# Patient Record
Sex: Male | Born: 1945 | Race: White | Hispanic: No | Marital: Married | Smoking: Never smoker
Health system: Southern US, Community
[De-identification: ages and names within clinical notes are randomized; demographics above are authoritative.]

## PROBLEM LIST (undated history)

## (undated) DIAGNOSIS — E785 Hyperlipidemia, unspecified: Secondary | ICD-10-CM

## (undated) DIAGNOSIS — R011 Cardiac murmur, unspecified: Secondary | ICD-10-CM

## (undated) DIAGNOSIS — Z87442 Personal history of urinary calculi: Secondary | ICD-10-CM

## (undated) DIAGNOSIS — M1990 Unspecified osteoarthritis, unspecified site: Secondary | ICD-10-CM

## (undated) DIAGNOSIS — G4730 Sleep apnea, unspecified: Secondary | ICD-10-CM

## (undated) DIAGNOSIS — N529 Male erectile dysfunction, unspecified: Secondary | ICD-10-CM

## (undated) DIAGNOSIS — M67919 Unspecified disorder of synovium and tendon, unspecified shoulder: Secondary | ICD-10-CM

## (undated) DIAGNOSIS — K219 Gastro-esophageal reflux disease without esophagitis: Secondary | ICD-10-CM

## (undated) DIAGNOSIS — E7800 Pure hypercholesterolemia, unspecified: Secondary | ICD-10-CM

## (undated) DIAGNOSIS — I10 Essential (primary) hypertension: Secondary | ICD-10-CM

## (undated) DIAGNOSIS — N200 Calculus of kidney: Secondary | ICD-10-CM

## (undated) DIAGNOSIS — M545 Low back pain, unspecified: Secondary | ICD-10-CM

## (undated) DIAGNOSIS — E669 Obesity, unspecified: Secondary | ICD-10-CM

## (undated) DIAGNOSIS — J45909 Unspecified asthma, uncomplicated: Secondary | ICD-10-CM

## (undated) DIAGNOSIS — R519 Headache, unspecified: Secondary | ICD-10-CM

## (undated) DIAGNOSIS — E119 Type 2 diabetes mellitus without complications: Secondary | ICD-10-CM

## (undated) DIAGNOSIS — R51 Headache: Secondary | ICD-10-CM

## (undated) DIAGNOSIS — E1149 Type 2 diabetes mellitus with other diabetic neurological complication: Secondary | ICD-10-CM

## (undated) DIAGNOSIS — R0600 Dyspnea, unspecified: Secondary | ICD-10-CM

## (undated) DIAGNOSIS — C801 Malignant (primary) neoplasm, unspecified: Secondary | ICD-10-CM

## (undated) HISTORY — DX: Low back pain, unspecified: M54.50

## (undated) HISTORY — DX: Obesity, unspecified: E66.9

## (undated) HISTORY — DX: Calculus of kidney: N20.0

## (undated) HISTORY — PX: KNEE ARTHROSCOPY: SUR90

## (undated) HISTORY — PX: PILONIDAL CYST EXCISION: SHX744

## (undated) HISTORY — DX: Low back pain: M54.5

## (undated) HISTORY — PX: COLONOSCOPY: SHX174

## (undated) HISTORY — DX: Type 2 diabetes mellitus with other diabetic neurological complication: E11.49

---

## 2002-01-30 ENCOUNTER — Ambulatory Visit (HOSPITAL_COMMUNITY): Admission: RE | Admit: 2002-01-30 | Discharge: 2002-01-30 | Payer: Self-pay | Admitting: Gastroenterology

## 2012-02-21 DIAGNOSIS — M899 Disorder of bone, unspecified: Secondary | ICD-10-CM | POA: Insufficient documentation

## 2012-02-21 DIAGNOSIS — R109 Unspecified abdominal pain: Secondary | ICD-10-CM | POA: Insufficient documentation

## 2012-02-21 DIAGNOSIS — N200 Calculus of kidney: Secondary | ICD-10-CM | POA: Insufficient documentation

## 2012-02-21 DIAGNOSIS — E119 Type 2 diabetes mellitus without complications: Secondary | ICD-10-CM | POA: Insufficient documentation

## 2012-02-21 DIAGNOSIS — R319 Hematuria, unspecified: Secondary | ICD-10-CM | POA: Insufficient documentation

## 2012-02-22 ENCOUNTER — Emergency Department (HOSPITAL_COMMUNITY)
Admission: EM | Admit: 2012-02-22 | Discharge: 2012-02-22 | Disposition: A | Discharge status: Home / Self Care | Payer: 59 | Attending: Emergency Medicine | Admitting: Emergency Medicine

## 2012-02-22 ENCOUNTER — Emergency Department (HOSPITAL_COMMUNITY): Payer: 59

## 2012-02-22 ENCOUNTER — Encounter (HOSPITAL_COMMUNITY): Payer: Self-pay | Admitting: Family Medicine

## 2012-02-22 DIAGNOSIS — N200 Calculus of kidney: Secondary | ICD-10-CM

## 2012-02-22 DIAGNOSIS — M899 Disorder of bone, unspecified: Secondary | ICD-10-CM

## 2012-02-22 LAB — URINE MICROSCOPIC-ADD ON

## 2012-02-22 LAB — URINALYSIS, ROUTINE W REFLEX MICROSCOPIC
Nitrite: POSITIVE — AB
Specific Gravity, Urine: 1.037 — ABNORMAL HIGH (ref 1.005–1.030)
Urobilinogen, UA: 1 mg/dL (ref 0.0–1.0)

## 2012-02-22 LAB — POCT I-STAT, CHEM 8
Calcium, Ion: 1.22 mmol/L (ref 1.12–1.32)
Hemoglobin: 13.3 g/dL (ref 13.0–17.0)
Sodium: 141 mEq/L (ref 135–145)
TCO2: 22 mmol/L (ref 0–100)

## 2012-02-22 LAB — GLUCOSE, CAPILLARY: Glucose-Capillary: 196 mg/dL — ABNORMAL HIGH (ref 70–99)

## 2012-02-22 MED ORDER — OXYCODONE-ACETAMINOPHEN 5-325 MG PO TABS
1.0000 | ORAL_TABLET | Freq: Once | ORAL | Status: AC
  Administered 2012-02-22: 1 via ORAL
  Filled 2012-02-22: qty 1

## 2012-02-22 MED ORDER — TAMSULOSIN HCL 0.4 MG PO CAPS: 0.4000 mg | ORAL_CAPSULE | Freq: Every day | ORAL | Status: DC

## 2012-02-22 MED ORDER — OXYCODONE-ACETAMINOPHEN 5-325 MG PO TABS: 1.0000 | ORAL_TABLET | Freq: Four times a day (QID) | ORAL | Status: AC | PRN

## 2012-02-22 NOTE — ED Notes (Signed)
Patient states he has blood in his urine since 8:30pm.

## 2012-02-22 NOTE — ED Notes (Signed)
Patient states that he was sitting on the couch watching TV and started having a sharp pain in his right lower pain. Went to the bathroom and saw blood in his urine. No history of kidney stone.

## 2012-02-22 NOTE — ED Provider Notes (Signed)
Medical screening examination/treatment/procedure(s) were performed by non-physician practitioner and as supervising physician I was immediately available for consultation/collaboration.   Hanley Seamen, MD 02/22/12 2156

## 2012-02-22 NOTE — Discharge Instructions (Signed)
You were seen and evaluated today for your symptoms and right flank pain and blood in your urine. Your CAT scan today showed that you have a 3 mm kidney stone causing a blockage and your pain. It is likely that you'll be old the past this kidney stone on your own. Continue to drink plenty of fluids and use pain medication that was prescribed to you as needed for severe pains and comfort. Do not drive or operate machinery while taking this pain medication as it may cause drowsiness. Your CAT scan today also showed possible slight lesions on your L2 and L5 vertebrae in your back. It is recommended that you have a repeat x-ray or CAT scan to followup on these lesions with your primary doctor. Please discuss these findings with your doctor. If you have continue pain or develop fever, chills, sweats from a kidney stone please followup with the urology specialist or return to the emergency room.   Kidney Stones Kidney stones (ureteral lithiasis) are deposits that form inside your kidneys. The intense pain is caused by the stone moving through the urinary tract. When the stone moves, the ureter goes into spasm around the stone. The stone is usually passed in the urine.  CAUSES   A disorder that makes certain neck glands produce too much parathyroid hormone (primary hyperparathyroidism).   A buildup of uric acid crystals.   Narrowing (stricture) of the ureter.   A kidney obstruction present at birth (congenital obstruction).   Previous surgery on the kidney or ureters.   Numerous kidney infections.  SYMPTOMS   Feeling sick to your stomach (nauseous).   Throwing up (vomiting).   Blood in the urine (hematuria).   Pain that usually spreads (radiates) to the groin.   Frequency or urgency of urination.  DIAGNOSIS   Taking a history and physical exam.   Blood or urine tests.   Computerized X-ray scan (CT scan).   Occasionally, an examination of the inside of the urinary bladder (cystoscopy) is  performed.  TREATMENT   Observation.   Increasing your fluid intake.   Surgery may be needed if you have severe pain or persistent obstruction.  The size, location, and chemical composition are all important variables that will determine the proper choice of action for you. Talk to your caregiver to better understand your situation so that you will minimize the risk of injury to yourself and your kidney.  HOME CARE INSTRUCTIONS   Drink enough water and fluids to keep your urine clear or pale yellow.   Strain all urine through the provided strainer. Keep all particulate matter and stones for your caregiver to see. The stone causing the pain may be as small as a grain of salt. It is very important to use the strainer each and every time you pass your urine. The collection of your stone will allow your caregiver to analyze it and verify that a stone has actually passed.   Only take over-the-counter or prescription medicines for pain, discomfort, or fever as directed by your caregiver.   Make a follow-up appointment with your caregiver as directed.   Get follow-up X-rays if required. The absence of pain does not always mean that the stone has passed. It may have only stopped moving. If the urine remains completely obstructed, it can cause loss of kidney function or even complete destruction of the kidney. It is your responsibility to make sure X-rays and follow-ups are completed. Ultrasounds of the kidney can show blockages and the status  of the kidney. Ultrasounds are not associated with any radiation and can be performed easily in a matter of minutes.  SEEK IMMEDIATE MEDICAL CARE IF:   Pain cannot be controlled with the prescribed medicine.   You have a fever.   The severity or intensity of pain increases over 18 hours and is not relieved by pain medicine.   You develop a new onset of abdominal pain.   You feel faint or pass out.  MAKE SURE YOU:   Understand these instructions.    Will watch your condition.   Will get help right away if you are not doing well or get worse.  Document Released: 11/26/2005 Document Revised: 11/15/2011 Document Reviewed: 03/24/2010 [facility] Patient Information 2012 [place], [place].

## 2012-02-22 NOTE — ED Provider Notes (Signed)
History     CSN: [id number]  Arrival date & time 02/21/12  2241   First MD Initiated Contact with Patient 02/22/12 0100      Chief Complaint  Patient presents with  . Hematuria  . Back Pain     HPI  History provided by the patient and wife. Patient is a 66 year old male with history of diabetes and obesity who presents with complaints of right flank pain and hematuria that began earlier this evening. He reports having slight discomfort in the flank earlier today the pain became significantly worse this evening. Patient denies any aggravating or alleviating factors the pain. Pain does not radiate. Pain is associated with slight nausea but no episodes of vomiting. Patient denies fever, chills, sweats. Patient denies any dysuria, testicular swelling or testicle pain. He denies any abdominal pain. Patient did not take anything for symptoms. Symptoms were described as moderate to severe. At this time patient reports pain has been gradually improving. Patient denies similar symptoms previously. Patient has no significant abdominal surgery history.   Past Medical History  Diagnosis Date  . Diabetes mellitus     Past Surgical History  Procedure Date  . Knee arthroscopy   . Pilonidal cyst excision     from base of spine    No family history on file.  History  Substance Use Topics  . Smoking status: Not on file  . Smokeless tobacco: Not on file  . Alcohol Use: 0.6 oz/week    1 Cans of beer per week     occasional      Review of Systems  Constitutional: Negative for fever, chills and appetite change.  Gastrointestinal: Negative for nausea, vomiting, abdominal pain, diarrhea and constipation.  Genitourinary: Positive for frequency, hematuria and flank pain. Negative for dysuria.  Skin: Negative for rash.  All other systems reviewed and are negative.    Allergies  Review of patient's allergies indicates no known allergies.  Home Medications  No current outpatient  prescriptions on file.  BP 170/70  Pulse 69  Temp(Src) 98.5 F (36.9 C) (Oral)  Resp 21  SpO2 99%  Physical Exam  Nursing note and vitals reviewed. Constitutional: He is oriented to person, place, and time. He appears well-developed and well-nourished. No distress.  HENT:  Head: Normocephalic.  Neck: Normal range of motion. Neck supple.       No meningeal signs  Cardiovascular: Normal rate and regular rhythm.   Pulmonary/Chest: Effort normal and breath sounds normal. No respiratory distress. He has no rales.  Abdominal: Soft. He exhibits no distension. There is no tenderness. There is no rebound and no guarding.       Mild right CVA tenderness.  Neurological: He is alert and oriented to person, place, and time.  Skin: Skin is warm.  Psychiatric: He has a normal mood and affect. His behavior is normal.    ED Course  Procedures   Results for orders placed during the hospital encounter of 02/22/12  URINALYSIS, ROUTINE W REFLEX MICROSCOPIC      Component Value Range   Color, Urine RED (*) YELLOW    APPearance TURBID (*) CLEAR    Specific Gravity, Urine 1.037 (*) 1.005 - 1.030    pH 5.0  5.0 - 8.0    Glucose, UA NEGATIVE  NEGATIVE (mg/dL)   Hgb urine dipstick LARGE (*) NEGATIVE    Bilirubin Urine LARGE (*) NEGATIVE    Ketones, ur 40 (*) NEGATIVE (mg/dL)   Protein, ur >469 (*) NEGATIVE (mg/dL)  Urobilinogen, UA 1.0  0.0 - 1.0 (mg/dL)   Nitrite POSITIVE (*) NEGATIVE    Leukocytes, UA MODERATE (*) NEGATIVE   GLUCOSE, CAPILLARY      Component Value Range   Glucose-Capillary 196 (*) 70 - 99 (mg/dL)  URINE MICROSCOPIC-ADD ON      Component Value Range   Squamous Epithelial / LPF RARE  RARE    WBC, UA 0-2  <3 (WBC/hpf)   RBC / HPF TOO NUMEROUS TO COUNT  <3 (RBC/hpf)   Bacteria, UA FEW (*) RARE    Urine-Other MICROSCOPIC EXAM PERFORMED ON UNCONCENTRATED URINE    POCT I-STAT, CHEM 8      Component Value Range   Sodium 141  135 - 145 (mEq/L)   Potassium 4.2  3.5 - 5.1  (mEq/L)   Chloride 109  96 - 112 (mEq/L)   BUN 20  6 - 23 (mg/dL)   Creatinine, Ser 1.61  0.50 - 1.35 (mg/dL)   Glucose, Bld 096 (*) 70 - 99 (mg/dL)   Calcium, Ion 0.45  4.09 - 1.32 (mmol/L)   TCO2 22  0 - 100 (mmol/L)   Hemoglobin 13.3  13.0 - 17.0 (g/dL)   HCT 81.1  91.4 - 78.2 (%)      Ct Abdomen Pelvis Wo Contrast  02/22/2012  *RADIOLOGY REPORT*  Clinical Data: Right flank and lower quadrant pain.  CT ABDOMEN AND PELVIS WITHOUT CONTRAST  Technique:  Multidetector CT imaging of the abdomen and pelvis was performed following the standard protocol without intravenous contrast.  Comparison: None.  Findings: Limited images through the lung bases demonstrate no significant appreciable abnormality. The heart size is within normal limits. No pleural or pericardial effusion.  Intra-abdominal organ evaluation is limited without intravenous contrast.  Within this limitation, unremarkable liver, biliary system, spleen, pancreas, adrenal glands.  1 cm interpolar left renal cyst.  No hydronephrosis or hydroureter on the left.  On the right, there is a right renal edema and perinephric fat stranding.  Mild pelvicaliectasis and hydroureter to the level of a 3 mm mid right ureteral stone.  Colonic diverticulosis without CT evidence for diverticulitis.  No bowel obstruction.  Normal appendix.  No free intraperitoneal air or fluid.  No lymphadenopathy.  There is scattered atherosclerotic calcification of the aorta and its branches. No aneurysmal dilatation.  Partially decompressed bladder.  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  L5 S1 degenerative disc disease. T12 vertebral body hemangioma.  6 mm lucency within the L5 vertebral body anteriorly and L2 vertebral body on the left anteriorly, without cortical breakthrough.  IMPRESSION: Mild right hydroureteronephrosis to the level of a 3 mm mid right ureteral stone.  There is perinephric fat stranding and right renal edema.  Subcentimeter lucency within the L5 vertebral body  and L2 vertebral body without cortical breakthrough.  The appearance is nonspecific with conditions such as myeloma not excluded. Comparison with outside imaging if available would be helpful to evaluate for stability.  Original Report Authenticated By: Waneta Martins, M.D.     1. Kidney stone   2. Bone lesion       MDM  Patient seen and evaluated. Patient in no acute distress. Patient does not complain of severe pains at this time and declined any pain medications.  Pt discussed with attending physician.  He agrees with plan.  Pt instructed of CT findings and dx.  Pt will follow up with PCP and urologist as needed.     Angus Seller, [place] 02/22/12 [phone]

## 2012-02-22 NOTE — ED Notes (Signed)
Patient transported to CT 

## 2012-02-23 LAB — URINE CULTURE

## 2012-02-24 NOTE — ED Notes (Signed)
+  Urine °Chart sent to EDP office for review; no sensitivities listed °

## 2012-02-25 NOTE — ED Notes (Addendum)
Call patient -is he having any urinary sx a has he f/u with urology yet? If not rx for amoxicillin 500 mg tid x 7 days  written by Grant Fontana.Needs to f/u with urology.

## 2012-02-25 NOTE — ED Notes (Signed)
Voicemail message left

## 2012-02-27 NOTE — ED Notes (Signed)
Patient notified... Pt says that he is doing well.

## 2014-09-19 ENCOUNTER — Emergency Department (HOSPITAL_COMMUNITY)
Admission: EM | Admit: 2014-09-19 | Discharge: 2014-09-20 | Disposition: A | Discharge status: Home / Self Care | Payer: 59 | Attending: Emergency Medicine | Admitting: Emergency Medicine

## 2014-09-19 ENCOUNTER — Encounter (HOSPITAL_COMMUNITY): Payer: Self-pay | Admitting: Emergency Medicine

## 2014-09-19 ENCOUNTER — Emergency Department (HOSPITAL_COMMUNITY): Payer: 59

## 2014-09-19 DIAGNOSIS — I10 Essential (primary) hypertension: Secondary | ICD-10-CM | POA: Insufficient documentation

## 2014-09-19 DIAGNOSIS — E119 Type 2 diabetes mellitus without complications: Secondary | ICD-10-CM | POA: Diagnosis not present

## 2014-09-19 DIAGNOSIS — R079 Chest pain, unspecified: Secondary | ICD-10-CM | POA: Diagnosis present

## 2014-09-19 DIAGNOSIS — M549 Dorsalgia, unspecified: Secondary | ICD-10-CM | POA: Insufficient documentation

## 2014-09-19 DIAGNOSIS — IMO0001 Reserved for inherently not codable concepts without codable children: Secondary | ICD-10-CM

## 2014-09-19 DIAGNOSIS — Z79899 Other long term (current) drug therapy: Secondary | ICD-10-CM | POA: Diagnosis not present

## 2014-09-19 DIAGNOSIS — E785 Hyperlipidemia, unspecified: Secondary | ICD-10-CM | POA: Diagnosis not present

## 2014-09-19 DIAGNOSIS — K219 Gastro-esophageal reflux disease without esophagitis: Secondary | ICD-10-CM | POA: Insufficient documentation

## 2014-09-19 DIAGNOSIS — E663 Overweight: Secondary | ICD-10-CM | POA: Insufficient documentation

## 2014-09-19 DIAGNOSIS — Z7982 Long term (current) use of aspirin: Secondary | ICD-10-CM | POA: Diagnosis not present

## 2014-09-19 HISTORY — DX: Hyperlipidemia, unspecified: E78.5

## 2014-09-19 HISTORY — DX: Essential (primary) hypertension: I10

## 2014-09-19 LAB — CBC
HEMATOCRIT: 41 % (ref 39.0–52.0)
HEMOGLOBIN: 14 g/dL (ref 13.0–17.0)
MCH: 30.2 pg (ref 26.0–34.0)
MCHC: 34.1 g/dL (ref 30.0–36.0)
MCV: 88.6 fL (ref 78.0–100.0)
Platelets: 255 10*3/uL (ref 150–400)
RBC: 4.63 MIL/uL (ref 4.22–5.81)
RDW: 12.9 % (ref 11.5–15.5)
WBC: 7.7 10*3/uL (ref 4.0–10.5)

## 2014-09-19 LAB — BASIC METABOLIC PANEL
Anion gap: 14 (ref 5–15)
BUN: 16 mg/dL (ref 6–23)
CO2: 26 mEq/L (ref 19–32)
Calcium: 9.5 mg/dL (ref 8.4–10.5)
Chloride: 100 mEq/L (ref 96–112)
Creatinine, Ser: 0.94 mg/dL (ref 0.50–1.35)
GFR calc Af Amer: >90 mL/min (ref >90)
GFR, EST NON AFRICAN AMERICAN: 85 mL/min — AB (ref >90)
GLUCOSE: 255 mg/dL — AB (ref 70–99)
POTASSIUM: 4.8 meq/L (ref 3.7–5.3)
Sodium: 140 mEq/L (ref 137–147)

## 2014-09-19 LAB — I-STAT TROPONIN, ED: Troponin i, poc: 0 ng/mL (ref 0.00–0.08)

## 2014-09-19 MED ORDER — PANTOPRAZOLE SODIUM 40 MG PO TBEC
40.0000 mg | DELAYED_RELEASE_TABLET | Freq: Once | ORAL | Status: AC
  Administered 2014-09-19: 40 mg via ORAL
  Filled 2014-09-19: qty 1

## 2014-09-19 MED ORDER — GI COCKTAIL ~~LOC~~
30.0000 mL | Freq: Once | ORAL | Status: AC
  Administered 2014-09-19: 30 mL via ORAL
  Filled 2014-09-19: qty 30

## 2014-09-19 NOTE — ED Provider Notes (Signed)
CSN: [id number]     Arrival date & time 09/19/14  2131 History   First MD Initiated Contact with Patient 09/19/14 2303     Chief Complaint  Patient presents with  . Back Pain  . Headache  . Indigestion   . Chest Pain     (Consider location/radiation/quality/duration/timing/severity/associated sxs/prior Treatment) HPI  This is a 68 year old male with history of diabetes, hypertension, and hyperlipidemia who presents with nausea and back pain. Patient reports that at lunch today he had onset of feeling flushed with associated nausea and burning pain in his back. He states he "might have a little chest pain at the time." Symptoms improved but he had recurrence of pain following dinner tonight at approximately 8 PM. He reports continued pain between her shoulder blades. Denies any chest pain, shortness of breath, sweating. Denies any abdominal pain, nausea, or vomiting. Reports his pain right now is 1-2/10. No history of reflux. Does state that he had similar pain after he went to bed last night and "couldn't get comfortable."  Patient reports "belching a lot tonight." Denies any fevers or cough.  No known history of coronary artery disease.  Past Medical History  Diagnosis Date  . Diabetes mellitus   . Hypertension   . Hyperlipemia    Past Surgical History  Procedure Laterality Date  . Knee arthroscopy    . Pilonidal cyst excision      from base of spine   History reviewed. No pertinent family history. History  Substance Use Topics  . Smoking status: Never Smoker   . Smokeless tobacco: Never Used  . Alcohol Use: 0.6 oz/week    1 Cans of beer per week     Comment: occasional    Review of Systems  Constitutional: Negative.  Negative for fever.  Respiratory: Negative.  Negative for chest tightness and shortness of breath.   Cardiovascular: Negative.  Negative for chest pain.  Gastrointestinal: Positive for nausea. Negative for vomiting, abdominal pain and diarrhea.   Genitourinary: Negative.  Negative for dysuria.  Musculoskeletal: Positive for back pain.  Skin: Negative for rash.  Neurological: Negative for headaches.  All other systems reviewed and are negative.     Allergies  Review of patient's allergies indicates no known allergies.  Home Medications   Prior to Admission medications   Medication Sig Start Date End Date Taking? Authorizing Provider  aspirin (ECOTRIN LOW STRENGTH) 81 MG EC tablet Take 81 mg by mouth daily. Swallow whole.   Yes Historical Provider, MD  cetirizine (ZYRTEC) 10 MG tablet Take 10 mg by mouth daily.   Yes Historical Provider, MD  furosemide (LASIX) 20 MG tablet Take 20 mg by mouth daily.   Yes Historical Provider, MD  glimepiride (AMARYL) 4 MG tablet Take 4 mg by mouth 2 (two) times daily.   Yes Historical Provider, MD  losartan (COZAAR) 100 MG tablet Take 100 mg by mouth daily.   Yes Historical Provider, MD  Misc Natural Products (OSTEO BI-FLEX ADV DOUBLE ST PO) Take 2 tablets by mouth daily.   Yes Historical Provider, MD  pioglitazone-metformin (ACTOPLUS MET) 15-850 MG per tablet Take 1 tablet by mouth 2 (two) times daily with a meal.   Yes Historical Provider, MD  pioglitazone-metformin (ACTOPLUS MET) 15-850 MG per tablet Take 1 tablet by mouth daily with breakfast.   Yes Historical Provider, MD  simvastatin (ZOCOR) 20 MG tablet Take 20 mg by mouth daily.   Yes Historical Provider, MD  omeprazole (PRILOSEC) 20 MG capsule Take 1  capsule (20 mg total) by mouth daily. 09/20/14   Merryl Hacker, MD   BP 160/53  Pulse 76  Temp(Src) 99.1 F (37.3 C) (Oral)  Resp 18  Ht 6' (1.829 m)  Wt 287 lb 4.8 oz (130.318 kg)  BMI 38.96 kg/m2  SpO2 98% Physical Exam  Nursing note and vitals reviewed. Constitutional: He is oriented to person, place, and time. He appears well-developed and well-nourished. No distress.  Overweight  HENT:  Head: Normocephalic and atraumatic.  Mouth/Throat: Oropharynx is clear and moist.   Eyes: Pupils are equal, round, and reactive to light.  Cardiovascular: Normal rate, regular rhythm and normal heart sounds.   No murmur heard. Pulmonary/Chest: Effort normal and breath sounds normal. No respiratory distress. He has no wheezes.  Abdominal: Soft. Bowel sounds are normal. There is no tenderness. There is no rebound.  Musculoskeletal: He exhibits no edema.  Neurological: He is alert and oriented to person, place, and time.  Skin: Skin is warm and dry.  Psychiatric: He has a normal mood and affect.    ED Course  Procedures (including critical care time) Labs Review Labs Reviewed  BASIC METABOLIC PANEL - Abnormal; Notable for the following:    Glucose, Bld 255 (*)    GFR calc non Af Amer 85 (*)    All other components within normal limits  CBC  I-STAT TROPOININ, ED    Imaging Review Dg Chest 2 View  09/19/2014   CLINICAL DATA:  Chest tightness, back pain, shoulder blade pain, headache beginning today.  EXAM: CHEST  2 VIEW  COMPARISON:  None.  FINDINGS: Cardiomediastinal silhouette is unremarkable. Minimal bibasilar linear densities. The lungs are otherwise clear without pleural effusions or focal consolidations. Trachea projects midline and there is no pneumothorax. Soft tissue planes and included osseous structures are non-suspicious. Mild degenerative change of the thoracic spine.  IMPRESSION: Minimal bibasilar atelectasis versus scarring.   Electronically Signed   By: Elon Alas   On: 09/19/2014 23:13     EKG Interpretation   Date/Time:  Sunday September 19 2014 21:53:57 EDT Ventricular Rate:  75 PR Interval:  182 QRS Duration: 98 QT Interval:  390 QTC Calculation: 435 R Axis:   -33 Text Interpretation:  Normal sinus rhythm Left axis deviation Abnormal ECG  No prior for comparison Confirmed by HORTON  MD, Loma Sousa ([id number]) on  09/19/2014 11:04:11 PM      MDM   Final diagnoses:  Chest pain, unspecified chest pain type  Reflux   Patient presents  with nausea and back pain following ingestion of food. Also reports that he may have had some chest tightness during that time. His symptoms have resolved. History is most suggestive of esophageal reflux. Patient does have risk factors for ACS including diabetes, hypertension, and hyperlipidemia. EKG shows no ischemic changes and chest x-ray is reassuring. Patient was given a GI cocktail and Protonix with improvement of his back pain and nausea symptoms.  While patient's heart score is 69 (age and risk factors), patient's history is very atypical for ACS and much more suggestive of GI symptoms.  He has had no chest pain while here.  Given he had an episode at lunch, with suspected if this is cardiac etiology his troponin would be elevated.  Discussed with patient and his wife treatment with omeprazole.  Given risk factors for ACS, also feel patient would benefit from close followup with cardiology for functional stress testing as he has not had a stress test. Patient was given strict return  precautions and stated that he would followup with his primary physician and cardiology this week.  After history, exam, and medical workup I feel the patient has been appropriately medically screened and is safe for discharge home. Pertinent diagnoses were discussed with the patient. Patient was given return precautions.     Merryl Hacker, MD 09/20/14 [phone]

## 2014-09-19 NOTE — ED Notes (Signed)
Patient states his father passed away at the age of 13 from a heart attack

## 2014-09-19 NOTE — ED Notes (Signed)
Patient presents stating that earlier today he was eating and got a hot feeling and some nausea.  This went away then started with back pain..  Started with headache and the back pain continues

## 2014-09-20 MED ORDER — OMEPRAZOLE 20 MG PO CPDR: 20.0000 mg | DELAYED_RELEASE_CAPSULE | Freq: Every day | ORAL | Status: DC

## 2014-09-20 NOTE — Discharge Instructions (Signed)
You were evaluated today for back pain and nausea feeling. Suspect he has some element of gastroesophageal reflux given your history and improvement with GI medications. However, I cannot fully rule out heart disease in the emergency room.  Your cardiac workup is reassuring. However, given your age and risk factors, with heavy followup closely with cardiology for functional stress testing. If you have any new or worsening symptoms, onset of chest pain, shortness of breath, or any other symptoms, you should be evaluated immediately.  Gastroesophageal Reflux Disease, Adult Gastroesophageal reflux disease (GERD) happens when acid from your stomach flows up into the esophagus. When acid comes in contact with the esophagus, the acid causes soreness (inflammation) in the esophagus. Over time, GERD may create small holes (ulcers) in the lining of the esophagus. CAUSES   Increased body weight. This puts pressure on the stomach, making acid rise from the stomach into the esophagus.  Smoking. This increases acid production in the stomach.  Drinking alcohol. This causes decreased pressure in the lower esophageal sphincter (valve or ring of muscle between the esophagus and stomach), allowing acid from the stomach into the esophagus.  Late evening meals and a full stomach. This increases pressure and acid production in the stomach.  A malformed lower esophageal sphincter. Sometimes, no cause is found. SYMPTOMS   Burning pain in the lower part of the mid-chest behind the breastbone and in the mid-stomach area. This may occur twice a week or more often.  Trouble swallowing.  Sore throat.  Dry cough.  Asthma-like symptoms including chest tightness, shortness of breath, or wheezing. DIAGNOSIS  Your caregiver may be able to diagnose GERD based on your symptoms. In some cases, X-rays and other tests may be done to check for complications or to check the condition of your stomach and esophagus. TREATMENT    Your caregiver may recommend over-the-counter or prescription medicines to help decrease acid production. Ask your caregiver before starting or adding any new medicines.  HOME CARE INSTRUCTIONS   Change the factors that you can control. Ask your caregiver for guidance concerning weight loss, quitting smoking, and alcohol consumption.  Avoid foods and drinks that make your symptoms worse, such as:  Caffeine or alcoholic drinks.  Chocolate.  Peppermint or mint flavorings.  Garlic and onions.  Spicy foods.  Citrus fruits, such as oranges, lemons, or limes.  Tomato-based foods such as sauce, chili, salsa, and pizza.  Fried and fatty foods.  Avoid lying down for the 3 hours prior to your bedtime or prior to taking a nap.  Eat small, frequent meals instead of large meals.  Wear loose-fitting clothing. Do not wear anything tight around your waist that causes pressure on your stomach.  Raise the head of your bed 6 to 8 inches with wood blocks to help you sleep. Extra pillows will not help.  Only take over-the-counter or prescription medicines for pain, discomfort, or fever as directed by your caregiver.  Do not take aspirin, ibuprofen, or other nonsteroidal anti-inflammatory drugs (NSAIDs). SEEK IMMEDIATE MEDICAL CARE IF:   You have pain in your arms, neck, jaw, teeth, or back.  Your pain increases or changes in intensity or duration.  You develop nausea, vomiting, or sweating (diaphoresis).  You develop shortness of breath, or you faint.  Your vomit is green, yellow, black, or looks like coffee grounds or blood.  Your stool is red, bloody, or black. These symptoms could be signs of other problems, such as heart disease, gastric bleeding, or esophageal bleeding. MAKE  SURE YOU:   Understand these instructions.  Will watch your condition.  Will get help right away if you are not doing well or get worse. Document Released: 09/05/2005 Document Revised: 02/18/2012 Document  Reviewed: 06/15/2011 [facility] Patient Information 2015 [place], [place]. This information is not intended to replace advice given to you by your health care provider. Make sure you discuss any questions you have with your health care provider.

## 2014-09-29 ENCOUNTER — Ambulatory Visit: Payer: 59 | Admitting: Cardiovascular Disease

## 2014-10-04 ENCOUNTER — Encounter: Payer: Self-pay | Admitting: Cardiology

## 2014-10-04 ENCOUNTER — Ambulatory Visit (INDEPENDENT_AMBULATORY_CARE_PROVIDER_SITE_OTHER): Payer: 59 | Admitting: Cardiology

## 2014-10-04 VITALS — BP 149/69 | HR 78 | Ht 72.0 in | Wt 280.0 lb

## 2014-10-04 DIAGNOSIS — E1122 Type 2 diabetes mellitus with diabetic chronic kidney disease: Secondary | ICD-10-CM | POA: Insufficient documentation

## 2014-10-04 DIAGNOSIS — E1149 Type 2 diabetes mellitus with other diabetic neurological complication: Secondary | ICD-10-CM

## 2014-10-04 DIAGNOSIS — E785 Hyperlipidemia, unspecified: Secondary | ICD-10-CM

## 2014-10-04 DIAGNOSIS — E7800 Pure hypercholesterolemia, unspecified: Secondary | ICD-10-CM

## 2014-10-04 DIAGNOSIS — I10 Essential (primary) hypertension: Secondary | ICD-10-CM | POA: Insufficient documentation

## 2014-10-04 DIAGNOSIS — R0789 Other chest pain: Secondary | ICD-10-CM

## 2014-10-04 DIAGNOSIS — E1140 Type 2 diabetes mellitus with diabetic neuropathy, unspecified: Secondary | ICD-10-CM

## 2014-10-04 DIAGNOSIS — E1129 Type 2 diabetes mellitus with other diabetic kidney complication: Secondary | ICD-10-CM | POA: Insufficient documentation

## 2014-10-04 DIAGNOSIS — E1169 Type 2 diabetes mellitus with other specified complication: Secondary | ICD-10-CM | POA: Insufficient documentation

## 2014-10-04 DIAGNOSIS — R011 Cardiac murmur, unspecified: Secondary | ICD-10-CM

## 2014-10-04 HISTORY — DX: Type 2 diabetes mellitus with other diabetic neurological complication: E11.49

## 2014-10-04 NOTE — Progress Notes (Signed)
Joshua Koch Date of Birth: 06-10-1946 Medical Record #[id number]  History of Present Illness: Joshua Koch is seen as a new patient today for evaluation of chest pain. He is a pleasant 68 yo WM with multiple cardiac risk factors including DM type 2 with neuropathy, HTN, hyperlipidemia, and family history of CAD. He recently presented to the ED with symptoms of pain in his mid back between his shoulder blades. This was associated with chest pressure and flushing. Ecg and CXR were unremarkable. Labs were OK. He is limited in activity due to knee problems. He has never had cardiac evaluation before.     Medication List       This list is accurate as of: 10/04/14  3:11 PM.  Always use your most recent med list.               cetirizine 10 MG tablet  Commonly known as:  ZYRTEC  Take 10 mg by mouth daily.     ECOTRIN LOW STRENGTH 81 MG EC tablet  Generic drug:  aspirin  Take 81 mg by mouth daily. Swallow whole.     furosemide 20 MG tablet  Commonly known as:  LASIX  Take 20 mg by mouth daily.     glimepiride 4 MG tablet  Commonly known as:  AMARYL  Take 4 mg by mouth 2 (two) times daily.     losartan 100 MG tablet  Commonly known as:  COZAAR  Take 100 mg by mouth daily.     OSTEO BI-FLEX ADV DOUBLE ST PO  Take 2 tablets by mouth daily.     OSTEO BI-FLEX ADV JOINT SHIELD PO  Take 2 tablets by mouth daily.     pioglitazone-metformin 15-850 MG per tablet  Commonly known as:  ACTOPLUS MET  Take 1 tablet by mouth 2 (two) times daily with a meal.     pioglitazone-metformin 15-850 MG per tablet  Commonly known as:  ACTOPLUS MET  Take 1 tablet by mouth daily with breakfast.     simvastatin 20 MG tablet  Commonly known as:  ZOCOR  Take 20 mg by mouth daily.        No Known Allergies  Past Medical History  Diagnosis Date  . Diabetes mellitus   . Hypertension   . Hyperlipemia   . Obesity   . Low back pain   . Kidney stone   . Diabetes mellitus type 2 with  neurological manifestations 10/04/2014    Past Surgical History  Procedure Laterality Date  . Knee arthroscopy    . Pilonidal cyst excision      from base of spine    History   Social History  . Marital Status: Married    Spouse Name: N/A    Number of Children: 2  . Years of Education: N/A   Occupational History  . makes cutting dyes    Social History Main Topics  . Smoking status: Never Smoker   . Smokeless tobacco: Never Used  . Alcohol Use: 0.6 oz/week    1 Cans of beer per week     Comment: occasional  . Drug Use: No  . Sexual Activity: Yes   Other Topics Concern  . None   Social History Narrative  . None    Family History  Problem Relation Age of Onset  . Heart attack Father 50    MI  He has 8 siblings. 3 have died from cancer related to tobacco use.  Review of Systems: The  review of systems is positive for chronic numbness in his feet.   All other systems were reviewed and are negative.  Physical Exam: BP 149/69  Pulse 78  Ht 6' (1.829 m)  Wt 280 lb (127.007 kg)  BMI 37.97 kg/m2 Filed Weights   10/04/14 1439  Weight: 280 lb (127.007 kg)  GENERAL:  Well appearing, obese WM in NAD HEENT:  PERRL, EOMI, sclera are clear. Oropharynx is clear. NECK:  No jugular venous distention, carotid upstroke brisk and symmetric, no bruits, no thyromegaly or adenopathy LUNGS:  Clear to auscultation bilaterally CHEST:  Unremarkable HEART:  RRR,  PMI not displaced or sustained,S1 and S2 within normal limits, no S3, no S4: there is a grade 0-3/0 systolic murmur at the apex radiating to the LSB. ABD:  Soft, nontender. BS +, no masses or bruits. No hepatomegaly, no splenomegaly EXT:  2 + pulses throughout, no edema, no cyanosis no clubbing SKIN:  Warm and dry.  No rashes NEURO:  Alert and oriented x 3. Cranial nerves II through XII intact. PSYCH:  Cognitively intact    LABORATORY DATA: CHEST 2 VIEW  COMPARISON: None.  FINDINGS:  Cardiomediastinal silhouette is  unremarkable. Minimal bibasilar  linear densities. The lungs are otherwise clear without pleural  effusions or focal consolidations. Trachea projects midline and  there is no pneumothorax. Soft tissue planes and included osseous  structures are non-suspicious. Mild degenerative change of the  thoracic spine.  IMPRESSION:  Minimal bibasilar atelectasis versus scarring.  Electronically Signed  By: Elon Alas  On: 09/19/2014 23:13   Ecg: 09/20/14 showed NSR with LAD. Otherwise normal. I have personally reviewed and interpreted this study.  Lab Results  Component Value Date   WBC 7.7 09/19/2014   HGB 14.0 09/19/2014   HCT 41.0 09/19/2014   PLT 255 09/19/2014   GLUCOSE 255* 09/19/2014   NA 140 09/19/2014   K 4.8 09/19/2014   CL 100 09/19/2014   CREATININE 0.94 09/19/2014   BUN 16 09/19/2014   CO2 26 09/19/2014     Assessment / Plan: 1. Atypical chest pain in patient with multiple cardiac risk factors. He is at intermediate risk for CAD. Recommend Lexiscan myoview to further assess risk. Continue ASA 81 mg daily.   2. Heart murmur. 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 Doubt hemodynamically significant.  3. Hyperlipidemia. Continue statin.  4. DM type 2 with neuropathy.  5. HTN controlled.

## 2014-10-04 NOTE — Patient Instructions (Signed)
We will schedule you for a nuclear stress test   

## 2014-10-19 ENCOUNTER — Telehealth (HOSPITAL_COMMUNITY): Payer: Self-pay

## 2014-10-19 NOTE — Telephone Encounter (Signed)
Encounter complete. 

## 2014-10-20 ENCOUNTER — Telehealth (HOSPITAL_COMMUNITY): Payer: Self-pay

## 2014-10-20 NOTE — Telephone Encounter (Signed)
Encounter complete. 

## 2014-10-21 ENCOUNTER — Encounter (HOSPITAL_COMMUNITY): Payer: 59

## 2014-10-22 ENCOUNTER — Encounter (HOSPITAL_COMMUNITY): Payer: 59

## 2014-11-11 ENCOUNTER — Ambulatory Visit (HOSPITAL_COMMUNITY)
Admission: RE | Admit: 2014-11-11 | Discharge: 2014-11-11 | Disposition: A | Discharge status: Home / Self Care | Payer: 59 | Source: Ambulatory Visit | Attending: Cardiovascular Disease | Admitting: Cardiovascular Disease

## 2014-11-11 DIAGNOSIS — R011 Cardiac murmur, unspecified: Secondary | ICD-10-CM

## 2014-11-11 DIAGNOSIS — R5383 Other fatigue: Secondary | ICD-10-CM | POA: Diagnosis not present

## 2014-11-11 DIAGNOSIS — E785 Hyperlipidemia, unspecified: Secondary | ICD-10-CM | POA: Diagnosis not present

## 2014-11-11 DIAGNOSIS — I10 Essential (primary) hypertension: Secondary | ICD-10-CM | POA: Diagnosis not present

## 2014-11-11 DIAGNOSIS — Z8249 Family history of ischemic heart disease and other diseases of the circulatory system: Secondary | ICD-10-CM | POA: Diagnosis not present

## 2014-11-11 DIAGNOSIS — E669 Obesity, unspecified: Secondary | ICD-10-CM | POA: Diagnosis not present

## 2014-11-11 DIAGNOSIS — R0789 Other chest pain: Secondary | ICD-10-CM

## 2014-11-11 DIAGNOSIS — E1149 Type 2 diabetes mellitus with other diabetic neurological complication: Secondary | ICD-10-CM

## 2014-11-11 DIAGNOSIS — E119 Type 2 diabetes mellitus without complications: Secondary | ICD-10-CM | POA: Insufficient documentation

## 2014-11-11 DIAGNOSIS — E7800 Pure hypercholesterolemia, unspecified: Secondary | ICD-10-CM

## 2014-11-11 DIAGNOSIS — R079 Chest pain, unspecified: Secondary | ICD-10-CM | POA: Insufficient documentation

## 2014-11-11 MED ORDER — REGADENOSON 0.4 MG/5ML IV SOLN
0.4000 mg | Freq: Once | INTRAVENOUS | Status: AC
  Administered 2014-11-11: 0.4 mg via INTRAVENOUS

## 2014-11-11 MED ORDER — TECHNETIUM TC 99M SESTAMIBI GENERIC - CARDIOLITE
31.0000 | Freq: Once | INTRAVENOUS | Status: AC | PRN
Start: 2014-11-11 — End: 2014-11-11
  Administered 2014-11-11: 31 via INTRAVENOUS

## 2014-11-11 NOTE — Procedures (Addendum)
[facility] [address] [place] [address] [phone]  Cardiology Nuclear Med Study  Joshua Koch is a 68 y.o. male     MRN : [id number]     DOB: 09-28-1946  Procedure Date: 11/11/2014  Nuclear Med Background Indication for Stress Test:  Evaluation for Ischemia History:  Asthma;Murmur;No prior NUC MPI for comparison. Cardiac Risk Factors: Family History - CAD, Hypertension, Lipids, NIDDM and Obesity  Symptoms:  Chest Pain and Fatigue   Nuclear Pre-Procedure Caffeine/Decaff Intake:  1:00am NPO After: 11am   IV Site: R Forearm  IV 0.9% NS with Angio Cath:  22g  Chest Size (in):  52"  IV Started by: Rolene Course, RN  Height: 6' (1.829 m)  Cup Size: n/a  BMI:  Body mass index is 37.97 kg/(m^2). Weight:  280 lb (127.007 kg)   Tech Comments:  n/a    Nuclear Med Study 1 or 2 day study: 2 day  Stress Test Type:  [place] Provider:  Peter Martinique, MD   Resting Radionuclide: Technetium 74m Sestamibi  Resting Radionuclide Dose: 30.6 mCi   Stress Radionuclide:  Technetium 36m Sestamibi  Stress Radionuclide Dose: 31.0 mCi           Stress Protocol Rest HR: 73 Stress HR: 91  Rest BP: 138/67 Stress BP:155/53  Exercise Time (min): n/a METS: n/a          Dose of Adenosine (mg):  n/a Dose of Lexiscan: 0.4 mg  Dose of Atropine (mg): n/a Dose of Dobutamine: n/a mcg/kg/min (at max HR)  Stress Test Technologist: Mellody Memos, CCT Nuclear Technologist: Imagene Riches, CNMT   Rest Procedure:  Myocardial perfusion imaging was performed at rest 45 minutes following the intravenous administration of Technetium 49m Sestamibi. Stress Procedure:  The patient received IV Lexiscan 0.4 mg over 15-seconds.  Technetium 81m Sestamibi injected IV at 30-seconds.  There were no significant changes with Lexiscan.  Quantitative spect images were obtained after a 45 minute delay.  Transient Ischemic Dilatation (Normal  <1.22):  1.10  QGS EDV:  133 ml QGS ESV:  59 ml LV Ejection Fraction: 56%   Rest ECG: NSR - Normal EKG  Stress ECG: No significant change from baseline ECG  QPS Raw Data Images:  Mild diaphragmatic attenuation.  Normal left ventricular size. Stress Images:  Normal homogeneous uptake in all areas of the myocardium. Rest Images:  Normal homogeneous uptake in all areas of the myocardium. Subtraction (SDS):  No evidence of ischemia.  Impression Exercise Capacity:  Lexiscan with no exercise. BP Response:  Normal blood pressure response. Clinical Symptoms:  No symptoms. ECG Impression:  No significant ECG changes with Lexiscan. Comparison with Prior Nuclear Study: No previous nuclear study performed  Overall Impression:  Normal stress nuclear study.  LV Wall Motion:  NL LV Function; NL Wall Motion; EF 56%  Pixie Casino, MD, [facility] Board Certified in Nuclear Cardiology Attending Cardiologist [place], MD  11/12/2014 3:03 PM

## 2014-11-12 ENCOUNTER — Ambulatory Visit (HOSPITAL_COMMUNITY)
Admission: RE | Admit: 2014-11-12 | Discharge: 2014-11-12 | Disposition: A | Discharge status: Home / Self Care | Payer: 59 | Source: Ambulatory Visit | Attending: Internal Medicine | Admitting: Internal Medicine

## 2014-11-12 DIAGNOSIS — R5383 Other fatigue: Secondary | ICD-10-CM | POA: Insufficient documentation

## 2014-11-12 DIAGNOSIS — E669 Obesity, unspecified: Secondary | ICD-10-CM | POA: Insufficient documentation

## 2014-11-12 DIAGNOSIS — R0789 Other chest pain: Secondary | ICD-10-CM

## 2014-11-12 DIAGNOSIS — Z8249 Family history of ischemic heart disease and other diseases of the circulatory system: Secondary | ICD-10-CM | POA: Diagnosis not present

## 2014-11-12 DIAGNOSIS — I10 Essential (primary) hypertension: Secondary | ICD-10-CM | POA: Insufficient documentation

## 2014-11-12 DIAGNOSIS — R079 Chest pain, unspecified: Secondary | ICD-10-CM | POA: Diagnosis present

## 2014-11-12 DIAGNOSIS — E119 Type 2 diabetes mellitus without complications: Secondary | ICD-10-CM | POA: Diagnosis not present

## 2014-11-12 DIAGNOSIS — E785 Hyperlipidemia, unspecified: Secondary | ICD-10-CM | POA: Diagnosis not present

## 2014-11-12 MED ORDER — TECHNETIUM TC 99M SESTAMIBI GENERIC - CARDIOLITE
30.6000 | Freq: Once | INTRAVENOUS | Status: AC | PRN
  Administered 2014-11-12: 31 via INTRAVENOUS

## 2015-04-25 ENCOUNTER — Ambulatory Visit (HOSPITAL_COMMUNITY)
Admission: RE | Admit: 2015-04-25 | Discharge: 2015-04-25 | Disposition: A | Discharge status: Home / Self Care | Payer: 59 | Source: Ambulatory Visit | Attending: Orthopedic Surgery | Admitting: Orthopedic Surgery

## 2015-04-25 ENCOUNTER — Other Ambulatory Visit (HOSPITAL_COMMUNITY): Payer: Self-pay | Admitting: Orthopedic Surgery

## 2015-04-25 DIAGNOSIS — M545 Low back pain: Secondary | ICD-10-CM

## 2015-04-25 DIAGNOSIS — T1590XA Foreign body on external eye, part unspecified, unspecified eye, initial encounter: Secondary | ICD-10-CM | POA: Diagnosis not present

## 2015-04-25 DIAGNOSIS — X58XXXA Exposure to other specified factors, initial encounter: Secondary | ICD-10-CM | POA: Insufficient documentation

## 2016-05-03 ENCOUNTER — Emergency Department (HOSPITAL_COMMUNITY)
Admission: EM | Admit: 2016-05-03 | Discharge: 2016-05-03 | Disposition: A | Discharge status: Home / Self Care | Payer: 59 | Attending: Emergency Medicine | Admitting: Emergency Medicine

## 2016-05-03 ENCOUNTER — Emergency Department (HOSPITAL_COMMUNITY): Payer: 59

## 2016-05-03 ENCOUNTER — Encounter (HOSPITAL_COMMUNITY): Payer: Self-pay | Admitting: Emergency Medicine

## 2016-05-03 DIAGNOSIS — Z79899 Other long term (current) drug therapy: Secondary | ICD-10-CM | POA: Diagnosis not present

## 2016-05-03 DIAGNOSIS — E785 Hyperlipidemia, unspecified: Secondary | ICD-10-CM | POA: Diagnosis not present

## 2016-05-03 DIAGNOSIS — E669 Obesity, unspecified: Secondary | ICD-10-CM | POA: Diagnosis not present

## 2016-05-03 DIAGNOSIS — I10 Essential (primary) hypertension: Secondary | ICD-10-CM | POA: Diagnosis not present

## 2016-05-03 DIAGNOSIS — E1149 Type 2 diabetes mellitus with other diabetic neurological complication: Secondary | ICD-10-CM | POA: Diagnosis not present

## 2016-05-03 DIAGNOSIS — R079 Chest pain, unspecified: Secondary | ICD-10-CM | POA: Diagnosis present

## 2016-05-03 DIAGNOSIS — Z87442 Personal history of urinary calculi: Secondary | ICD-10-CM | POA: Insufficient documentation

## 2016-05-03 DIAGNOSIS — Z7982 Long term (current) use of aspirin: Secondary | ICD-10-CM | POA: Insufficient documentation

## 2016-05-03 LAB — BASIC METABOLIC PANEL
Anion gap: 7 (ref 5–15)
BUN: 14 mg/dL (ref 6–20)
CHLORIDE: 110 mmol/L (ref 101–111)
CO2: 20 mmol/L — ABNORMAL LOW (ref 22–32)
Calcium: 9.4 mg/dL (ref 8.9–10.3)
Creatinine, Ser: 0.94 mg/dL (ref 0.61–1.24)
GFR calc Af Amer: >60 mL/min (ref >60)
Glucose, Bld: 78 mg/dL (ref 65–99)
POTASSIUM: 5 mmol/L (ref 3.5–5.1)
SODIUM: 137 mmol/L (ref 135–145)

## 2016-05-03 LAB — CBC
HEMATOCRIT: 45.4 % (ref 39.0–52.0)
Hemoglobin: 14.8 g/dL (ref 13.0–17.0)
MCH: 29.8 pg (ref 26.0–34.0)
MCHC: 32.6 g/dL (ref 30.0–36.0)
MCV: 91.5 fL (ref 78.0–100.0)
PLATELETS: 246 10*3/uL (ref 150–400)
RBC: 4.96 MIL/uL (ref 4.22–5.81)
RDW: 13.6 % (ref 11.5–15.5)
WBC: 8.5 10*3/uL (ref 4.0–10.5)

## 2016-05-03 LAB — I-STAT TROPONIN, ED: Troponin i, poc: 0 ng/mL (ref 0.00–0.08)

## 2016-05-03 LAB — TROPONIN I

## 2016-05-03 NOTE — ED Provider Notes (Signed)
CSN: [id number]     Arrival date & time 05/03/16  1658 History   First MD Initiated Contact with Patient 05/03/16 1752     Chief Complaint  Patient presents with  . Chest Pain     (Consider location/radiation/quality/duration/timing/severity/associated sxs/prior Treatment) HPI  Pt presenting with c/o feeling a flushed sensation and tight feeling in his chest.  He began to check his blood pressure and it was elevated - systolic in 373S and 287G.  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  Vague chest discomfort- no pain.  No diaphoresis, no nausea, no radiation.  Symptoms occurred at approx 11am.  No leg swelling.  No difficulty breathing.  No headache or changes in speech and vision.  Pt currently has no symptoms.  There are no other associated systemic symptoms, there are no other alleviating or modifying factors.   Past Medical History  Diagnosis Date  . Diabetes mellitus   . Hypertension   . Hyperlipemia   . Obesity   . Low back pain   . Kidney stone   . Diabetes mellitus type 2 with neurological manifestations ([place]) 10/04/2014   Past Surgical History  Procedure Laterality Date  . Knee arthroscopy    . Pilonidal cyst excision      from base of spine   Family History  Problem Relation Age of Onset  . Heart attack Father 48    MI   Social History  Substance Use Topics  . Smoking status: Never Smoker   . Smokeless tobacco: Never Used  . Alcohol Use: 0.6 oz/week    1 Cans of beer per week     Comment: occasional    Review of Systems  ROS reviewed and all otherwise negative except for mentioned in HPI    Allergies  Review of patient's allergies indicates no known allergies.  Home Medications   Prior to Admission medications   Medication Sig Start Date End Date Taking? Authorizing Provider  aspirin (ECOTRIN LOW STRENGTH) 81 MG EC tablet Take 81 mg by mouth daily. Swallow whole.    Historical Provider, MD  cetirizine  (ZYRTEC) 10 MG tablet Take 10 mg by mouth daily.    Historical Provider, MD  furosemide (LASIX) 20 MG tablet Take 20 mg by mouth daily.    Historical Provider, MD  glimepiride (AMARYL) 4 MG tablet Take 4 mg by mouth 2 (two) times daily.    Historical Provider, MD  losartan (COZAAR) 100 MG tablet Take 100 mg by mouth daily.    Historical Provider, MD  Misc Natural Products (OSTEO BI-FLEX ADV DOUBLE ST PO) Take 2 tablets by mouth daily.    Historical Provider, MD  Misc Natural Products (OSTEO BI-FLEX ADV JOINT SHIELD PO) Take 2 tablets by mouth daily. 09/24/09   Historical Provider, MD  pioglitazone-metformin (ACTOPLUS MET) 15-850 MG per tablet Take 1 tablet by mouth 2 (two) times daily with a meal.    Historical Provider, MD  pioglitazone-metformin (ACTOPLUS MET) 15-850 MG per tablet Take 1 tablet by mouth daily with breakfast.    Historical Provider, MD  simvastatin (ZOCOR) 20 MG tablet Take 20 mg by mouth daily.    Historical Provider, MD   BP 138/65 mmHg  Pulse 64  Temp(Src) 98.5 F (36.9 C) (Oral)  Resp 18  SpO2 99%  Vitals reviewed Physical Exam  Physical Examination: General appearance - alert, well appearing, and in no distress Mental status - alert, oriented to  person, place, and time Eyes -no conjunctival injection no scleral icterus Mouth - mucous membranes moist, pharynx normal without lesions Chest - clear to auscultation, no wheezes, rales or rhonchi, symmetric air entry Heart - normal rate, regular rhythm, normal S1, S2, no murmurs, rubs, clicks or gallops Abdomen - soft, nontender, nondistended, no masses or organomegaly Neurological - alert, oriented, normal speech Extremities - peripheral pulses normal, no pedal edema, no clubbing or cyanosis Skin - normal coloration and turgor, no rashes  ED Course  Procedures (including critical care time) Labs Review Labs Reviewed  BASIC METABOLIC PANEL - Abnormal; Notable for the following:    CO2 20 (*)    All other components  within normal limits  CBC  TROPONIN I  I-STAT TROPOININ, ED    Imaging Review Dg Chest 2 View  05/03/2016  CLINICAL DATA:  Central chest pain with dull headache and elevated blood pressure today. Abnormal EKG. History of hypertension and diabetes. EXAM: CHEST  2 VIEW COMPARISON:  09/19/2014. FINDINGS: The heart size and mediastinal contours are stable. There are stable linear densities anteriorly at the lung bases on the lateral view, consistent with scarring or atelectasis. The lungs are otherwise clear. There is no pleural effusion for pneumothorax. No acute osseous findings are seen. IMPRESSION: Stable chest.  No active cardiopulmonary process. Electronically Signed   By: Richardean Sale M.D.   On: 05/03/2016 17:26   I have personally reviewed and evaluated these images and lab results as part of my medical decision-making.   EKG Interpretation   Date/Time:  Thursday May 03 2016 17:05:05 EDT Ventricular Rate:  70 PR Interval:  186 QRS Duration: 90 QT Interval:  384 QTC Calculation: 414 R Axis:   -33 Text Interpretation:  Normal sinus rhythm Left axis deviation Abnormal ECG  No significant change since last tracing Confirmed by [facility]  MD, Amedee Cerrone  [id number]) on 05/03/2016 6:05:18 PM      MDM   Final diagnoses:  Essential hypertension    Pt presenting with c/o flushed feeling and chest discomfort earlier today.  Symptoms began at 11am.  He noted his blood pressure to be elevated.  Currently he has no symptoms and symptoms have not recurred since 11am.  ekg reasssuring,  2 sets of troponins checked as patient does have some risk factors for ACS.  He also notes he had a normal stress test approx 1.5 years ago.  As pain was > 6 hours ago as well, this makes ACS unlikely.  Doubt PE.  No signs of pneumonia, ptx, chf.  Pt is anxious to leave the ED tonight.  Advised to arrange for close followup with PMD.   Discharged with strict return precautions.  Pt agreeable with plan.  6:15 PM pt has  a heart score of 4  Alfonzo Beers, MD 05/03/16 2326

## 2016-05-03 NOTE — ED Notes (Signed)
Dr. Canary Brim advised pt may eat/drink.

## 2016-05-03 NOTE — ED Notes (Signed)
Gave pt [place] sandwich and ice water, per Chrislyn - Therapist, sports.

## 2016-05-03 NOTE — ED Notes (Signed)
Patient verbalized understanding of discharge instructions and denies any further needs or questions at this time. VS stable. Patient ambulatory with steady gait.  

## 2016-05-03 NOTE — Discharge Instructions (Signed)
Return to the ED with any concerns including difficulty breathing, chest pain, fainting, palpitations, weakness of arms or legs, decreased level of alertness/lethargy, or any other alarming symptoms

## 2016-05-03 NOTE — ED Notes (Signed)
Pt c/o mid sternal CP while raking leaves today; pt sts pain resolved at present

## 2016-05-09 ENCOUNTER — Encounter: Payer: Self-pay | Admitting: Physician Assistant

## 2016-05-09 ENCOUNTER — Ambulatory Visit (INDEPENDENT_AMBULATORY_CARE_PROVIDER_SITE_OTHER): Payer: 59 | Admitting: Physician Assistant

## 2016-05-09 VITALS — BP 132/76 | HR 54 | Ht 71.5 in | Wt 285.5 lb

## 2016-05-09 DIAGNOSIS — I10 Essential (primary) hypertension: Secondary | ICD-10-CM | POA: Diagnosis not present

## 2016-05-09 DIAGNOSIS — E7800 Pure hypercholesterolemia, unspecified: Secondary | ICD-10-CM | POA: Diagnosis not present

## 2016-05-09 DIAGNOSIS — R0789 Other chest pain: Secondary | ICD-10-CM

## 2016-05-09 DIAGNOSIS — R011 Cardiac murmur, unspecified: Secondary | ICD-10-CM

## 2016-05-09 DIAGNOSIS — E1149 Type 2 diabetes mellitus with other diabetic neurological complication: Secondary | ICD-10-CM | POA: Diagnosis not present

## 2016-05-09 NOTE — Patient Instructions (Signed)
Your physician recommends that you schedule a follow-up appointment in: AS NEEDED WITH DR. Martinique.

## 2016-05-09 NOTE — Progress Notes (Signed)
Patient ID: Joshua Koch, male   DOB: 12-22-1945, 70 y.o.   MRN: [id number]    Date:  05/09/2016   ID:  Loni, Abdon 08/26/1946, MRN [id number]  PCP:  Haywood Pao, MD  Primary Cardiologist:  Martinique  Chief Complaint  Patient presents with  . Follow-up    bp has been elevated top number high and bottom number normal     History of Present Illness: DEWEL LOTTER is a 70 y.o. obese male history of diabetes mellitus, hypertension, hyperlipidemia, Obstructive sleep apnea-on CPAP nightly. His stress test 11/11/2014 which was normal with normal ejection fraction. He was seen in the emergency room on 05/04/2015 with complaints of flushing sensation and tight feeling in his chest. EKG was normal sinus rhythm with a left axis deviation. This was no change from his prior EKG.  Troponin was negative.  Chest x-ray was negative.  Now here for follow-up.  Uterine reports that he had injured his left shoulder approximate 2 months ago that has been causing some pain across his back and the left side of his chest. Says it's worse when he moves his arm around certain directions. He is supposed to follow with orthopedic next week.  When he had flushing feeling the other days blood pressure was elevated at 175/97.  His blood pressures been well-controlled since it had no other flushing feeling sensations.  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  Denies any chest pressure pain while working.  The patient currently denies nausea, vomiting, fever, shortness of breath beyond baseline, orthopnea, dizziness, PND, cough, congestion, abdominal pain, hematochezia, melena, lower extremity edema, claudication.  Wt Readings from Last 3 Encounters:  05/09/16 285 lb 8 oz (129.502 kg)  11/11/14 280 lb (127.007 kg)  10/04/14 280 lb (127.007 kg)     Past Medical History  Diagnosis Date  . Diabetes mellitus   . Hypertension   . Hyperlipemia   .  Obesity   . Low back pain   . Kidney stone   . Diabetes mellitus type 2 with neurological manifestations ([place]) 10/04/2014    Current Outpatient Prescriptions  Medication Sig Dispense Refill  . aspirin (ECOTRIN LOW STRENGTH) 81 MG EC tablet Take 81 mg by mouth daily. Swallow whole.    . cetirizine (ZYRTEC) 10 MG tablet Take 10 mg by mouth daily.    . furosemide (LASIX) 20 MG tablet Take 20 mg by mouth daily.    Marland Kitchen glimepiride (AMARYL) 4 MG tablet Take 4 mg by mouth 2 (two) times daily.    Marland Kitchen losartan (COZAAR) 100 MG tablet Take 100 mg by mouth daily.    . Misc Natural Products (OSTEO BI-FLEX ADV DOUBLE ST PO) Take 2 tablets by mouth daily.    . Misc Natural Products (OSTEO BI-FLEX ADV JOINT SHIELD PO) Take 2 tablets by mouth daily.    . pioglitazone-metformin (ACTOPLUS MET) 15-850 MG per tablet Take 1 tablet by mouth 2 (two) times daily with a meal.    . pioglitazone-metformin (ACTOPLUS MET) 15-850 MG per tablet Take 1 tablet by mouth daily with breakfast.    . simvastatin (ZOCOR) 20 MG tablet Take 20 mg by mouth daily.     No current facility-administered medications for this visit.    Allergies:   No Known Allergies  Social History:  The patient  reports that he has never smoked. He has never used smokeless tobacco. He reports that he  drinks about 0.6 oz of alcohol per week. He reports that he does not use illicit drugs.   Family history:   Family History  Problem Relation Age of Onset  . Heart attack Father 43    MI    ROS:  Please see the history of present illness.  All other systems reviewed and negative.   PHYSICAL EXAM: VS:  BP 132/76 mmHg  Pulse 54  Ht 5' 11.5" (1.816 m)  Wt 285 lb 8 oz (129.502 kg)  BMI 39.27 kg/m2 obese, well developed, in no acute distress HEENT: Pupils are equal round react to light accommodation extraocular movements are intact.  Neck: no JVDNo cervical lymphadenopathy. Cardiac: Regular rate and rhythm without murmurs rubs or gallops. Lungs:   clear to auscultation bilaterally, no wheezing, rhonchi or rales Abd: soft, nontender, positive bowel sounds all quadrants, no hepatosplenomegaly Ext: no lower extremity edema.  2+ radial and dorsalis pedis pulses. Skin: warm and dry Neuro:  Grossly normal     ASSESSMENT AND PLAN:  Problem List Items Addressed This Visit    Hypercholesterolemia   Heart murmur   Diabetes mellitus type 2 with neurological manifestations (HCC)   Benign essential HTN   Atypical chest pain - Primary     Mr. Norville is a 70 year old obese male with a history of hypertension, diabetes, hyperlipidemia, sleep apnea. He was seen in the emergency room for flushing sensation and elevated blood pressure on May 25. He had a normal stress test December 2015. His symptoms did not sound like angina in the chest tightness and back pain sounds like it's related to his left shoulder injury. His blood pressure is controlled today. We did talk extensively about diet and exercise. There is no acute EKG changes does have a left axis deviation which was on his prior EKG. He can follow-up as needed with Dr. Martinique

## 2017-08-16 ENCOUNTER — Telehealth: Payer: Self-pay | Admitting: Cardiology

## 2017-08-16 ENCOUNTER — Emergency Department (HOSPITAL_COMMUNITY): Payer: 59

## 2017-08-16 ENCOUNTER — Emergency Department (HOSPITAL_COMMUNITY)
Admission: EM | Admit: 2017-08-16 | Discharge: 2017-08-16 | Disposition: A | Discharge status: Home / Self Care | Payer: 59 | Attending: Emergency Medicine | Admitting: Emergency Medicine

## 2017-08-16 ENCOUNTER — Encounter (HOSPITAL_COMMUNITY): Payer: Self-pay | Admitting: *Deleted

## 2017-08-16 DIAGNOSIS — Z7982 Long term (current) use of aspirin: Secondary | ICD-10-CM | POA: Insufficient documentation

## 2017-08-16 DIAGNOSIS — I10 Essential (primary) hypertension: Secondary | ICD-10-CM | POA: Diagnosis not present

## 2017-08-16 DIAGNOSIS — R072 Precordial pain: Secondary | ICD-10-CM | POA: Diagnosis not present

## 2017-08-16 DIAGNOSIS — Z79899 Other long term (current) drug therapy: Secondary | ICD-10-CM | POA: Insufficient documentation

## 2017-08-16 DIAGNOSIS — R9431 Abnormal electrocardiogram [ECG] [EKG]: Secondary | ICD-10-CM | POA: Diagnosis not present

## 2017-08-16 DIAGNOSIS — E119 Type 2 diabetes mellitus without complications: Secondary | ICD-10-CM | POA: Insufficient documentation

## 2017-08-16 DIAGNOSIS — Z7984 Long term (current) use of oral hypoglycemic drugs: Secondary | ICD-10-CM | POA: Insufficient documentation

## 2017-08-16 DIAGNOSIS — R55 Syncope and collapse: Secondary | ICD-10-CM | POA: Diagnosis present

## 2017-08-16 HISTORY — DX: Pure hypercholesterolemia, unspecified: E78.00

## 2017-08-16 HISTORY — DX: Sleep apnea, unspecified: G47.30

## 2017-08-16 HISTORY — DX: Male erectile dysfunction, unspecified: N52.9

## 2017-08-16 HISTORY — DX: Type 2 diabetes mellitus without complications: E11.9

## 2017-08-16 LAB — COMPREHENSIVE METABOLIC PANEL
ALBUMIN: 3.5 g/dL (ref 3.5–5.0)
ALT: 20 U/L (ref 17–63)
ANION GAP: 7 (ref 5–15)
AST: 22 U/L (ref 15–41)
Alkaline Phosphatase: 32 U/L — ABNORMAL LOW (ref 38–126)
BILIRUBIN TOTAL: 0.7 mg/dL (ref 0.3–1.2)
BUN: 19 mg/dL (ref 6–20)
CO2: 24 mmol/L (ref 22–32)
Calcium: 9.2 mg/dL (ref 8.9–10.3)
Chloride: 106 mmol/L (ref 101–111)
Creatinine, Ser: 1.1 mg/dL (ref 0.61–1.24)
GFR calc non Af Amer: >60 mL/min (ref >60)
GLUCOSE: 101 mg/dL — AB (ref 65–99)
POTASSIUM: 4.4 mmol/L (ref 3.5–5.1)
SODIUM: 137 mmol/L (ref 135–145)
TOTAL PROTEIN: 6.7 g/dL (ref 6.5–8.1)

## 2017-08-16 LAB — CBC WITH DIFFERENTIAL/PLATELET
BASOS ABS: 0 10*3/uL (ref 0.0–0.1)
BASOS PCT: 0 %
Eosinophils Absolute: 0 10*3/uL (ref 0.0–0.7)
Eosinophils Relative: 0 %
HEMATOCRIT: 38.2 % — AB (ref 39.0–52.0)
HEMOGLOBIN: 12.8 g/dL — AB (ref 13.0–17.0)
LYMPHS PCT: 20 %
Lymphs Abs: 2.2 10*3/uL (ref 0.7–4.0)
MCH: 30.2 pg (ref 26.0–34.0)
MCHC: 33.5 g/dL (ref 30.0–36.0)
MCV: 90.1 fL (ref 78.0–100.0)
MONO ABS: 1.4 10*3/uL — AB (ref 0.1–1.0)
Monocytes Relative: 13 %
NEUTROS ABS: 7.5 10*3/uL (ref 1.7–7.7)
NEUTROS PCT: 67 %
Platelets: 265 10*3/uL (ref 150–400)
RBC: 4.24 MIL/uL (ref 4.22–5.81)
RDW: 13.5 % (ref 11.5–15.5)
WBC: 11.2 10*3/uL — AB (ref 4.0–10.5)

## 2017-08-16 LAB — PROTIME-INR
INR: 1.04
Prothrombin Time: 13.5 seconds (ref 11.4–15.2)

## 2017-08-16 LAB — I-STAT TROPONIN, ED
TROPONIN I, POC: 0 ng/mL (ref 0.00–0.08)
TROPONIN I, POC: 0 ng/mL (ref 0.00–0.08)

## 2017-08-16 LAB — LIPASE, BLOOD: LIPASE: 30 U/L (ref 11–51)

## 2017-08-16 MED ORDER — PANTOPRAZOLE SODIUM 40 MG IV SOLR
40.0000 mg | Freq: Once | INTRAVENOUS | Status: AC
  Administered 2017-08-16: 40 mg via INTRAVENOUS
  Filled 2017-08-16: qty 40

## 2017-08-16 MED ORDER — NITROGLYCERIN 0.4 MG SL SUBL
0.4000 mg | SUBLINGUAL_TABLET | SUBLINGUAL | Status: DC | PRN
  Administered 2017-08-16 (×2): 0.4 mg via SUBLINGUAL
  Filled 2017-08-16: qty 1

## 2017-08-16 MED ORDER — ASPIRIN 81 MG PO CHEW
162.0000 mg | CHEWABLE_TABLET | Freq: Once | ORAL | Status: AC
  Administered 2017-08-16: 162 mg via ORAL
  Filled 2017-08-16: qty 2

## 2017-08-16 MED ORDER — MORPHINE SULFATE (PF) 4 MG/ML IV SOLN
4.0000 mg | Freq: Once | INTRAVENOUS | Status: AC
  Administered 2017-08-16: 4 mg via INTRAVENOUS
  Filled 2017-08-16: qty 1

## 2017-08-16 MED ORDER — PANTOPRAZOLE SODIUM 20 MG PO TBEC: 20.0000 mg | DELAYED_RELEASE_TABLET | Freq: Every day | ORAL | 0 refills | Status: DC

## 2017-08-16 MED ORDER — IOPAMIDOL (ISOVUE-370) INJECTION 76%
INTRAVENOUS | Status: AC
  Administered 2017-08-16: 100 mL
  Filled 2017-08-16: qty 100

## 2017-08-16 NOTE — ED Provider Notes (Signed)
[facility] DEPT Provider Note   CSN: [id number] Arrival date & time: 08/16/17  1523     History   Chief Complaint Chief Complaint  Patient presents with  . Chest Pain    HPI Joshua Koch is a 71 y.o. male.  HPI Patient reports at bedtime yesterday evening he felt like his CPAP was not working correctly and he was having to struggle to breathe against it. He reports a sedative taking it off he continue to wear it and fell asleep. He reports that he awakened about 3 AM and had pain all across his upper chest and back. He reports he got up and tried to move around. He felt kind of weak and so he wanted to go to the kitchen to get some juice. He reports that he got clammy and weak and then is up passing out. He awakened and was able to get back up to his knees and call for his wife. He then got his juice and felt better. (He had an episode in the past that he assumed was hypoglycemia that felt very similar to this. He however did not have a home CBG monitor) he reports he however continued to have feeling of pain across the top of his shoulders and chest particularly if he took a deep breath then it felt that it was coming up the center of his throat. He proceeded to see his family doctor and had an EKG done. There was concern that the EKG was abnormal and he was referred to the emergency department. Patient reports that the pain has been gradually improving since its onset. He still has some discomfort which again he qualifies as being in the center upper chest and shoulders. He reports it is worse if he takes a deep breath. No recent fever, cough, no lower extremity swelling or calf pain. Patient reports he did have an evaluation for chest pain about 2 years ago that was negative. He reports he had a stress test about 2015 which did not show any acute abnormalities. Past Medical History:  Diagnosis Date  . Diabetes mellitus without complication ([place])   . Erectile dysfunction   .  Hypercholesteremia   . Hypertension   . Obesity   . Sleep apnea     There are no active problems to display for this patient.   History reviewed. No pertinent surgical history.     Home Medications    Prior to Admission medications   Medication Sig Start Date End Date Taking? Authorizing Provider  aspirin EC 81 MG tablet Take 81 mg by mouth at bedtime.   Yes [provider]  cetirizine (ZYRTEC) 10 MG tablet Take 10 mg by mouth daily.   Yes [provider]  furosemide (LASIX) 20 MG tablet Take 20 mg by mouth daily. 06/06/17  Yes [provider]  glimepiride (AMARYL) 4 MG tablet Take 4 mg by mouth 2 (two) times daily. 06/06/17  Yes [provider]  ibuprofen (ADVIL,MOTRIN) 200 MG tablet Take 200 mg by mouth every 6 (six) hours as needed (for pain).   Yes [provider]  losartan (COZAAR) 100 MG tablet Take 100 mg by mouth every evening. 06/21/17  Yes [provider]  Misc Natural Products (OSTEO BI-FLEX JOINT SHIELD) TABS Take 1 tablet by mouth 2 (two) times daily.   Yes [provider]  pioglitazone-metformin (ACTOPLUS MET) 15-850 MG tablet Take 1 tablet by mouth 2 (two) times daily. 06/21/17  Yes [provider]  simvastatin (ZOCOR) 20 MG tablet Take 20 mg by mouth at bedtime.  06/21/17  Yes [provider]  pantoprazole (PROTONIX) 20 MG tablet Take 1 tablet (20 mg total) by mouth daily. 08/16/17   Charlesetta Shanks, MD    Family History History reviewed. No pertinent family history.  Social History Social History  Substance Use Topics  . Smoking status: Not on file  . Smokeless tobacco: Not on file  . Alcohol use Not on file     Allergies   Patient has no known allergies.   Review of Systems Review of Systems 10 Systems reviewed and are negative for acute change except as noted in the HPI.   Physical Exam Updated Vital Signs BP (!) 113/50   Pulse 67   Temp 99 F (37.2 C) (Oral)   Resp 16    SpO2 99%   Physical Exam  Constitutional: He is oriented to person, place, and time. He appears well-developed and well-nourished. No distress.  HENT:  Head: Normocephalic and atraumatic.  Nose: Nose normal.  Mouth/Throat: Oropharynx is clear and moist.  Eyes: Conjunctivae and EOM are normal.  Neck: Neck supple. No thyromegaly present.  Cardiovascular: Normal rate, regular rhythm, normal heart sounds and intact distal pulses.   No murmur heard. Pulmonary/Chest: Effort normal and breath sounds normal. No respiratory distress. He exhibits tenderness.  Abdominal: Soft. He exhibits no distension and no mass. There is no tenderness. There is no guarding.  Musculoskeletal: Normal range of motion. He exhibits no edema or tenderness.  Lymphadenopathy:    He has no cervical adenopathy.  Neurological: He is alert and oriented to person, place, and time. No cranial nerve deficit. He exhibits normal muscle tone. Coordination normal.  Skin: Skin is warm and dry.  Psychiatric: He has a normal mood and affect.  Nursing note and vitals reviewed.    ED Treatments / Results  Labs (all labs ordered are listed, but only abnormal results are displayed) Labs Reviewed  COMPREHENSIVE METABOLIC PANEL - Abnormal; Notable for the following:       Result Value   Glucose, Bld 101 (*)    Alkaline Phosphatase 32 (*)    All other components within normal limits  CBC WITH DIFFERENTIAL/PLATELET - Abnormal; Notable for the following:    WBC 11.2 (*)    Hemoglobin 12.8 (*)    HCT 38.2 (*)    Monocytes Absolute 1.4 (*)    All other components within normal limits  PROTIME-INR  LIPASE, BLOOD  I-STAT TROPONIN, ED  I-STAT TROPONIN, ED    EKG  EKG Interpretation  Date/Time:  Friday August 16 2017 15:32:54 EDT Ventricular Rate:  79 PR Interval:  184 QRS Duration: 98 QT Interval:  356 QTC Calculation: 408 R Axis:   -23 Text InterpretationCritical Test Result: STEMI Normal sinus rhythm ST elevation  consider inferior injury or acute infarct ACUTE MI / STEMI  Abnormal ECG borderline inferior elevation. no reciprocal changes. no old comparison. possible ischemia. Confirmed by Charlesetta Shanks [phone]) on 08/16/2017 3:38:36 PM       Radiology Dg Chest 2 View  Result Date: 08/16/2017 CLINICAL DATA:  Chest pain since this morning across top of chest and into back, history hypertension, diabetes mellitus EXAM: CHEST  2 VIEW COMPARISON:  None FINDINGS: Upper normal heart size. Mediastinal contours and pulmonary vascularity normal. Minimal LEFT basilar atelectasis in lingula. Lungs otherwise clear. No pleural effusion or pneumothorax. Bones unremarkable. IMPRESSION: Minimal subsegmental atelectasis at base of lingula. Electronically Signed   By:  Lavonia Dana M.D.   On: 08/16/2017 16:52   Ct Angio Chest Pe W/cm &/or Wo Cm  Result Date: 08/16/2017 CLINICAL DATA:  Shortness of breath. Chest and neck heaviness today. EXAM: CT ANGIOGRAPHY CHEST WITH CONTRAST TECHNIQUE: Multidetector CT imaging of the chest was performed using the standard protocol during bolus administration of intravenous contrast. Multiplanar CT image reconstructions and MIPs were obtained to evaluate the vascular anatomy. CONTRAST:  59 mL Isovue 370 COMPARISON:  Chest radiographs earlier today FINDINGS: Cardiovascular: Pulmonary arterial opacification is adequate without evidence of emboli. There is mild thoracic aortic atherosclerosis without aneurysm. Minimal coronary artery atherosclerosis is noted. Heart size is within normal limits. No pericardial effusion. Mediastinum/Nodes: No enlarged axillary or hilar lymph nodes. Mildly prominent sub-carinal and paraesophageal lymph nodes measure up to 1.2 cm in short axis. The esophagus is completely decompressed, limiting evaluation for wall thickening. The thyroid is grossly unremarkable. Lungs/Pleura: No pleural effusion or pneumothorax. Mild atelectasis in the lingula. Slight mosaic attenuation in the  lower lobes which may reflect mild air trapping given mild bronchial wall thickening. Upper Abdomen: Unremarkable. Musculoskeletal: No acute osseous abnormality or suspicious osseous lesion. Review of the MIP images confirms the above findings. IMPRESSION: 1. No evidence of pulmonary emboli. 2. Mildly enlarged subcarinal and paraesophageal lymph nodes, indeterminate. 3. Mild bronchial wall thickening.  Mild lingular atelectasis. 4. Aortic Atherosclerosis (ICD10-I70.0). Electronically Signed   By: Logan Bores M.D.   On: 08/16/2017 19:03    Procedures Procedures (including critical care time) CRITICAL CARE Performed by: Charlesetta Shanks   Total critical care time: 45 minutes  Critical care time was exclusive of separately billable procedures and treating other patients.  Critical care was necessary to treat or prevent imminent or life-threatening deterioration.  Critical care was time spent personally by me on the following activities: development of treatment plan with patient and/or surrogate as well as nursing, discussions with consultants, evaluation of patient's response to treatment, examination of patient, obtaining history from patient or surrogate, ordering and performing treatments and interventions, ordering and review of laboratory studies, ordering and review of radiographic studies, pulse oximetry and re-evaluation of patient's condition. Medications Ordered in ED Medications  nitroGLYCERIN (NITROSTAT) SL tablet 0.4 mg (0.4 mg Sublingual Given 08/16/17 1637)  aspirin chewable tablet 162 mg (162 mg Oral Given 08/16/17 1631)  morphine 4 MG/ML injection 4 mg (4 mg Intravenous Given 08/16/17 1904)  pantoprazole (PROTONIX) injection 40 mg (40 mg Intravenous Given 08/16/17 1901)  iopamidol (ISOVUE-370) 76 % injection (100 mLs  Contrast Given 08/16/17 1836)     Initial Impression / Assessment and Plan / ED Course  I have reviewed the triage vital signs and the nursing notes.  Pertinent labs &  imaging results that were available during my care of the patient were reviewed by me and considered in my medical decision making (see chart for details).     Consult: Reviewed Dr. Luther Parody cardiology will proceed with PE study and plan for hospitalist admit for continuing rule out MI and chest pain. He advises for hospitalist to consult cardiology if there is a change in troponin or other cardiac ischemic concerns as pain is atypical with his continuous nature and negative cardiac enzymes.  Final Clinical Impressions(s) / ED Diagnoses   Final diagnoses:  Precordial pain  EKG abnormalities   Patient developed chest pain as outlined above. He had been constant since its onset at about 3 AM. It has been waning and was administration of morphine and Protonix much improved.  He denies he got any improvement with nitroglycerin. After return of CT pulmonary embolus which is negative for PE, patient advised that he is feeling continuously better and does not wish to be admitted to the hospital. At this time, I have proceeded with a second troponin which is as well negative. Pain does sound atypical with his continuous nature worse at onset and gradually abating over the course of greater than 12 hours. Patient is counseled on signs and symptoms first return. He is counseled on need for expeditious follow-up with cardiology. New Prescriptions New Prescriptions   PANTOPRAZOLE (PROTONIX) 20 MG TABLET    Take 1 tablet (20 mg total) by mouth daily.     Charlesetta Shanks, MD 08/16/17 2109

## 2017-08-16 NOTE — ED Notes (Signed)
Pt to CT

## 2017-08-16 NOTE — ED Triage Notes (Signed)
Pt reports having mid chest and neck heaviness today. Has cardiac hx. ekg done and airway intact.

## 2017-08-16 NOTE — Discharge Instructions (Signed)
Continue daily aspirin. Start taking Protonix daily. Return to the emergency department immediately if you have recurrence of pain or other concerning symptoms. Make an appointment to see her cardiologist for recheck next week.

## 2017-08-16 NOTE — Telephone Encounter (Signed)
New message     Doc day call - Syncope issue

## 2017-08-21 ENCOUNTER — Encounter: Payer: Self-pay | Admitting: Physician Assistant

## 2017-09-06 ENCOUNTER — Telehealth: Payer: Self-pay

## 2017-09-06 NOTE — Telephone Encounter (Signed)
Received clearance from [facility].Dr.Jordan cleared patient for upcoming surgery.Clearance faxed back to fax # [phone].

## 2017-10-01 ENCOUNTER — Ambulatory Visit: Payer: Self-pay | Admitting: Orthopedic Surgery

## 2017-10-14 NOTE — Pre-Procedure Instructions (Signed)
The following are in epic:   EKG 08/17/17 CT chest and CXR 08/16/17  Last office note Blanch Media, NP on chart  [facility] [place] to fax surgical clearance to [facility] presurgical testing 10/14/17

## 2017-10-14 NOTE — Patient Instructions (Addendum)
TAL KEMPKER  10/14/2017   Your procedure is scheduled on: Monday, Nov. 12, 2018   Surgery time: 9:25 AM-10:15 AM   Report to [facility] Main  Entrance    Report to admitting at 6:45 AM   Call this number if you have problems the morning of surgery [phone]   Remember: ONLY 1 PERSON MAY GO WITH YOU TO SHORT STAY TO GET  READY MORNING OF [place].   Do not eat food or drink liquids :After Midnight.    Take these medicines the morning of surgery with A SIP OF WATER: Cetirizine (Zyrtec)   DO NOT TAKE ANY DIABETIC MEDICATIONS DAY OF YOUR SURGERY   Do NOT take Glimepiride (Amaryl) and Pioglitazone-Metformin the evening before surgery   Bring CPAP mask and tubing only day of surgery                               You may not have any metal on your body including jewelry and body piercings             Do not wear lotions, powders or perfumes, deodorant                           Men may shave face and neck.   Do not bring valuables to the hospital. [place].   Contacts, dentures or bridgework may not be worn into surgery.   Leave suitcase in the car. After surgery it may be brought to your room.              Please read over the following fact sheets you were given: _____________________________________________________________________             [facility] - Preparing for Surgery Before surgery, you can play an important role.  Because skin is not sterile, your skin needs to be as free of germs as possible.  You can reduce the number of germs on your skin by washing with CHG (chlorahexidine gluconate) soap before surgery.  CHG is an antiseptic cleaner which kills germs and bonds with the skin to continue killing germs even after washing. Please DO NOT use if you have an allergy to CHG or antibacterial soaps.  If your skin becomes reddened/irritated stop using the CHG and inform your nurse when  you arrive at Short Stay. Do not shave (including legs and underarms) for at least 48 hours prior to the first CHG shower.  You may shave your face/neck.  Please follow these instructions carefully:  1.  Shower with CHG Soap the night before surgery and the  morning of Surgery.  2.  If you choose to wash your hair, wash your hair first as usual with your  normal  shampoo.  3.  After you shampoo, rinse your hair and body thoroughly to remove the  shampoo.                             4.  Use CHG as you would any other liquid soap.  You can apply chg directly  to the skin and wash  Gently with a scrungie or clean washcloth.  5.  Apply the CHG Soap to your body ONLY FROM THE NECK DOWN.   Do not use on face/ open                           Wound or open sores. Avoid contact with eyes, ears mouth and genitals (private parts).                       Wash face,  Genitals (private parts) with your normal soap.             6.  Wash thoroughly, paying special attention to the area where your surgery  will be performed.  7.  Thoroughly rinse your body with warm water from the neck down.  8.  DO NOT shower/wash with your normal soap after using and rinsing off  the CHG Soap.                9.  Pat yourself dry with a clean towel.            10.  Wear clean pajamas.            11.  Place clean sheets on your bed the night of your first shower and do not  sleep with pets. Day of Surgery : Do not apply any lotions/deodorants the morning of surgery.  Please wear clean clothes to the hospital/surgery center.  FAILURE TO FOLLOW THESE INSTRUCTIONS MAY RESULT IN THE CANCELLATION OF YOUR SURGERY  PATIENT SIGNATURE_________________________________  NURSE SIGNATURE__________________________________  ________________________________________________________________________   Adam Phenix  An incentive spirometer is a tool that can help keep your lungs clear and active. This tool  measures how well you are filling your lungs with each breath. Taking long deep breaths may help reverse or decrease the chance of developing breathing (pulmonary) problems (especially infection) following:  A long period of time when you are unable to move or be active. BEFORE THE PROCEDURE   If the spirometer includes an indicator to show your best effort, your nurse or respiratory therapist will set it to a desired goal.  If possible, sit up straight or lean slightly forward. Try not to slouch.  Hold the incentive spirometer in an upright position. INSTRUCTIONS FOR USE  1. Sit on the edge of your bed if possible, or sit up as far as you can in bed or on a chair. 2. Hold the incentive spirometer in an upright position. 3. Breathe out normally. 4. Place the mouthpiece in your mouth and seal your lips tightly around it. 5. Breathe in slowly and as deeply as possible, raising the piston or the ball toward the top of the column. 6. Hold your breath for 3-5 seconds or for as long as possible. Allow the piston or ball to fall to the bottom of the column. 7. Remove the mouthpiece from your mouth and breathe out normally. 8. Rest for a few seconds and repeat Steps 1 through 7 at least 10 times every 1-2 hours when you are awake. Take your time and take a few normal breaths between deep breaths. 9. The spirometer may include an indicator to show your best effort. Use the indicator as a goal to work toward during each repetition. 10. After each set of 10 deep breaths, practice coughing to be sure your lungs are clear. If you have an incision (the cut made at the time of  surgery), support your incision when coughing by placing a pillow or rolled up towels firmly against it. Once you are able to get out of bed, walk around indoors and cough well. You may stop using the incentive spirometer when instructed by your caregiver.  RISKS AND COMPLICATIONS  Take your time so you do not get dizzy or  light-headed.  If you are in pain, you may need to take or ask for pain medication before doing incentive spirometry. It is harder to take a deep breath if you are having pain. AFTER USE  Rest and breathe slowly and easily.  It can be helpful to keep track of a log of your progress. Your caregiver can provide you with a simple table to help with this. If you are using the spirometer at home, follow these instructions: [place] IF:   You are having difficultly using the spirometer.  You have trouble using the spirometer as often as instructed.  Your pain medication is not giving enough relief while using the spirometer.  You develop fever of 100.5 F (38.1 C) or higher. SEEK IMMEDIATE MEDICAL CARE IF:   You cough up bloody sputum that had not been present before.  You develop fever of 102 F (38.9 C) or greater.  You develop worsening pain at or near the incision site. MAKE SURE YOU:   Understand these instructions.  Will watch your condition.  Will get help right away if you are not doing well or get worse. Document Released: 04/08/2007 Document Revised: 02/18/2012 Document Reviewed: 06/09/2007 ExitCare Patient Information 2014 ExitCare, [place].   ________________________________________________________________________  WHAT IS A BLOOD TRANSFUSION? Blood Transfusion Information  A transfusion is the replacement of blood or some of its parts. Blood is made up of multiple cells which provide different functions.  Red blood cells carry oxygen and are used for blood loss replacement.  White blood cells fight against infection.  Platelets control bleeding.  Plasma helps clot blood.  Other blood products are available for specialized needs, such as hemophilia or other clotting disorders. BEFORE THE TRANSFUSION  Who gives blood for transfusions?   Healthy volunteers who are fully evaluated to make sure their blood is safe. This is blood bank  blood. Transfusion therapy is the safest it has ever been in the practice of medicine. Before blood is taken from a donor, a complete history is taken to make sure that person has no history of diseases nor engages in risky social behavior (examples are intravenous drug use or sexual activity with multiple partners). The donor's travel history is screened to minimize risk of transmitting infections, such as malaria. The donated blood is tested for signs of infectious diseases, such as HIV and hepatitis. The blood is then tested to be sure it is compatible with you in order to minimize the chance of a transfusion reaction. If you or a relative donates blood, this is often done in anticipation of surgery and is not appropriate for emergency situations. It takes many days to process the donated blood. RISKS AND COMPLICATIONS Although transfusion therapy is very safe and saves many lives, the main dangers of transfusion include:   Getting an infectious disease.  Developing a transfusion reaction. This is an allergic reaction to something in the blood you were given. Every precaution is taken to prevent this. The decision to have a blood transfusion has been considered carefully by your caregiver before blood is given. Blood is not given unless the benefits outweigh the risks. AFTER THE  TRANSFUSION  Right after receiving a blood transfusion, you will usually feel much better and more energetic. This is especially true if your red blood cells have gotten low (anemic). The transfusion raises the level of the red blood cells which carry oxygen, and this usually causes an energy increase.  The nurse administering the transfusion will monitor you carefully for complications. HOME CARE INSTRUCTIONS  No special instructions are needed after a transfusion. You may find your energy is better. Speak with your caregiver about any limitations on activity for underlying diseases you may have. SEEK MEDICAL CARE IF:    Your condition is not improving after your transfusion.  You develop redness or irritation at the intravenous (IV) site. SEEK IMMEDIATE MEDICAL CARE IF:  Any of the following symptoms occur over the next 12 hours:  Shaking chills.  You have a temperature by mouth above 102 F (38.9 C), not controlled by medicine.  Chest, back, or muscle pain.  People around you feel you are not acting correctly or are confused.  Shortness of breath or difficulty breathing.  Dizziness and fainting.  You get a rash or develop hives.  You have a decrease in urine output.  Your urine turns a dark color or changes to pink, red, or brown. Any of the following symptoms occur over the next 10 days:  You have a temperature by mouth above 102 F (38.9 C), not controlled by medicine.  Shortness of breath.  Weakness after normal activity.  The white part of the eye turns yellow (jaundice).  You have a decrease in the amount of urine or are urinating less often.  Your urine turns a dark color or changes to pink, red, or brown. Document Released: 11/23/2000 Document Revised: 02/18/2012 Document Reviewed: 07/12/2008 ExitCare Patient Information 2014 ExitCare, [place].  _______________________________________________________________________ How to Manage Your Diabetes Before and After Surgery  Why is it important to control my blood sugar before and after surgery? . Improving blood sugar levels before and after surgery helps healing and can limit problems. . A way of improving blood sugar control is eating a healthy diet by: o  Eating less sugar and carbohydrates o  Increasing activity/exercise o  Talking with your doctor about reaching your blood sugar goals . High blood sugars (greater than 180 mg/dL) can raise your risk of infections and slow your recovery, so you will need to focus on controlling your diabetes during the weeks before surgery. . Make sure that the doctor who takes care of  your diabetes knows about your planned surgery including the date and location.  How do I manage my blood sugar before surgery? . Check your blood sugar at least 4 times a day, starting 2 days before surgery, to make sure that the level is not too high or low. o Check your blood sugar the morning of your surgery when you wake up and every 2 hours until you get to the Short Stay unit. . If your blood sugar is less than 70 mg/dL, you will need to treat for low blood sugar: o Do not take insulin. o Treat a low blood sugar (less than 70 mg/dL) with  cup of clear juice (cranberry or apple), 4 glucose tablets, OR glucose gel. o Recheck blood sugar in 15 minutes after treatment (to make sure it is greater than 70 mg/dL). If your blood sugar is not greater than 70 mg/dL on recheck, call [phone] for further instructions. . Report your blood sugar to the short stay nurse when  you get to Short Stay.  . If you are admitted to the hospital after surgery: o Your blood sugar will be checked by the staff and you will probably be given insulin after surgery (instead of oral diabetes medicines) to make sure you have good blood sugar levels. o The goal for blood sugar control after surgery is 80-180 mg/dL.   WHAT DO I DO ABOUT MY DIABETES MEDICATION?  [place] Kitchen Do not take oral diabetes medicines (pills) the morning of surgery.  . The day of surgery, do not take other diabetes injectables, including Byetta (exenatide), Bydureon (exenatide ER), Victoza (liraglutide), or Trulicity (dulaglutide).  . If your CBG is greater than 220 mg/dL, you may take  of your sliding scale  . (correction) dose of insulin.  Patient Signature:  Date:   Nurse Signature:  Date:   Reviewed and Endorsed by [facility] Patient Education Committee, August 2015

## 2017-10-15 ENCOUNTER — Encounter (HOSPITAL_COMMUNITY)
Admission: RE | Admit: 2017-10-15 | Discharge: 2017-10-15 | Disposition: A | Discharge status: Home / Self Care | Payer: 59 | Source: Ambulatory Visit | Attending: Orthopedic Surgery | Admitting: Orthopedic Surgery

## 2017-10-15 ENCOUNTER — Encounter (HOSPITAL_COMMUNITY): Payer: Self-pay

## 2017-10-15 ENCOUNTER — Other Ambulatory Visit: Payer: Self-pay

## 2017-10-15 DIAGNOSIS — I10 Essential (primary) hypertension: Secondary | ICD-10-CM | POA: Diagnosis not present

## 2017-10-15 DIAGNOSIS — K219 Gastro-esophageal reflux disease without esophagitis: Secondary | ICD-10-CM | POA: Insufficient documentation

## 2017-10-15 DIAGNOSIS — G4730 Sleep apnea, unspecified: Secondary | ICD-10-CM | POA: Diagnosis not present

## 2017-10-15 DIAGNOSIS — J45909 Unspecified asthma, uncomplicated: Secondary | ICD-10-CM | POA: Insufficient documentation

## 2017-10-15 DIAGNOSIS — Z01812 Encounter for preprocedural laboratory examination: Secondary | ICD-10-CM | POA: Insufficient documentation

## 2017-10-15 DIAGNOSIS — Z7984 Long term (current) use of oral hypoglycemic drugs: Secondary | ICD-10-CM | POA: Diagnosis not present

## 2017-10-15 DIAGNOSIS — Z01810 Encounter for preprocedural cardiovascular examination: Secondary | ICD-10-CM | POA: Diagnosis not present

## 2017-10-15 DIAGNOSIS — E119 Type 2 diabetes mellitus without complications: Secondary | ICD-10-CM | POA: Diagnosis not present

## 2017-10-15 DIAGNOSIS — M1711 Unilateral primary osteoarthritis, right knee: Secondary | ICD-10-CM | POA: Insufficient documentation

## 2017-10-15 HISTORY — DX: Personal history of urinary calculi: Z87.442

## 2017-10-15 HISTORY — DX: Dyspnea, unspecified: R06.00

## 2017-10-15 HISTORY — DX: Unspecified asthma, uncomplicated: J45.909

## 2017-10-15 HISTORY — DX: Cardiac murmur, unspecified: R01.1

## 2017-10-15 HISTORY — DX: Gastro-esophageal reflux disease without esophagitis: K21.9

## 2017-10-15 HISTORY — DX: Unspecified disorder of synovium and tendon, unspecified shoulder: M67.919

## 2017-10-15 HISTORY — DX: Headache, unspecified: R51.9

## 2017-10-15 HISTORY — DX: Unspecified osteoarthritis, unspecified site: M19.90

## 2017-10-15 HISTORY — DX: Headache: R51

## 2017-10-15 HISTORY — DX: Malignant (primary) neoplasm, unspecified: C80.1

## 2017-10-15 LAB — CBC
HEMATOCRIT: 41.3 % (ref 39.0–52.0)
HEMOGLOBIN: 13.8 g/dL (ref 13.0–17.0)
MCH: 29.9 pg (ref 26.0–34.0)
MCHC: 33.4 g/dL (ref 30.0–36.0)
MCV: 89.6 fL (ref 78.0–100.0)
Platelets: 301 10*3/uL (ref 150–400)
RBC: 4.61 MIL/uL (ref 4.22–5.81)
RDW: 13.3 % (ref 11.5–15.5)
WBC: 8.1 10*3/uL (ref 4.0–10.5)

## 2017-10-15 LAB — COMPREHENSIVE METABOLIC PANEL
ALBUMIN: 3.9 g/dL (ref 3.5–5.0)
ALK PHOS: 44 U/L (ref 38–126)
ALT: 26 U/L (ref 17–63)
AST: 23 U/L (ref 15–41)
Anion gap: 8 (ref 5–15)
BILIRUBIN TOTAL: 0.5 mg/dL (ref 0.3–1.2)
BUN: 15 mg/dL (ref 6–20)
CALCIUM: 9.2 mg/dL (ref 8.9–10.3)
CO2: 26 mmol/L (ref 22–32)
CREATININE: 0.94 mg/dL (ref 0.61–1.24)
Chloride: 106 mmol/L (ref 101–111)
GFR calc non Af Amer: >60 mL/min (ref >60)
GLUCOSE: 138 mg/dL — AB (ref 65–99)
Potassium: 4.8 mmol/L (ref 3.5–5.1)
SODIUM: 140 mmol/L (ref 135–145)
TOTAL PROTEIN: 7.1 g/dL (ref 6.5–8.1)

## 2017-10-15 LAB — PROTIME-INR
INR: 0.98
Prothrombin Time: 12.9 seconds (ref 11.4–15.2)

## 2017-10-15 LAB — APTT: aPTT: 27 seconds (ref 24–36)

## 2017-10-15 LAB — ABO/RH: ABO/RH(D): A POS

## 2017-10-15 LAB — HEMOGLOBIN A1C
HEMOGLOBIN A1C: 6.7 % — AB (ref 4.8–5.6)
MEAN PLASMA GLUCOSE: 145.59 mg/dL

## 2017-10-15 LAB — SURGICAL PCR SCREEN
MRSA, PCR: NEGATIVE
Staphylococcus aureus: NEGATIVE

## 2017-10-15 LAB — GLUCOSE, CAPILLARY: GLUCOSE-CAPILLARY: 154 mg/dL — AB (ref 65–99)

## 2017-10-15 NOTE — Pre-Procedure Instructions (Signed)
HgbA1C and CMP faxed to Dr. Wynelle Link via epic.

## 2017-10-15 NOTE — Pre-Procedure Instructions (Signed)
Telephone note clearance Dr. Martinique 09/06/17 in epic

## 2017-10-20 ENCOUNTER — Ambulatory Visit: Payer: Self-pay | Admitting: Orthopedic Surgery

## 2017-10-20 MED ORDER — DEXTROSE 5 % IV SOLN
3.0000 g | INTRAVENOUS | Status: AC
  Administered 2017-10-21: 3 g via INTRAVENOUS
  Filled 2017-10-20: qty 3
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
## 2017-10-21 ENCOUNTER — Inpatient Hospital Stay (HOSPITAL_COMMUNITY): Payer: 59 | Admitting: Certified Registered Nurse Anesthetist

## 2017-10-21 ENCOUNTER — Encounter (HOSPITAL_COMMUNITY)
Admission: RE | Disposition: A | Discharge status: Home Health | Payer: Self-pay | Source: Ambulatory Visit | Attending: Orthopedic Surgery

## 2017-10-21 ENCOUNTER — Encounter (HOSPITAL_COMMUNITY): Payer: Self-pay

## 2017-10-21 ENCOUNTER — Inpatient Hospital Stay (HOSPITAL_COMMUNITY)
Admission: RE | Admit: 2017-10-21 | Discharge: 2017-10-23 | DRG: 470 | Disposition: A | Discharge status: Home Health | Payer: 59 | Source: Ambulatory Visit | Attending: Orthopedic Surgery | Admitting: Orthopedic Surgery

## 2017-10-21 DIAGNOSIS — E11649 Type 2 diabetes mellitus with hypoglycemia without coma: Secondary | ICD-10-CM | POA: Diagnosis present

## 2017-10-21 DIAGNOSIS — Z87891 Personal history of nicotine dependence: Secondary | ICD-10-CM

## 2017-10-21 DIAGNOSIS — E785 Hyperlipidemia, unspecified: Secondary | ICD-10-CM | POA: Diagnosis present

## 2017-10-21 DIAGNOSIS — M25761 Osteophyte, right knee: Secondary | ICD-10-CM | POA: Diagnosis present

## 2017-10-21 DIAGNOSIS — Z6841 Body Mass Index (BMI) 40.0 and over, adult: Secondary | ICD-10-CM

## 2017-10-21 DIAGNOSIS — G4730 Sleep apnea, unspecified: Secondary | ICD-10-CM | POA: Diagnosis present

## 2017-10-21 DIAGNOSIS — H3530 Unspecified macular degeneration: Secondary | ICD-10-CM | POA: Diagnosis present

## 2017-10-21 DIAGNOSIS — Z85828 Personal history of other malignant neoplasm of skin: Secondary | ICD-10-CM | POA: Diagnosis not present

## 2017-10-21 DIAGNOSIS — M1711 Unilateral primary osteoarthritis, right knee: Principal | ICD-10-CM | POA: Diagnosis present

## 2017-10-21 DIAGNOSIS — M25561 Pain in right knee: Secondary | ICD-10-CM | POA: Diagnosis present

## 2017-10-21 DIAGNOSIS — M179 Osteoarthritis of knee, unspecified: Secondary | ICD-10-CM | POA: Diagnosis present

## 2017-10-21 DIAGNOSIS — M1710 Unilateral primary osteoarthritis, unspecified knee: Secondary | ICD-10-CM | POA: Diagnosis present

## 2017-10-21 DIAGNOSIS — M21061 Valgus deformity, not elsewhere classified, right knee: Secondary | ICD-10-CM | POA: Diagnosis present

## 2017-10-21 DIAGNOSIS — I10 Essential (primary) hypertension: Secondary | ICD-10-CM | POA: Diagnosis present

## 2017-10-21 DIAGNOSIS — K219 Gastro-esophageal reflux disease without esophagitis: Secondary | ICD-10-CM | POA: Diagnosis present

## 2017-10-21 HISTORY — PX: TOTAL KNEE ARTHROPLASTY: SHX125

## 2017-10-21 LAB — TYPE AND SCREEN
ABO/RH(D): A POS
Antibody Screen: NEGATIVE

## 2017-10-21 LAB — GLUCOSE, CAPILLARY
GLUCOSE-CAPILLARY: 149 mg/dL — AB (ref 65–99)
GLUCOSE-CAPILLARY: 118 mg/dL — AB (ref 65–99)
Glucose-Capillary: 197 mg/dL — ABNORMAL HIGH (ref 65–99)
Glucose-Capillary: 261 mg/dL — ABNORMAL HIGH (ref 65–99)

## 2017-10-21 SURGERY — ARTHROPLASTY, KNEE, TOTAL
Anesthesia: Spinal | Site: Knee | Laterality: Right

## 2017-10-21 MED ORDER — CEFAZOLIN SODIUM-DEXTROSE 2-4 GM/100ML-% IV SOLN
2.0000 g | Freq: Four times a day (QID) | INTRAVENOUS | Status: AC
  Administered 2017-10-21 (×2): 2 g via INTRAVENOUS
  Filled 2017-10-21 (×2): qty 100

## 2017-10-21 MED ORDER — METOCLOPRAMIDE HCL 5 MG PO TABS: 5.0000 mg | ORAL_TABLET | Freq: Three times a day (TID) | ORAL | Status: DC | PRN

## 2017-10-21 MED ORDER — PROPOFOL 10 MG/ML IV BOLUS
INTRAVENOUS | Status: AC
  Filled 2017-10-21: qty 40

## 2017-10-21 MED ORDER — GABAPENTIN 300 MG PO CAPS
300.0000 mg | ORAL_CAPSULE | Freq: Once | ORAL | Status: AC
  Administered 2017-10-21: 300 mg via ORAL

## 2017-10-21 MED ORDER — PROPOFOL 10 MG/ML IV BOLUS
INTRAVENOUS | Status: AC
  Filled 2017-10-21: qty 20

## 2017-10-21 MED ORDER — ACETAMINOPHEN 500 MG PO TABS
1000.0000 mg | ORAL_TABLET | Freq: Four times a day (QID) | ORAL | Status: AC
  Administered 2017-10-21 – 2017-10-22 (×4): 1000 mg via ORAL
  Filled 2017-10-21 (×4): qty 2

## 2017-10-21 MED ORDER — ACETAMINOPHEN 10 MG/ML IV SOLN
1000.0000 mg | Freq: Once | INTRAVENOUS | Status: AC
  Administered 2017-10-21: 1000 mg via INTRAVENOUS

## 2017-10-21 MED ORDER — TRANEXAMIC ACID 1000 MG/10ML IV SOLN
1000.0000 mg | INTRAVENOUS | Status: AC
  Administered 2017-10-21: 1000 mg via INTRAVENOUS
  Filled 2017-10-21: qty 1100

## 2017-10-21 MED ORDER — INSULIN ASPART 100 UNIT/ML ~~LOC~~ SOLN
0.0000 [IU] | Freq: Three times a day (TID) | SUBCUTANEOUS | Status: DC
  Administered 2017-10-21 – 2017-10-22 (×2): 3 [IU] via SUBCUTANEOUS
  Administered 2017-10-22: 5 [IU] via SUBCUTANEOUS
  Administered 2017-10-22: 3 [IU] via SUBCUTANEOUS
  Administered 2017-10-23: 2 [IU] via SUBCUTANEOUS

## 2017-10-21 MED ORDER — ACETAMINOPHEN 650 MG RE SUPP: 650.0000 mg | RECTAL | Status: DC | PRN

## 2017-10-21 MED ORDER — ROPIVACAINE HCL 5 MG/ML IJ SOLN
INTRAMUSCULAR | Status: DC | PRN
  Administered 2017-10-21: 20 mL via PERINEURAL

## 2017-10-21 MED ORDER — PIOGLITAZONE HCL-METFORMIN HCL 15-850 MG PO TABS: 1.0000 | ORAL_TABLET | Freq: Two times a day (BID) | ORAL | Status: DC

## 2017-10-21 MED ORDER — OXYCODONE HCL 5 MG PO TABS: 5.0000 mg | ORAL_TABLET | Freq: Once | ORAL | Status: DC | PRN

## 2017-10-21 MED ORDER — PROPOFOL 500 MG/50ML IV EMUL
INTRAVENOUS | Status: DC | PRN
  Administered 2017-10-21: 45 ug/kg/min via INTRAVENOUS

## 2017-10-21 MED ORDER — DEXAMETHASONE SODIUM PHOSPHATE 10 MG/ML IJ SOLN
10.0000 mg | Freq: Once | INTRAMUSCULAR | Status: AC
  Administered 2017-10-21: 10 mg via INTRAVENOUS

## 2017-10-21 MED ORDER — ONDANSETRON HCL 4 MG/2ML IJ SOLN
INTRAMUSCULAR | Status: AC
  Filled 2017-10-21: qty 2

## 2017-10-21 MED ORDER — DIPHENHYDRAMINE HCL 12.5 MG/5ML PO ELIX: 12.5000 mg | ORAL_SOLUTION | ORAL | Status: DC | PRN

## 2017-10-21 MED ORDER — OXYCODONE HCL 5 MG/5ML PO SOLN
5.0000 mg | Freq: Once | ORAL | Status: DC | PRN
  Filled 2017-10-21: qty 5

## 2017-10-21 MED ORDER — FENTANYL CITRATE (PF) 100 MCG/2ML IJ SOLN
50.0000 ug | INTRAMUSCULAR | Status: DC | PRN
  Administered 2017-10-21 (×2): 50 ug via INTRAVENOUS

## 2017-10-21 MED ORDER — OXYCODONE HCL 5 MG PO TABS
10.0000 mg | ORAL_TABLET | ORAL | Status: DC | PRN
  Administered 2017-10-21 – 2017-10-23 (×14): 10 mg via ORAL
  Filled 2017-10-21 (×14): qty 2

## 2017-10-21 MED ORDER — EPHEDRINE 5 MG/ML INJ
INTRAVENOUS | Status: AC
  Filled 2017-10-21: qty 10

## 2017-10-21 MED ORDER — ONDANSETRON HCL 4 MG PO TABS: 4.0000 mg | ORAL_TABLET | Freq: Four times a day (QID) | ORAL | Status: DC | PRN

## 2017-10-21 MED ORDER — SIMVASTATIN 20 MG PO TABS
20.0000 mg | ORAL_TABLET | Freq: Every day | ORAL | Status: DC
  Administered 2017-10-21 – 2017-10-22 (×2): 20 mg via ORAL
  Filled 2017-10-21 (×2): qty 1

## 2017-10-21 MED ORDER — PROMETHAZINE HCL 25 MG/ML IJ SOLN: 6.2500 mg | INTRAMUSCULAR | Status: DC | PRN

## 2017-10-21 MED ORDER — TRANEXAMIC ACID 1000 MG/10ML IV SOLN
1000.0000 mg | Freq: Once | INTRAVENOUS | Status: AC
  Administered 2017-10-21: 1000 mg via INTRAVENOUS
  Filled 2017-10-21: qty 1100

## 2017-10-21 MED ORDER — DEXAMETHASONE SODIUM PHOSPHATE 10 MG/ML IJ SOLN
INTRAMUSCULAR | Status: AC
  Filled 2017-10-21: qty 1

## 2017-10-21 MED ORDER — HYDROMORPHONE HCL 1 MG/ML IJ SOLN: 0.2500 mg | INTRAMUSCULAR | Status: DC | PRN

## 2017-10-21 MED ORDER — SODIUM CHLORIDE 0.9 % IJ SOLN
INTRAMUSCULAR | Status: DC | PRN
  Administered 2017-10-21: 60 mL

## 2017-10-21 MED ORDER — LORATADINE 10 MG PO TABS
10.0000 mg | ORAL_TABLET | Freq: Every day | ORAL | Status: DC
  Administered 2017-10-22 – 2017-10-23 (×2): 10 mg via ORAL
  Filled 2017-10-21 (×2): qty 1

## 2017-10-21 MED ORDER — PHENYLEPHRINE 40 MCG/ML (10ML) SYRINGE FOR IV PUSH (FOR BLOOD PRESSURE SUPPORT)
PREFILLED_SYRINGE | INTRAVENOUS | Status: AC
  Filled 2017-10-21: qty 20

## 2017-10-21 MED ORDER — METFORMIN HCL 850 MG PO TABS
850.0000 mg | ORAL_TABLET | Freq: Two times a day (BID) | ORAL | Status: DC
  Administered 2017-10-22 – 2017-10-23 (×2): 850 mg via ORAL
  Filled 2017-10-21 (×4): qty 1

## 2017-10-21 MED ORDER — OXYCODONE HCL 5 MG PO TABS: 5.0000 mg | ORAL_TABLET | ORAL | Status: DC | PRN

## 2017-10-21 MED ORDER — SODIUM CHLORIDE 0.9 % IV SOLN
INTRAVENOUS | Status: DC
  Administered 2017-10-21: 17:00:00 via INTRAVENOUS

## 2017-10-21 MED ORDER — FUROSEMIDE 20 MG PO TABS
20.0000 mg | ORAL_TABLET | Freq: Every day | ORAL | Status: DC
Start: 2017-10-22 — End: 2017-10-23
  Administered 2017-10-22 – 2017-10-23 (×2): 20 mg via ORAL
  Filled 2017-10-21 (×2): qty 1

## 2017-10-21 MED ORDER — METHOCARBAMOL 500 MG PO TABS
500.0000 mg | ORAL_TABLET | Freq: Four times a day (QID) | ORAL | Status: DC | PRN
  Administered 2017-10-21 – 2017-10-23 (×6): 500 mg via ORAL
  Filled 2017-10-21 (×6): qty 1

## 2017-10-21 MED ORDER — METOCLOPRAMIDE HCL 5 MG/ML IJ SOLN: 5.0000 mg | Freq: Three times a day (TID) | INTRAMUSCULAR | Status: DC | PRN

## 2017-10-21 MED ORDER — MENTHOL 3 MG MT LOZG: 1.0000 | LOZENGE | OROMUCOSAL | Status: DC | PRN

## 2017-10-21 MED ORDER — GLIMEPIRIDE 4 MG PO TABS
4.0000 mg | ORAL_TABLET | Freq: Two times a day (BID) | ORAL | Status: DC
  Administered 2017-10-22 – 2017-10-23 (×3): 4 mg via ORAL
  Filled 2017-10-21 (×4): qty 1

## 2017-10-21 MED ORDER — POLYETHYLENE GLYCOL 3350 17 G PO PACK: 17.0000 g | PACK | Freq: Every day | ORAL | Status: DC | PRN

## 2017-10-21 MED ORDER — FLEET ENEMA 7-19 GM/118ML RE ENEM: 1.0000 | ENEMA | Freq: Once | RECTAL | Status: DC | PRN

## 2017-10-21 MED ORDER — LACTATED RINGERS IV SOLN
INTRAVENOUS | Status: DC
  Administered 2017-10-21 (×2): via INTRAVENOUS
  Administered 2017-10-21: 1000 mL via INTRAVENOUS

## 2017-10-21 MED ORDER — FENTANYL CITRATE (PF) 100 MCG/2ML IJ SOLN
INTRAMUSCULAR | Status: AC
  Administered 2017-10-21: 50 ug via INTRAVENOUS
  Filled 2017-10-21: qty 2

## 2017-10-21 MED ORDER — DOCUSATE SODIUM 100 MG PO CAPS
100.0000 mg | ORAL_CAPSULE | Freq: Two times a day (BID) | ORAL | Status: DC
  Administered 2017-10-21 – 2017-10-23 (×4): 100 mg via ORAL
  Filled 2017-10-21 (×4): qty 1

## 2017-10-21 MED ORDER — DEXAMETHASONE SODIUM PHOSPHATE 10 MG/ML IJ SOLN
10.0000 mg | Freq: Once | INTRAMUSCULAR | Status: AC
  Administered 2017-10-22: 10 mg via INTRAVENOUS
  Filled 2017-10-21: qty 1

## 2017-10-21 MED ORDER — METHOCARBAMOL 1000 MG/10ML IJ SOLN
500.0000 mg | Freq: Four times a day (QID) | INTRAVENOUS | Status: DC | PRN
  Administered 2017-10-21: 500 mg via INTRAVENOUS
  Filled 2017-10-21: qty 550

## 2017-10-21 MED ORDER — GABAPENTIN 300 MG PO CAPS
ORAL_CAPSULE | ORAL | Status: AC
  Filled 2017-10-21: qty 1

## 2017-10-21 MED ORDER — ACETAMINOPHEN 325 MG PO TABS
650.0000 mg | ORAL_TABLET | ORAL | Status: DC | PRN
  Administered 2017-10-22: 650 mg via ORAL
  Filled 2017-10-21: qty 2

## 2017-10-21 MED ORDER — MIDAZOLAM HCL 2 MG/2ML IJ SOLN
1.0000 mg | INTRAMUSCULAR | Status: DC | PRN
  Administered 2017-10-21: 2 mg via INTRAVENOUS

## 2017-10-21 MED ORDER — PHENOL 1.4 % MT LIQD: 1.0000 | OROMUCOSAL | Status: DC | PRN

## 2017-10-21 MED ORDER — ONDANSETRON HCL 4 MG/2ML IJ SOLN: 4.0000 mg | Freq: Four times a day (QID) | INTRAMUSCULAR | Status: DC | PRN

## 2017-10-21 MED ORDER — EPHEDRINE SULFATE 50 MG/ML IJ SOLN
INTRAMUSCULAR | Status: DC | PRN
  Administered 2017-10-21 (×2): 10 mg via INTRAVENOUS

## 2017-10-21 MED ORDER — RIVAROXABAN 10 MG PO TABS
10.0000 mg | ORAL_TABLET | Freq: Every day | ORAL | Status: DC
  Administered 2017-10-22 – 2017-10-23 (×2): 10 mg via ORAL
  Filled 2017-10-21 (×2): qty 1

## 2017-10-21 MED ORDER — ONDANSETRON HCL 4 MG/2ML IJ SOLN
INTRAMUSCULAR | Status: DC | PRN
  Administered 2017-10-21: 4 mg via INTRAVENOUS

## 2017-10-21 MED ORDER — BUPIVACAINE LIPOSOME 1.3 % IJ SUSP
20.0000 mL | Freq: Once | INTRAMUSCULAR | Status: DC
  Filled 2017-10-21: qty 20

## 2017-10-21 MED ORDER — TRAMADOL HCL 50 MG PO TABS: 50.0000 mg | ORAL_TABLET | Freq: Four times a day (QID) | ORAL | Status: DC | PRN

## 2017-10-21 MED ORDER — BUPIVACAINE IN DEXTROSE 0.75-8.25 % IT SOLN
INTRATHECAL | Status: DC | PRN
  Administered 2017-10-21: 2 mL via INTRATHECAL

## 2017-10-21 MED ORDER — MORPHINE SULFATE (PF) 4 MG/ML IV SOLN
1.0000 mg | INTRAVENOUS | Status: DC | PRN
  Administered 2017-10-21 (×2): 1 mg via INTRAVENOUS
  Filled 2017-10-21 (×3): qty 1

## 2017-10-21 MED ORDER — LOSARTAN POTASSIUM 50 MG PO TABS
100.0000 mg | ORAL_TABLET | Freq: Every evening | ORAL | Status: DC
  Administered 2017-10-21 – 2017-10-22 (×2): 100 mg via ORAL
  Filled 2017-10-21 (×2): qty 2

## 2017-10-21 MED ORDER — ACETAMINOPHEN 10 MG/ML IV SOLN
INTRAVENOUS | Status: AC
  Filled 2017-10-21: qty 100

## 2017-10-21 MED ORDER — MIDAZOLAM HCL 2 MG/2ML IJ SOLN
INTRAMUSCULAR | Status: AC
  Administered 2017-10-21: 2 mg via INTRAVENOUS
  Filled 2017-10-21: qty 2

## 2017-10-21 MED ORDER — BUPIVACAINE LIPOSOME 1.3 % IJ SUSP
INTRAMUSCULAR | Status: DC | PRN
  Administered 2017-10-21: 20 mL

## 2017-10-21 MED ORDER — CHLORHEXIDINE GLUCONATE 4 % EX LIQD: 60.0000 mL | Freq: Once | CUTANEOUS | Status: DC

## 2017-10-21 MED ORDER — PIOGLITAZONE HCL 15 MG PO TABS
15.0000 mg | ORAL_TABLET | Freq: Two times a day (BID) | ORAL | Status: DC
  Administered 2017-10-22 – 2017-10-23 (×3): 15 mg via ORAL
  Filled 2017-10-21 (×4): qty 1

## 2017-10-21 MED ORDER — PROPOFOL 10 MG/ML IV BOLUS
INTRAVENOUS | Status: DC | PRN
  Administered 2017-10-21: 20 mg via INTRAVENOUS

## 2017-10-21 MED ORDER — PHENYLEPHRINE HCL 10 MG/ML IJ SOLN
INTRAMUSCULAR | Status: DC | PRN
  Administered 2017-10-21: 80 ug via INTRAVENOUS
  Administered 2017-10-21: 120 ug via INTRAVENOUS
  Administered 2017-10-21 (×3): 80 ug via INTRAVENOUS
  Administered 2017-10-21: 120 ug via INTRAVENOUS
  Administered 2017-10-21 (×3): 80 ug via INTRAVENOUS

## 2017-10-21 MED ORDER — BISACODYL 10 MG RE SUPP: 10.0000 mg | Freq: Every day | RECTAL | Status: DC | PRN

## 2017-10-21 SURGICAL SUPPLY — 48 items
BAG SPEC THK2 15X12 ZIP CLS (MISCELLANEOUS) ×1
BAG ZIPLOCK 12X15 (MISCELLANEOUS) ×2 IMPLANT
BANDAGE ACE 6X5 VEL STRL LF (GAUZE/BANDAGES/DRESSINGS) ×2 IMPLANT
BLADE SAG 18X100X1.27 (BLADE) ×2 IMPLANT
BLADE SAW SGTL 11.0X1.19X90.0M (BLADE) ×2 IMPLANT
BOWL SMART MIX CTS (DISPOSABLE) ×2 IMPLANT
CAPT KNEE TOTAL 3 ATTUNE ×2 IMPLANT
CEMENT HV SMART SET (Cement) ×4 IMPLANT
COVER SURGICAL LIGHT HANDLE (MISCELLANEOUS) ×2 IMPLANT
CUFF TOURN SGL QUICK 34 (TOURNIQUET CUFF) ×2
CUFF TRNQT CYL 34X4X40X1 (TOURNIQUET CUFF) ×1 IMPLANT
DRAPE U-SHAPE 47X51 STRL (DRAPES) ×2 IMPLANT
DRSG ADAPTIC 3X8 NADH LF (GAUZE/BANDAGES/DRESSINGS) ×2 IMPLANT
DRSG PAD ABDOMINAL 8X10 ST (GAUZE/BANDAGES/DRESSINGS) ×2 IMPLANT
DURAPREP 26ML APPLICATOR (WOUND CARE) ×2 IMPLANT
ELECT REM PT RETURN 15FT ADLT (MISCELLANEOUS) ×2 IMPLANT
EVACUATOR 1/8 PVC DRAIN (DRAIN) ×2 IMPLANT
GAUZE SPONGE 4X4 12PLY STRL (GAUZE/BANDAGES/DRESSINGS) ×2 IMPLANT
GLOVE BIO SURGEON STRL SZ7.5 (GLOVE) IMPLANT
GLOVE BIO SURGEON STRL SZ8 (GLOVE) ×2 IMPLANT
GLOVE BIOGEL PI IND STRL 6.5 (GLOVE) IMPLANT
GLOVE BIOGEL PI IND STRL 8 (GLOVE) ×1 IMPLANT
GLOVE BIOGEL PI INDICATOR 6.5 (GLOVE)
GLOVE BIOGEL PI INDICATOR 8 (GLOVE) ×1
GLOVE SURG SS PI 6.5 STRL IVOR (GLOVE) IMPLANT
GOWN STRL REUS W/TWL LRG LVL3 (GOWN DISPOSABLE) ×2 IMPLANT
GOWN STRL REUS W/TWL XL LVL3 (GOWN DISPOSABLE) IMPLANT
HANDPIECE INTERPULSE COAX TIP (DISPOSABLE) ×2
IMMOBILIZER KNEE 20 (SOFTGOODS) ×2
IMMOBILIZER KNEE 20 THIGH 36 (SOFTGOODS) ×1 IMPLANT
MANIFOLD NEPTUNE II (INSTRUMENTS) ×2 IMPLANT
NS IRRIG 1000ML POUR BTL (IV SOLUTION) ×2 IMPLANT
PACK TOTAL KNEE CUSTOM (KITS) ×2 IMPLANT
PADDING CAST COTTON 6X4 STRL (CAST SUPPLIES) ×2 IMPLANT
POSITIONER SURGICAL ARM (MISCELLANEOUS) ×2 IMPLANT
SET HNDPC FAN SPRY TIP SCT (DISPOSABLE) ×1 IMPLANT
STRIP CLOSURE SKIN 1/2X4 (GAUZE/BANDAGES/DRESSINGS) ×2 IMPLANT
SUT MNCRL AB 4-0 PS2 18 (SUTURE) ×2 IMPLANT
SUT STRATAFIX 0 PDS 27 VIOLET (SUTURE) ×2
SUT VIC AB 2-0 CT1 27 (SUTURE) ×6
SUT VIC AB 2-0 CT1 TAPERPNT 27 (SUTURE) ×3 IMPLANT
SUTURE STRATFX 0 PDS 27 VIOLET (SUTURE) ×1 IMPLANT
SYR 30ML LL (SYRINGE) ×4 IMPLANT
TRAY FOLEY W/METER SILVER 14FR (SET/KITS/TRAYS/PACK) ×2 IMPLANT
TRAY FOLEY W/METER SILVER 16FR (SET/KITS/TRAYS/PACK) IMPLANT
WATER STERILE IRR 1000ML POUR (IV SOLUTION) ×4 IMPLANT
WRAP KNEE MAXI GEL POST OP (GAUZE/BANDAGES/DRESSINGS) ×2 IMPLANT
YANKAUER SUCT BULB TIP 10FT TU (MISCELLANEOUS) ×2 IMPLANT

## 2017-10-21 NOTE — Anesthesia Preprocedure Evaluation (Signed)
Anesthesia Evaluation  Patient identified by MRN, date of birth, ID band Patient awake    Reviewed: Allergy & Precautions, NPO status , Patient's Chart, lab work & pertinent test results  Airway Mallampati: II  TM Distance: >3 FB Neck ROM: Full    Dental no notable dental hx.    Pulmonary neg pulmonary ROS, asthma , sleep apnea ,    Pulmonary exam normal breath sounds clear to auscultation       Cardiovascular hypertension, Pt. on medications negative cardio ROS Normal cardiovascular exam Rhythm:Regular Rate:Normal     Neuro/Psych negative neurological ROS  negative psych ROS   GI/Hepatic negative GI ROS, Neg liver ROS, GERD  ,  Endo/Other  negative endocrine ROSdiabetes, Type 2, Oral Hypoglycemic AgentsMorbid obesity  Renal/GU negative Renal ROS  negative genitourinary   Musculoskeletal negative musculoskeletal ROS (+) Arthritis , Osteoarthritis,    Abdominal   Peds negative pediatric ROS (+)  Hematology negative hematology ROS (+)   Anesthesia Other Findings   Reproductive/Obstetrics negative OB ROS                             Anesthesia Physical Anesthesia Plan  ASA: III  Anesthesia Plan: Spinal   Post-op Pain Management:  Regional for Post-op pain   Induction: Intravenous  PONV Risk Score and Plan: 1 and Ondansetron  Airway Management Planned: Simple Face Mask  Additional Equipment:   Intra-op Plan:   Post-operative Plan:   Informed Consent: I have reviewed the patients History and Physical, chart, labs and discussed the procedure including the risks, benefits and alternatives for the proposed anesthesia with the patient or authorized representative who has indicated his/her understanding and acceptance.   Dental advisory given  Plan Discussed with: CRNA  Anesthesia Plan Comments:         Anesthesia Quick Evaluation

## 2017-10-21 NOTE — Progress Notes (Signed)
AssistedDr. Miller with right, ultrasound guided, knee, adductor canal block. Side rails up, monitors on throughout procedure. See vital signs in flow sheet. Tolerated Procedure well.  

## 2017-10-21 NOTE — Evaluation (Signed)
Physical Therapy Evaluation Patient Details Name: Joshua Koch MRN: [id number] DOB: October 07, 1946 Today's Date: 10/21/2017   History of Present Illness   right TKA  Clinical Impression  The patient ambulated x 60'. Plans home with OPPT. Pt admitted with above diagnosis. Pt currently with functional limitations due to the deficits listed below (see PT Problem List).  Pt will benefit from skilled PT to increase their independence and safety with mobility to allow discharge to the venue listed below.       Follow Up Recommendations Outpatient PT    Equipment Recommendations  Rolling walker with 5" wheels    Recommendations for Other Services       Precautions / Restrictions Precautions Precautions: Knee Required Braces or Orthoses: Knee Immobilizer - Right Knee Immobilizer - Right: Discontinue once straight leg raise with < 10 degree lag      Mobility  Bed Mobility Overal bed mobility: Needs Assistance Bed Mobility: Supine to Sit     Supine to sit: Min assist     General bed mobility comments: support right leg  Transfers Overall transfer level: Needs assistance Equipment used: Rolling walker (2 wheeled) Transfers: Sit to/from Stand Sit to Stand: Min assist         General transfer comment: cues for hand and right leg  Ambulation/Gait Ambulation/Gait assistance: Min assist Ambulation Distance (Feet): 60 Feet Assistive device: Rolling walker (2 wheeled) Gait Pattern/deviations: Step-to pattern     General Gait Details: cues for sequence  Stairs            Wheelchair Mobility    Modified Rankin (Stroke Patients Only)       Balance                                             Pertinent Vitals/Pain Pain Assessment: 0-10 Pain Score: 6  Pain Descriptors / Indicators: Aching Pain Intervention(s): Monitored during session;Premedicated before session;Ice applied    Home Living Family/patient expects to be discharged to:: Private  residence Living Arrangements: Spouse/significant other Available Help at Discharge: Family Type of Home: House Home Access: Stairs to enter Entrance Stairs-Rails: None Technical brewer of Steps: 3 Home Layout: One level Home Equipment: Environmental consultant - 2 wheels      Prior Function                 Hand Dominance        Extremity/Trunk Assessment   Upper Extremity Assessment Upper Extremity Assessment: Defer to OT evaluation    Lower Extremity Assessment Lower Extremity Assessment: RLE deficits/detail RLE Deficits / Details: able to oerform SLR    Cervical / Trunk Assessment Cervical / Trunk Assessment: Normal  Communication      Cognition Arousal/Alertness: Awake/alert Behavior During Therapy: WFL for tasks assessed/performed Overall Cognitive Status: Within Functional Limits for tasks assessed                                        General Comments      Exercises     Assessment/Plan    PT Assessment Patient needs continued PT services  PT Problem List Decreased strength;Decreased range of motion;Decreased activity tolerance;Decreased balance;Decreased mobility;Decreased knowledge of precautions;Decreased safety awareness;Pain;Decreased knowledge of use of DME       PT Treatment Interventions DME instruction;Therapeutic exercise;Gait  training;Stair training;Functional mobility training;Therapeutic activities;Patient/family education    PT Goals (Current goals can be found in the Care Plan section)  Acute Rehab PT Goals Patient Stated Goal: to go home PT Goal Formulation: With patient/family Time For Goal Achievement: 10/25/17 Potential to Achieve Goals: Good    Frequency 7X/week   Barriers to discharge        Co-evaluation               AM-PAC PT "6 Clicks" Daily Activity  Outcome Measure Difficulty turning over in bed (including adjusting bedclothes, sheets and blankets)?: A Little Difficulty moving from lying on back  to sitting on the side of the bed? : A Little Difficulty sitting down on and standing up from a chair with arms (e.g., wheelchair, bedside commode, etc,.)?: A Little Help needed moving to and from a bed to chair (including a wheelchair)?: A Little Help needed walking in hospital room?: A Lot Help needed climbing 3-5 steps with a railing? : A Lot 6 Click Score: 16    End of Session Equipment Utilized During Treatment: Right knee immobilizer Activity Tolerance: Patient tolerated treatment well Patient left: in chair;with call bell/phone within reach;with family/visitor present Nurse Communication: Mobility status PT Visit Diagnosis: Difficulty in walking, not elsewhere classified (R26.2);Pain Pain - Right/Left: Right Pain - part of body: Knee    Time: 6381-7711 PT Time Calculation (min) (ACUTE ONLY): 17 min   Charges:   PT Evaluation $PT Eval Low Complexity: 1 Low     PT G CodesTresa Endo PT [phone]   Claretha Cooper 10/21/2017, 5:36 PM

## 2017-10-21 NOTE — Discharge Instructions (Addendum)
° °Dr. Frank Aluisio °Total Joint Specialist °[facility] °[address] °[place], [place] [address] °[phone] ° °TOTAL KNEE REPLACEMENT POSTOPERATIVE DIRECTIONS ° °Knee Rehabilitation, Guidelines Following Surgery  °Results after knee surgery are often greatly improved when you follow the exercise, range of motion and muscle strengthening exercises prescribed by your doctor. Safety measures are also important to protect the knee from further injury. Any time any of these exercises cause you to have increased pain or swelling in your knee joint, decrease the amount until you are comfortable again and slowly increase them. If you have problems or questions, call your caregiver or physical therapist for advice.  ° °HOME CARE INSTRUCTIONS  °Remove items at home which could result in a fall. This includes throw rugs or furniture in walking pathways.  °· ICE to the affected knee every three hours for 30 minutes at a time and then as needed for pain and swelling.  Continue to use ice on the knee for pain and swelling from surgery. You may notice swelling that will progress down to the foot and ankle.  This is normal after surgery.  Elevate the leg when you are not up walking on it.   °· Continue to use the breathing machine which will help keep your temperature down.  It is common for your temperature to cycle up and down following surgery, especially at night when you are not up moving around and exerting yourself.  The breathing machine keeps your lungs expanded and your temperature down. °· Do not place pillow under knee, focus on keeping the knee straight while resting ° °DIET °You may resume your previous home diet once your are discharged from the hospital. ° °DRESSING / WOUND CARE / SHOWERING °You may shower 3 days after surgery, but keep the wounds dry during showering.  You may use an occlusive plastic wrap (Press'n Seal for example), NO SOAKING/SUBMERGING IN THE BATHTUB.  If the  bandage gets wet, change with a clean dry gauze.  If the incision gets wet, pat the wound dry with a clean towel. °You may start showering once you are discharged home but do not submerge the incision under water. Just pat the incision dry and apply a dry gauze dressing on daily. °Change the surgical dressing daily and reapply a dry dressing each time. ° °ACTIVITY °Walk with your walker as instructed. °Use walker as long as suggested by your caregivers. °Avoid periods of inactivity such as sitting longer than an hour when not asleep. This helps prevent blood clots.  °You may resume a sexual relationship in one month or when given the OK by your doctor.  °You may return to work once you are cleared by your doctor.  °Do not drive a car for 6 weeks or until released by you surgeon.  °Do not drive while taking narcotics. ° °WEIGHT BEARING °Weight bearing as tolerated with assist device (walker, cane, etc) as directed, use it as long as suggested by your surgeon or therapist, typically at least 4-6 weeks. ° °POSTOPERATIVE CONSTIPATION PROTOCOL °Constipation - defined medically as fewer than three stools per week and severe constipation as less than one stool per week. ° °One of the most common issues patients have following surgery is constipation.  Even if you have a regular bowel pattern at home, your normal regimen is likely to be disrupted due to multiple reasons following surgery.  Combination of anesthesia, postoperative narcotics, change in appetite and fluid intake all can affect your bowels.    In order to avoid complications following surgery, here are some recommendations in order to help you during your recovery period. ° °Colace (docusate) - Pick up an over-the-counter form of Colace or another stool softener and take twice a day as long as you are requiring postoperative pain medications.  Take with a full glass of water daily.  If you experience loose stools or diarrhea, hold the colace until you stool forms  back up.  If your symptoms do not get better within 1 week or if they get worse, check with your doctor. ° °Dulcolax (bisacodyl) - Pick up over-the-counter and take as directed by the product packaging as needed to assist with the movement of your bowels.  Take with a full glass of water.  Use this product as needed if not relieved by Colace only.  ° °MiraLax (polyethylene glycol) - Pick up over-the-counter to have on hand.  MiraLax is a solution that will increase the amount of water in your bowels to assist with bowel movements.  Take as directed and can mix with a glass of water, juice, soda, coffee, or tea.  Take if you go more than two days without a movement. °Do not use MiraLax more than once per day. Call your doctor if you are still constipated or irregular after using this medication for 7 days in a row. ° °If you continue to have problems with postoperative constipation, please contact the office for further assistance and recommendations.  If you experience "the worst abdominal pain ever" or develop nausea or vomiting, please contact the office immediatly for further recommendations for treatment. ° °ITCHING ° If you experience itching with your medications, try taking only a single pain pill, or even half a pain pill at a time.  You can also use Benadryl over the counter for itching or also to help with sleep.  ° °TED HOSE STOCKINGS °Wear the elastic stockings on both legs for three weeks following surgery during the day but you may remove then at night for sleeping. ° °MEDICATIONS °See your medication summary on the “After Visit Summary” that the nursing staff will review with you prior to discharge.  You may have some home medications which will be placed on hold until you complete the course of blood thinner medication.  It is important for you to complete the blood thinner medication as prescribed by your surgeon.  Continue your approved medications as instructed at time of  discharge. ° °PRECAUTIONS °If you experience chest pain or shortness of breath - call 911 immediately for transfer to the hospital emergency department.  °If you develop a fever greater that 101 F, purulent drainage from wound, increased redness or drainage from wound, foul odor from the wound/dressing, or calf pain - CONTACT YOUR SURGEON.   °                                                °FOLLOW-UP APPOINTMENTS °Make sure you keep all of your appointments after your operation with your surgeon and caregivers. You should call the office at the above phone number and make an appointment for approximately two weeks after the date of your surgery or on the date instructed by your surgeon outlined in the "After Visit Summary". ° ° °RANGE OF MOTION AND STRENGTHENING EXERCISES  °Rehabilitation of the knee is important following a knee injury or   an operation. After just a few days of immobilization, the muscles of the thigh which control the knee become weakened and shrink (atrophy). Knee exercises are designed to build up the tone and strength of the thigh muscles and to improve knee motion. Often times heat used for twenty to thirty minutes before working out will loosen up your tissues and help with improving the range of motion but do not use heat for the first two weeks following surgery. These exercises can be done on a training (exercise) mat, on the floor, on a table or on a bed. Use what ever works the best and is most comfortable for you Knee exercises include:  °Leg Lifts - While your knee is still immobilized in a splint or cast, you can do straight leg raises. Lift the leg to 60 degrees, hold for 3 sec, and slowly lower the leg. Repeat 10-20 times 2-3 times daily. Perform this exercise against resistance later as your knee gets better.  °Quad and Hamstring Sets - Tighten up the muscle on the front of the thigh (Quad) and hold for 5-10 sec. Repeat this 10-20 times hourly. Hamstring sets are done by pushing the  foot backward against an object and holding for 5-10 sec. Repeat as with quad sets.  °· Leg Slides: Lying on your back, slowly slide your foot toward your buttocks, bending your knee up off the floor (only go as far as is comfortable). Then slowly slide your foot back down until your leg is flat on the floor again. °· Angel Wings: Lying on your back spread your legs to the side as far apart as you can without causing discomfort.  °A rehabilitation program following serious knee injuries can speed recovery and prevent re-injury in the future due to weakened muscles. Contact your doctor or a physical therapist for more information on knee rehabilitation.  ° °IF YOU ARE TRANSFERRED TO A SKILLED REHAB FACILITY °If the patient is transferred to a skilled rehab facility following release from the hospital, a list of the current medications will be sent to the facility for the patient to continue.  When discharged from the skilled rehab facility, please have the facility set up the patient's Home Health Physical Therapy prior to being released. Also, the skilled facility will be responsible for providing the patient with their medications at time of release from the facility to include their pain medication, the muscle relaxants, and their blood thinner medication. If the patient is still at the rehab facility at time of the two week follow up appointment, the skilled rehab facility will also need to assist the patient in arranging follow up appointment in our office and any transportation needs. ° °MAKE SURE YOU:  °Understand these instructions.  °Get help right away if you are not doing well or get worse.  ° ° °Pick up stool softner and laxative for home use following surgery while on pain medications. °Do not submerge incision under water. °Please use good hand washing techniques while changing dressing each day. °May shower starting three days after surgery. °Please use a clean towel to pat the incision dry following  showers. °Continue to use ice for pain and swelling after surgery. °Do not use any lotions or creams on the incision until instructed by your surgeon. ° °Take Xarelto for two and a half more weeks following discharge from the hospital, then discontinue Xarelto. °Once the patient has completed the Xarelto, they may resume the 81 mg Aspirin. ° ° ° °Information on   my medicine - XARELTO® (Rivaroxaban) ° °Why was Xarelto® prescribed for you? °Xarelto® was prescribed for you to reduce the risk of blood clots forming after orthopedic surgery. The medical term for these abnormal blood clots is venous thromboembolism (VTE). ° °What do you need to know about xarelto® ? °Take your Xarelto® ONCE DAILY at the same time every day. °You may take it either with or without food. ° °If you have difficulty swallowing the tablet whole, you may crush it and mix in applesauce just prior to taking your dose. ° °Take Xarelto® exactly as prescribed by your doctor and DO NOT stop taking Xarelto® without talking to the doctor who prescribed the medication.  Stopping without other VTE prevention medication to take the place of Xarelto® may increase your risk of developing a clot. ° °After discharge, you should have regular check-up appointments with your healthcare provider that is prescribing your Xarelto®.   ° °What do you do if you miss a dose? °If you miss a dose, take it as soon as you remember on the same day then continue your regularly scheduled once daily regimen the next day. Do not take two doses of Xarelto® on the same day.  ° °Important Safety Information °A possible side effect of Xarelto® is bleeding. You should call your healthcare provider right away if you experience any of the following: °? Bleeding from an injury or your nose that does not stop. °? Unusual colored urine (red or dark brown) or unusual colored stools (red or black). °? Unusual bruising for unknown reasons. °? A serious fall or if you hit your head (even if  there is no bleeding). ° °Some medicines may interact with Xarelto® and might increase your risk of bleeding while on Xarelto®. To help avoid this, consult your healthcare provider or pharmacist prior to using any new prescription or non-prescription medications, including herbals, vitamins, non-steroidal anti-inflammatory drugs (NSAIDs) and supplements. ° °This website has more information on Xarelto®: www.xarelto.com. ° ° ° °

## 2017-10-21 NOTE — Anesthesia Procedure Notes (Addendum)
Spinal  Patient location during procedure: OR Start time: 08/30/2017 10:00 AM End time: 09/05/2017 10:00 AM Reason for block: at surgeon's request Staffing Resident/CRNA: West Pugh, CRNA Performed: resident/CRNA  Preanesthetic Checklist Completed: patient identified, site marked, surgical consent, pre-op evaluation, timeout performed, IV checked, risks and benefits discussed and monitors and equipment checked Spinal Block Patient position: sitting Prep: DuraPrep Patient monitoring: heart rate, continuous pulse ox and blood pressure Approach: right paramedian Location: L3-4 Injection technique: single-shot Needle Needle type: Pencan  Needle gauge: 24 G Needle length: 10 cm Assessment Sensory level: T4 Additional Notes Expiration of kit checked and confirmed. Patient tolerated procedure well,without complications noted clear CSF pre injection, mid injection and post injection. Loss of motor and sensory on exam post injection.

## 2017-10-21 NOTE — Anesthesia Procedure Notes (Signed)
Anesthesia Regional Block: Adductor canal block   Pre-Anesthetic Checklist: ,, timeout performed, Correct Patient, Correct Site, Correct Laterality, Correct Procedure, Correct Position, site marked, Risks and benefits discussed,  Surgical consent,  Pre-op evaluation,  At surgeon's request and post-op pain management  Laterality: Right  Prep: chloraprep       Needles:  Injection technique: Single-shot  Needle Type: Stimiplex     Needle Length: 9cm  Needle Gauge: 21     Additional Needles:   Procedures:,,,, ultrasound used (permanent image in chart),,,,  Narrative:  Start time: 10/21/2017 8:48 AM End time: 10/21/2017 8:53 AM Injection made incrementally with aspirations every 5 mL.  Performed by: Personally  Anesthesiologist: Lynda Rainwater, MD

## 2017-10-21 NOTE — Anesthesia Postprocedure Evaluation (Signed)
Anesthesia Post Note  Patient: Joshua Koch  Procedure(s) Performed: RIGHT TOTAL KNEE ARTHROPLASTY (Right Knee)     Patient location during evaluation: PACU Anesthesia Type: Spinal Level of consciousness: oriented and awake and alert Pain management: pain level controlled Vital Signs Assessment: post-procedure vital signs reviewed and stable Respiratory status: spontaneous breathing and respiratory function stable Cardiovascular status: blood pressure returned to baseline and stable Postop Assessment: no headache, no backache and no apparent nausea or vomiting Anesthetic complications: no    Last Vitals:  Vitals:   10/21/17 1215 10/21/17 1230  BP: 120/61 128/64  Pulse: 60 60  Resp: 14 19  Temp:    SpO2: 100% 100%    Last Pain:  Vitals:   10/21/17 1051  TempSrc:   PainSc: 0-No pain    LLE Motor Response: No movement due to regional block (10/21/17 1230) LLE Sensation: Numbness;Tingling;No pain (10/21/17 1230) RLE Motor Response: No movement due to regional block (10/21/17 1230) RLE Sensation: Numbness;Tingling;No pain (10/21/17 1230) L Sensory Level: T12-Inguinal (groin) region (10/21/17 1230) R Sensory Level: T12-Inguinal (groin) region (10/21/17 1230)  Lynda Rainwater

## 2017-10-21 NOTE — Interval H&P Note (Signed)
History and Physical Interval Note:  10/21/2017 7:25 AM  Joshua Koch  has presented today for surgery, with the diagnosis of Osteoarthritis Right knee  The various methods of treatment have been discussed with the patient and family. After consideration of risks, benefits and other options for treatment, the patient has consented to  Procedure(s): RIGHT TOTAL KNEE ARTHROPLASTY (Right) as a surgical intervention .  The patient's history has been reviewed, patient examined, no change in status, stable for surgery.  I have reviewed the patient's chart and labs.  Questions were answered to the patient's satisfaction.     Gearlean Alf

## 2017-10-21 NOTE — Anesthesia Procedure Notes (Signed)
Procedure Name: MAC Date/Time: 10/21/2017 9:20 AM Performed by: West Pugh, CRNA Pre-anesthesia Checklist: Patient identified, Emergency Drugs available, Suction available, Patient being monitored and Timeout performed Oxygen Delivery Method: Simple face mask Placement Confirmation: CO2 detector and breath sounds checked- equal and bilateral Dental Injury: Teeth and Oropharynx as per pre-operative assessment

## 2017-10-21 NOTE — Op Note (Signed)
OPERATIVE REPORT-TOTAL KNEE ARTHROPLASTY   Pre-operative diagnosis- Osteoarthritis  Right knee(s)  Post-operative diagnosis- Osteoarthritis Right knee(s)  Procedure-  Right  Total Knee Arthroplasty (Depuy Attune)  Surgeon- Dione Plover. Charrisse Masley, MD  Assistant- Ardeen Jourdain, PA-C   Anesthesia-  Adductor canal block and spinal  EBL-75 mL   Drains Hemovac  Tourniquet time- 38 minutes @ 062 mm Hg  Complications- None  Condition-PACU - hemodynamically stable.   Brief Clinical Note  Joshua Koch is a 71 y.o. year old male with end stage OA of his right knee with progressively worsening pain and dysfunction. He has constant pain, with activity and at rest and significant functional deficits with difficulties even with ADLs. He has had extensive non-op management including analgesics, injections of cortisone, and home exercise program, but remains in significant pain with significant dysfunction. Radiographs show bone on bone arthritis medial and patellofemoral. He presents now for right Total Knee Arthroplasty.    Procedure in detail---   The patient is brought into the operating room and positioned supine on the operating table. After successful administration of  Adductor canal block and spinal,   a tourniquet is placed high on the  Right thigh(s) and the lower extremity is prepped and draped in the usual sterile fashion. Time out is performed by the operating team and then the  Right lower extremity is wrapped in Esmarch, knee flexed and the tourniquet inflated to 300 mmHg.       A midline incision is made with a ten blade through the subcutaneous tissue to the level of the extensor mechanism. A fresh blade is used to make a medial parapatellar arthrotomy. Soft tissue over the proximal medial tibia is subperiosteally elevated to the joint line with a knife and into the semimembranosus bursa with a Cobb elevator. Soft tissue over the proximal lateral tibia is elevated with attention being  paid to avoiding the patellar tendon on the tibial tubercle. The patella is everted, knee flexed 90 degrees and the ACL and PCL are removed. Findings are bone on bone medial and patellofemoral with large global osteophytes.        The drill is used to create a starting hole in the distal femur and the canal is thoroughly irrigated with sterile saline to remove the fatty contents. The 5 degree Right  valgus alignment guide is placed into the femoral canal and the distal femoral cutting block is pinned to remove 9 mm off the distal femur. Resection is made with an oscillating saw.      The tibia is subluxed forward and the menisci are removed. The extramedullary alignment guide is placed referencing proximally at the medial aspect of the tibial tubercle and distally along the second metatarsal axis and tibial crest. The block is pinned to remove 71mm off the more deficient medial  side. Resection is made with an oscillating saw. Size 8is the most appropriate size for the tibia and the proximal tibia is prepared with the modular drill and keel punch for that size.      The femoral sizing guide is placed and size 7 is most appropriate. Rotation is marked off the epicondylar axis and confirmed by creating a rectangular flexion gap at 90 degrees. The size 7 cutting block is pinned in this rotation and the anterior, posterior and chamfer cuts are made with the oscillating saw. The intercondylar block is then placed and that cut is made.      Trial size 8 tibial component, trial size 7 posterior  stabilized femur and a 8  mm posterior stabilized rotating platform insert trial is placed. Full extension is achieved with excellent varus/valgus and anterior/posterior balance throughout full range of motion. The patella is everted and thickness measured to be 27  mm. Free hand resection is taken to 15 mm, a 41 template is placed, lug holes are drilled, trial patella is placed, and it tracks normally. Osteophytes are removed  off the posterior femur with the trial in place. All trials are removed and the cut bone surfaces prepared with pulsatile lavage. Cement is mixed and once ready for implantation, the size 8 tibial implant, size  7 posterior stabilized femoral component, and the size 41 patella are cemented in place and the patella is held with the clamp. The trial insert is placed and the knee held in full extension. The Exparel (20 ml mixed with 60 ml saline) is injected into the extensor mechanism, posterior capsule, medial and lateral gutters and subcutaneous tissues.  All extruded cement is removed and once the cement is hard the permanent 8 mm posterior stabilized rotating platform insert is placed into the tibial tray.      The wound is copiously irrigated with saline solution and the extensor mechanism closed over a hemovac drain with #1 V-loc suture. The tourniquet is released for a total tourniquet time of 38  minutes. Flexion against gravity is 140 degrees and the patella tracks normally. Subcutaneous tissue is closed with 2.0 vicryl and subcuticular with running 4.0 Monocryl. The incision is cleaned and dried and steri-strips and a bulky sterile dressing are applied. The limb is placed into a knee immobilizer and the patient is awakened and transported to recovery in stable condition.      Please note that a surgical assistant was a medical necessity for this procedure in order to perform it in a safe and expeditious manner. Surgical assistant was necessary to retract the ligaments and vital neurovascular structures to prevent injury to them and also necessary for proper positioning of the limb to allow for anatomic placement of the prosthesis.   Dione Plover Shefali Ng, MD    10/21/2017, 10:31 AM

## 2017-10-21 NOTE — Transfer of Care (Signed)
Immediate Anesthesia Transfer of Care Note  Patient: Joshua Koch  Procedure(s) Performed: RIGHT TOTAL KNEE ARTHROPLASTY (Right Knee)  Patient Location: PACU  Anesthesia Type:Spinal and MAC combined with regional for post-op pain  Level of Consciousness: alert , oriented and patient cooperative  Airway & Oxygen Therapy: Patient Spontanous Breathing and Patient connected to nasal cannula oxygen  Post-op Assessment: report given to RN. Vital signs stable and reviewed.  Post vital signs: Reviewed and stable  Last Vitals:  Vitals:   10/21/17 0910 10/21/17 1051  BP:  117/61  Pulse: 64 74  Resp: 14 14  Temp:  36.4 C  SpO2: 100%     Last Pain:  Vitals:   10/21/17 0700  TempSrc: Oral      Patients Stated Pain Goal: 4 (77/93/90 3009)  Complications: No apparent anesthesia complications

## 2017-10-22 ENCOUNTER — Other Ambulatory Visit: Payer: Self-pay

## 2017-10-22 LAB — BASIC METABOLIC PANEL
Anion gap: 4 — ABNORMAL LOW (ref 5–15)
BUN: 20 mg/dL (ref 6–20)
CALCIUM: 8.5 mg/dL — AB (ref 8.9–10.3)
CO2: 24 mmol/L (ref 22–32)
CREATININE: 1.06 mg/dL (ref 0.61–1.24)
Chloride: 108 mmol/L (ref 101–111)
GFR calc non Af Amer: >60 mL/min (ref >60)
GLUCOSE: 243 mg/dL — AB (ref 65–99)
Potassium: 4.3 mmol/L (ref 3.5–5.1)
Sodium: 136 mmol/L (ref 135–145)

## 2017-10-22 LAB — CBC
HEMATOCRIT: 35.8 % — AB (ref 39.0–52.0)
Hemoglobin: 11.9 g/dL — ABNORMAL LOW (ref 13.0–17.0)
MCH: 30.1 pg (ref 26.0–34.0)
MCHC: 33.2 g/dL (ref 30.0–36.0)
MCV: 90.4 fL (ref 78.0–100.0)
Platelets: 252 10*3/uL (ref 150–400)
RBC: 3.96 MIL/uL — AB (ref 4.22–5.81)
RDW: 13.5 % (ref 11.5–15.5)
WBC: 14.3 10*3/uL — AB (ref 4.0–10.5)

## 2017-10-22 LAB — GLUCOSE, CAPILLARY
Glucose-Capillary: 184 mg/dL — ABNORMAL HIGH (ref 65–99)
Glucose-Capillary: 173 mg/dL — ABNORMAL HIGH (ref 65–99)
Glucose-Capillary: 235 mg/dL — ABNORMAL HIGH (ref 65–99)
Glucose-Capillary: 189 mg/dL — ABNORMAL HIGH (ref 65–99)

## 2017-10-22 MED ORDER — RIVAROXABAN 10 MG PO TABS: 10.0000 mg | ORAL_TABLET | Freq: Every day | ORAL | 0 refills | Status: DC

## 2017-10-22 MED ORDER — METHOCARBAMOL 500 MG PO TABS: 500.0000 mg | ORAL_TABLET | Freq: Four times a day (QID) | ORAL | 0 refills | Status: DC | PRN

## 2017-10-22 MED ORDER — TRAMADOL HCL 50 MG PO TABS: 50.0000 mg | ORAL_TABLET | Freq: Four times a day (QID) | ORAL | 0 refills | Status: DC | PRN

## 2017-10-22 MED ORDER — OXYCODONE HCL 5 MG PO TABS: 5.0000 mg | ORAL_TABLET | ORAL | 0 refills | Status: DC | PRN

## 2017-10-22 MED ORDER — PANTOPRAZOLE SODIUM 20 MG PO TBEC
20.0000 mg | DELAYED_RELEASE_TABLET | Freq: Every day | ORAL | Status: DC
  Administered 2017-10-22 – 2017-10-23 (×2): 20 mg via ORAL
  Filled 2017-10-22 (×2): qty 1

## 2017-10-22 NOTE — Evaluation (Signed)
Occupational Therapy Evaluation Patient Details Name: Joshua Koch MRN: [id number] DOB: Oct 23, 1946 Today's Date: 10/22/2017    History of Present Illness  right TKA   Clinical Impression   Pt is s/p TKA resulting in the deficits listed below (see OT Problem List).  Pt will benefit from skilled OT to increase their safety and independence with ADL and functional mobility for ADL to facilitate discharge to venue listed below.        Follow Up Recommendations  No OT follow up    Equipment Recommendations  None recommended by OT       Precautions / Restrictions Precautions Precautions: Knee Required Braces or Orthoses: Knee Immobilizer - Right Knee Immobilizer - Right: Discontinue once straight leg raise with < 10 degree lag Restrictions Weight Bearing Restrictions: No      Mobility Bed Mobility Overal bed mobility: Needs Assistance Bed Mobility: Sit to Supine     Supine to sit: Min assist Sit to supine: Min assist   General bed mobility comments: support with right LE   Transfers Overall transfer level: Needs assistance Equipment used: Rolling walker (2 wheeled) Transfers: Sit to/from Stand Sit to Stand: Min assist;From elevated surface         General transfer comment: 25% VC's with sit to stand for hand placement         ADL either performed or assessed with clinical judgement   ADL Overall ADL's : Needs assistance/impaired Eating/Feeding: Set up   Grooming: Set up;Sitting   Upper Body Bathing: Set up;Sitting   Lower Body Bathing: Minimal assistance;Sit to/from stand   Upper Body Dressing : Set up;Sitting   Lower Body Dressing: Minimal assistance;Sit to/from stand   Toilet Transfer: Set up;RW;Comfort height toilet   Toileting- Clothing Manipulation and Hygiene: Minimal assistance;Sit to/from Nurse, children's Details (indicate cue type and reason): verbalized safety Functional mobility during ADLs: Minimal assistance;Rolling  walker       Vision Patient Visual Report: No change from baseline       Perception     Praxis      Pertinent Vitals/Pain Pain Assessment: 0-10 Pain Score: 3  Pain Location: R knee Pain Descriptors / Indicators: Aching;Sharp Pain Intervention(s): Limited activity within patient's tolerance     Hand Dominance     Extremity/Trunk Assessment Upper Extremity Assessment Upper Extremity Assessment: Overall WFL for tasks assessed           Communication Communication Communication: No difficulties   Cognition Arousal/Alertness: Awake/alert Behavior During Therapy: WFL for tasks assessed/performed Overall Cognitive Status: Within Functional Limits for tasks assessed                                                Home Living Family/patient expects to be discharged to:: Private residence Living Arrangements: Spouse/significant other Available Help at Discharge: Family Type of Home: House Home Access: Stairs to enter Technical brewer of Steps: 3 Entrance Stairs-Rails: None Home Layout: One level               Home Equipment: Environmental consultant - 2 wheels          Prior Functioning/Environment Level of Independence: Independent                 OT Problem List: Decreased strength;Decreased activity tolerance;Decreased knowledge of use of DME or AE  OT Treatment/Interventions: Self-care/ADL training;Patient/family education    OT Goals(Current goals can be found in the care plan section) Acute Rehab OT Goals Patient Stated Goal: to go home OT Goal Formulation: With patient Time For Goal Achievement: 11/05/17  OT Frequency: Min 2X/week              AM-PAC PT "6 Clicks" Daily Activity     Outcome Measure Help from another person eating meals?: None Help from another person taking care of personal grooming?: None Help from another person toileting, which includes using toliet, bedpan, or urinal?: A Little Help from another  person bathing (including washing, rinsing, drying)?: A Little Help from another person to put on and taking off regular upper body clothing?: None Help from another person to put on and taking off regular lower body clothing?: A Little 6 Click Score: 21   End of Session Equipment Utilized During Treatment: Rolling walker Nurse Communication: Mobility status  Activity Tolerance: Patient tolerated treatment well Patient left: in bed  OT Visit Diagnosis: Unsteadiness on feet (R26.81)                Time: 6861-6837 OT Time Calculation (min): 14 min Charges:  OT General Charges $OT Visit: 1 Visit OT Evaluation $OT Eval Low Complexity: 1 Low G-Codes:     Kari Baars, [place]  Payton Mccallum D 10/22/2017, 12:12 PM

## 2017-10-22 NOTE — Progress Notes (Signed)
POD # 1 am session Physical Therapy Treatment Patient Details Name: Joshua Koch MRN: [id number] DOB: [birth year] Today's Date: 10/22/2017    History of Present Illness  right TKA    PT Comments    POD # 1 am session Min assist with R LE for bed mobility and transfers, VC's for hand placement. Ambulated 50 feet with min guard. Patient performed HEP with min assist of R LE. Educated patient and spouse on [place] use. Educated patient on ice and heat. Positioned patient in recliner.    Follow Up Recommendations        Equipment Recommendations       Recommendations for Other Services       Precautions / Restrictions Precautions Precautions: Knee Required Braces or Orthoses: Knee Immobilizer - Right Knee Immobilizer - Right: Discontinue once straight leg raise with < 10 degree lag Restrictions Weight Bearing Restrictions: No    Mobility  Bed Mobility Overal bed mobility: Needs Assistance Bed Mobility: Supine to Sit     Supine to sit: Min assist     General bed mobility comments: support with right LE   Transfers Overall transfer level: Needs assistance Equipment used: Rolling walker (2 wheeled) Transfers: Sit to/from Stand Sit to Stand: Min assist;From elevated surface         General transfer comment: 25% VC's with sit to stand for hand placement   Ambulation/Gait Ambulation/Gait assistance: Min guard Ambulation Distance (Feet): 50 Feet Assistive device: Rolling walker (2 wheeled) Gait Pattern/deviations: Step-to pattern;Decreased stance time - right;Decreased step length - right   Gait velocity interpretation: Below normal speed for age/gender General Gait Details: 25% VC's for sequence    Stairs            Wheelchair Mobility    Modified Rankin (Stroke Patients Only)       Balance                                            Cognition Arousal/Alertness: Awake/alert Behavior During Therapy: WFL for tasks  assessed/performed Overall Cognitive Status: Within Functional Limits for tasks assessed                                        Exercises Total Joint Exercises Ankle Circles/Pumps: AROM;20 reps;Both;Seated Quad Sets: AROM;Right;10 reps;Seated Towel Squeeze: AROM;Right;Seated;10 reps Short Arc Quad: Right;10 reps;AAROM;Seated Heel Slides: AAROM;Right;10 reps;Supine Hip ABduction/ADduction: AAROM;Right;10 reps;Supine Straight Leg Raises: AAROM;Right;10 reps;Supine    General Comments        Pertinent Vitals/Pain Pain Assessment: 0-10 Pain Score: 6  Pain Location: R knee and quad Pain Descriptors / Indicators: Aching;Sharp Pain Intervention(s): Monitored during session;Ice applied;Premedicated before session    Home Living                      Prior Function            PT Goals (current goals can now be found in the care plan section)      Frequency           PT Plan      Co-evaluation              AM-PAC PT "6 Clicks" Daily Activity  Outcome Measure  End of Session Equipment Utilized During Treatment: Right knee immobilizer;Gait belt Activity Tolerance: Patient tolerated treatment well Patient left: in chair;with call bell/phone within reach;with family/visitor present Nurse Communication: Mobility status       Time:  - 9:30 - 10:00    Charges:     1 gt    1 te                   G Codes:       Almond Lint, SPTA [facility] [place]  PTA [facility]  Acute  Rehab Pager      [phone]

## 2017-10-22 NOTE — Progress Notes (Signed)
   Subjective: 1 Day Post-Op Procedure(s) (LRB): RIGHT TOTAL KNEE ARTHROPLASTY (Right) Patient reports pain as mild.   Patient seen in morning rounds for Dr. Wynelle Link earlier today. He walked 60 feet on the day of surgery. Patient is well, but has had some minor complaints of pain in the knee, requiring pain medications We will start therapy today.  Plan is to go Home after hospital stay.  Objective: Vital signs in last 24 hours: Temp:  [97.8 F (36.6 C)-98.6 F (37 C)] 98.1 F (36.7 C) (11/13 1341) Pulse Rate:  [57-80] 67 (11/13 1341) Resp:  [16-18] 18 (11/13 1341) BP: (127-151)/(42-57) 151/57 (11/13 1341) SpO2:  [97 %-100 %] 97 % (11/13 1341)  Intake/Output from previous day:  Intake/Output Summary (Last 24 hours) at 10/22/2017 1832 Last data filed at 10/22/2017 1723 Gross per 24 hour  Intake 3180 ml  Output 1855 ml  Net 1325 ml    Intake/Output this shift: Total I/O In: 720 [P.O.:720] Out: 700 [Urine:700]  Labs: Recent Labs    10/22/17 0532  HGB 11.9*   Recent Labs    10/22/17 0532  WBC 14.3*  RBC 3.96*  HCT 35.8*  PLT 252   Recent Labs    10/22/17 0532  NA 136  K 4.3  CL 108  CO2 24  BUN 20  CREATININE 1.06  GLUCOSE 243*  CALCIUM 8.5*   No results for input(s): LABPT, INR in the last 72 hours.  EXAM General - Patient is Alert, Appropriate and Oriented Extremity - Neurovascular intact Sensation intact distally Intact pulses distally Dorsiflexion/Plantar flexion intact Dressing - dressing C/D/I Motor Function - intact, moving foot and toes well on exam.  Hemovac pulled without difficulty.  Past Medical History:  Diagnosis Date  . Arthritis   . Asthma    childhood  . 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 Diabetes mellitus   . Diabetes mellitus type 2 with neurological manifestations ([place]) 10/04/2014  . Diabetes mellitus without complication ([place])   . Dyspnea   . Erectile dysfunction   . GERD (gastroesophageal reflux disease)    . Headache   . Heart murmur   . History of kidney stones   . Hypercholesteremia   . Hyperlipemia   . Hypertension   . Kidney stone   . Low back pain   . Obesity   . Rotator cuff dysfunction    partial tear , left shoulder  . Sleep apnea     Assessment/Plan: 1 Day Post-Op Procedure(s) (LRB): RIGHT TOTAL KNEE ARTHROPLASTY (Right) Principal Problem:   OA (osteoarthritis) of knee  Estimated body mass index is 41.14 kg/m as calculated from the following:   Height as of this encounter: 5\' 11"  (1.803 m).   Weight as of this encounter: 133.8 kg (295 lb). Advance diet Up with therapy Plan for discharge tomorrow Discharge home with home health  DVT Prophylaxis - Xarelto Weight-Bearing as tolerated to right leg D/C O2 and Pulse OX and try on Room Air  Arlee Muslim, PA-C Orthopaedic Surgery 10/22/2017, 6:32 PM

## 2017-10-22 NOTE — Progress Notes (Signed)
Physical Therapy Treatment Patient Details Name: Joshua Koch MRN: [id number] DOB: 1946-04-12 Today's Date: 10/22/2017    History of Present Illness  right TKA    PT Comments    POD # 1 pm session Applied KI and instructed pt and spouse on use for amb "just first few days and stairs" for increased support.  Assisted with amb a greater distance in hallway then returned to bed for CPM.   Follow Up Recommendations  Outpatient PT     Equipment Recommendations  Rolling walker with 5" wheels    Recommendations for Other Services       Precautions / Restrictions Precautions Precautions: Knee Required Braces or Orthoses: Knee Immobilizer - Right Knee Immobilizer - Right: Discontinue once straight leg raise with < 10 degree lag Restrictions Weight Bearing Restrictions: No    Mobility  Bed Mobility Overal bed mobility: Needs Assistance Bed Mobility: Sit to Supine  +   supine to sit       Sit to supine: Min assist   General bed mobility comments: support with right LE   Transfers Overall transfer level: Needs assistance Equipment used: Rolling walker (2 wheeled) Transfers: Sit to/from Stand Sit to Stand: Min assist;From elevated surface         General transfer comment: 25% VC's with sit to stand for hand placement   Ambulation/Gait Ambulation/Gait assistance: Min guard Ambulation Distance (Feet): 75 Feet Assistive device: Rolling walker (2 wheeled) Gait Pattern/deviations: Step-to pattern;Decreased stance time - right;Decreased step length - right     General Gait Details: 25% VC's for sequence and safety with turns   Financial trader Rankin (Stroke Patients Only)       Balance                                            Cognition Arousal/Alertness: Awake/alert Behavior During Therapy: WFL for tasks assessed/performed Overall Cognitive Status: Within Functional Limits for tasks assessed                                         Exercises      General Comments        Pertinent Vitals/Pain Pain Score: 3  Pain Location: R knee Pain Descriptors / Indicators: Aching;Sharp Pain Intervention(s): Limited activity within patient's tolerance    Home Living Family/patient expects to be discharged to:: Private residence Living Arrangements: Spouse/significant other Available Help at Discharge: Family Type of Home: House Home Access: Stairs to enter Entrance Stairs-Rails: None Home Layout: One level Home Equipment: Environmental consultant - 2 wheels      Prior Function Level of Independence: Independent          PT Goals (current goals can now be found in the care plan section) Acute Rehab PT Goals Patient Stated Goal: to go home Progress towards PT goals: Progressing toward goals    Frequency    7X/week      PT Plan Current plan remains appropriate    Co-evaluation              AM-PAC PT "6 Clicks" Daily Activity  Outcome Measure  Difficulty turning over in bed (including adjusting bedclothes, sheets and blankets)?: A Little Difficulty moving  from lying on back to sitting on the side of the bed? : A Little Difficulty sitting down on and standing up from a chair with arms (e.g., wheelchair, bedside commode, etc,.)?: A Little Help needed moving to and from a bed to chair (including a wheelchair)?: A Little Help needed walking in hospital room?: A Lot Help needed climbing 3-5 steps with a railing? : A Lot 6 Click Score: 16    End of Session Equipment Utilized During Treatment: Right knee immobilizer;Gait belt Activity Tolerance: Patient tolerated treatment well Patient left: in bed;with call bell/phone within reach;with family/visitor present Nurse Communication: Mobility status PT Visit Diagnosis: Difficulty in walking, not elsewhere classified (R26.2);Pain Pain - Right/Left: Right Pain - part of body: Knee     Time: 1342 - 1400 PT Time  Calculation (min) (ACUTE ONLY): 30 min  Charges:      1 gt                  G Codes:       Rica Koyanagi  PTA [facility]  Acute  Rehab Pager      [phone]

## 2017-10-22 NOTE — Plan of Care (Signed)
Plan of care reviewed. Progressing well.

## 2017-10-22 NOTE — Progress Notes (Signed)
Discharge planning, spoke with patient and spouse at bedside. Have chosen [facility]. Contacted [facility] for referral. Has [place] and [facility]. [phone]

## 2017-10-22 NOTE — Plan of Care (Signed)
Progressing as expected

## 2017-10-22 NOTE — Discharge Summary (Signed)
Physician Discharge Summary   Patient ID: ZACCHEAUS STORLIE MRN: [id number] DOB/AGE: [birth year] 71 y.o.  Admit date: 10/21/2017 Discharge date: 10/23/2017  Primary Diagnosis:  Osteoarthritis Right knee(s)   Admission Diagnoses:  Past Medical History:  Diagnosis Date  . Arthritis   . Asthma    childhood  . 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 Diabetes mellitus   . Diabetes mellitus type 2 with neurological manifestations ([place]) 10/04/2014  . Diabetes mellitus without complication ([place])   . Dyspnea   . Erectile dysfunction   . GERD (gastroesophageal reflux disease)   . Headache   . Heart murmur   . History of kidney stones   . Hypercholesteremia   . Hyperlipemia   . Hypertension   . Kidney stone   . Low back pain   . Obesity   . Rotator cuff dysfunction    partial tear , left shoulder  . Sleep apnea    Discharge Diagnoses:   Principal Problem:   OA (osteoarthritis) of knee  Estimated body mass index is 41.14 kg/m as calculated from the following:   Height as of this encounter: _0  (1.803 m).   Weight as of this encounter: 133.8 kg (295 lb).  Procedure:  Procedure(s) (LRB): RIGHT TOTAL KNEE ARTHROPLASTY (Right)   Consults: None  HPI: Joshua Koch is a 71 y.o. year old male with end stage OA of his right knee with progressively worsening pain and dysfunction. He has constant pain, with activity and at rest and significant functional deficits with difficulties even with ADLs. He has had extensive non-op management including analgesics, injections of cortisone, and home exercise program, but remains in significant pain with significant dysfunction. Radiographs show bone on bone arthritis medial and patellofemoral. He presents now for right Total Knee Arthroplasty.     Laboratory Data: Admission on 10/21/2017  Component Date Value Ref Range Status  . Glucose-Capillary 10/21/2017 149* 65 - 99 mg/dL Final  . Comment 1 10/21/2017 Notify RN   Final  .  Glucose-Capillary 10/21/2017 118* 65 - 99 mg/dL Final  . Glucose-Capillary 10/21/2017 197* 65 - 99 mg/dL Final  . WBC 10/22/2017 14.3* 4.0 - 10.5 K/uL Final  . RBC 10/22/2017 3.96* 4.22 - 5.81 MIL/uL Final  . Hemoglobin 10/22/2017 11.9* 13.0 - 17.0 g/dL Final  . HCT 10/22/2017 35.8* 39.0 - 52.0 % Final  . MCV 10/22/2017 90.4  78.0 - 100.0 fL Final  . MCH 10/22/2017 30.1  26.0 - 34.0 pg Final  . MCHC 10/22/2017 33.2  30.0 - 36.0 g/dL Final  . RDW 10/22/2017 13.5  11.5 - 15.5 % Final  . Platelets 10/22/2017 252  150 - 400 K/uL Final  . Sodium 10/22/2017 136  135 - 145 mmol/L Final  . Potassium 10/22/2017 4.3  3.5 - 5.1 mmol/L Final  . Chloride 10/22/2017 108  101 - 111 mmol/L Final  . CO2 10/22/2017 24  22 - 32 mmol/L Final  . Glucose, Bld 10/22/2017 243* 65 - 99 mg/dL Final  . BUN 10/22/2017 20  6 - 20 mg/dL Final  . Creatinine, Ser 10/22/2017 1.06  0.61 - 1.24 mg/dL Final  . Calcium 10/22/2017 8.5* 8.9 - 10.3 mg/dL Final  . GFR calc non Af Amer 10/22/2017 >60  >60 mL/min Final  . GFR calc Af Amer 10/22/2017 >60  >60 mL/min Final   Comment: (NOTE) The eGFR has been calculated using the CKD EPI equation. This calculation has not been validated in all clinical  situations. eGFR's persistently <60 mL/min signify possible Chronic Kidney Disease.   . Anion gap 10/22/2017 4* 5 - 15 Final  . Glucose-Capillary 10/21/2017 261* 65 - 99 mg/dL Final  . Glucose-Capillary 10/22/2017 184* 65 - 99 mg/dL Final  . Glucose-Capillary 10/22/2017 173* 65 - 99 mg/dL Final  . Glucose-Capillary 10/22/2017 235* 65 - 99 mg/dL Final  Hospital Outpatient Visit on 10/15/2017  Component Date Value Ref Range Status  . aPTT 10/15/2017 27  24 - 36 seconds Final  . WBC 10/15/2017 8.1  4.0 - 10.5 K/uL Final  . RBC 10/15/2017 4.61  4.22 - 5.81 MIL/uL Final  . Hemoglobin 10/15/2017 13.8  13.0 - 17.0 g/dL Final  . HCT 10/15/2017 41.3  39.0 - 52.0 % Final  . MCV 10/15/2017 89.6  78.0 - 100.0 fL Final  . MCH 10/15/2017  29.9  26.0 - 34.0 pg Final  . MCHC 10/15/2017 33.4  30.0 - 36.0 g/dL Final  . RDW 10/15/2017 13.3  11.5 - 15.5 % Final  . Platelets 10/15/2017 301  150 - 400 K/uL Final  . Sodium 10/15/2017 140  135 - 145 mmol/L Final  . Potassium 10/15/2017 4.8  3.5 - 5.1 mmol/L Final  . Chloride 10/15/2017 106  101 - 111 mmol/L Final  . CO2 10/15/2017 26  22 - 32 mmol/L Final  . Glucose, Bld 10/15/2017 138* 65 - 99 mg/dL Final  . BUN 10/15/2017 15  6 - 20 mg/dL Final  . Creatinine, Ser 10/15/2017 0.94  0.61 - 1.24 mg/dL Final  . Calcium 10/15/2017 9.2  8.9 - 10.3 mg/dL Final  . Total Protein 10/15/2017 7.1  6.5 - 8.1 g/dL Final  . Albumin 10/15/2017 3.9  3.5 - 5.0 g/dL Final  . AST 10/15/2017 23  15 - 41 U/L Final  . ALT 10/15/2017 26  17 - 63 U/L Final  . Alkaline Phosphatase 10/15/2017 44  38 - 126 U/L Final  . Total Bilirubin 10/15/2017 0.5  0.3 - 1.2 mg/dL Final  . GFR calc non Af Amer 10/15/2017 >60  >60 mL/min Final  . GFR calc Af Amer 10/15/2017 >60  >60 mL/min Final   Comment: (NOTE) The eGFR has been calculated using the CKD EPI equation. This calculation has not been validated in all clinical situations. eGFR's persistently <60 mL/min signify possible Chronic Kidney Disease.   . Anion gap 10/15/2017 8  5 - 15 Final  . Prothrombin Time 10/15/2017 12.9  11.4 - 15.2 seconds Final  . INR 10/15/2017 0.98   Final  . ABO/RH(D) 10/15/2017 A POS   Final  . Antibody Screen 10/15/2017 NEG   Final  . Sample Expiration 10/15/2017 10/24/2017   Final  . Extend sample reason 10/15/2017 NO TRANSFUSIONS OR PREGNANCY IN THE PAST 3 MONTHS   Final  . MRSA, PCR 10/15/2017 NEGATIVE  NEGATIVE Final  . Staphylococcus aureus 10/15/2017 NEGATIVE  NEGATIVE Final   Comment: (NOTE) The Xpert SA Assay (FDA approved for NASAL specimens in patients 1 years of age and older), is one component of a comprehensive surveillance program. It is not intended to diagnose infection nor to guide or monitor treatment.   .  Hgb A1c MFr Bld 10/15/2017 6.7* 4.8 - 5.6 % Final   Comment: (NOTE) Pre diabetes:          5.7%-6.4% Diabetes:              >6.4% Glycemic control for   <7.0% adults with diabetes   . Mean Plasma Glucose 10/15/2017  145.59  mg/dL Final   Performed at [facility], [place] [address], [place], [place] [address]  . Glucose-Capillary 10/15/2017 154* 65 - 99 mg/dL Final  . ABO/RH(D) 10/15/2017 A POS   Final     X-Rays:No results found.  EKG: Orders placed or performed during the hospital encounter of 10/15/17  . EKG 12 lead  . EKG 12 lead     Hospital Course: TODDRICK SANNA is a 71 y.o. who was admitted to [facility]. They were brought to the operating room on 10/21/2017 and underwent Procedure(s): RIGHT TOTAL KNEE ARTHROPLASTY.  Patient tolerated the procedure well and was later transferred to the recovery room and then to the orthopaedic floor for postoperative care.  They were given PO and IV analgesics for pain control following their surgery.  They were given 24 hours of postoperative antibiotics of  Anti-infectives (From admission, onward)   Start     Dose/Rate Route Frequency Ordered Stop   10/21/17 1530  ceFAZolin (ANCEF) IVPB 2g/100 mL premix     2 g 200 mL/hr over 30 Minutes Intravenous Every 6 hours 10/21/17 1453 10/21/17 2214   10/21/17 0600  ceFAZolin (ANCEF) 3 g in dextrose 5 % 50 mL IVPB     3 g 130 mL/hr over 30 Minutes Intravenous On call to O.R. 10/20/17 1420 10/21/17 0959     and started on DVT prophylaxis in the form of Xarelto.   PT and OT were ordered for total joint protocol.  Discharge planning consulted to help with postop disposition and equipment needs.  Patient had a good night on the evening of surgery.  They started to get up OOB with therapy on day one. Hemovac drain was pulled without difficulty.  Continued to work with therapy into day two.  Dressing was changed on day two and the incision was healing well. Patient was seen in rounds and was  ready to go home on POD 2.   Diet: Cardiac diet and Diabetic diet Activity:WBAT Follow-up:in 2 weeks Disposition - Home Discharged Condition: stable   Discharge Instructions    Call MD / Call 911   Complete by:  As directed    If you experience chest pain or shortness of breath, CALL 911 and be transported to the hospital emergency room.  If you develope a fever above 101 F, pus (white drainage) or increased drainage or redness at the wound, or calf pain, call your surgeon's office.   Change dressing   Complete by:  As directed    Change dressing daily with sterile 4 x 4 inch gauze dressing and apply TED hose. Do not submerge the incision under water.   Constipation Prevention   Complete by:  As directed    Drink plenty of fluids.  Prune juice may be helpful.  You may use a stool softener, such as Colace (over the counter) 100 mg twice a day.  Use MiraLax (over the counter) for constipation as needed.   Diet - low sodium heart healthy   Complete by:  As directed    Diet Carb Modified   Complete by:  As directed    Discharge instructions   Complete by:  As directed    Take Xarelto for two and a half more weeks, then discontinue Xarelto. Once the patient has completed the Xarelto, they may resume the 81 mg Aspirin.   Pick up stool softner and laxative for home use following surgery while on pain medications. Do not submerge incision  under water. Please use good hand washing techniques while changing dressing each day. May shower starting three days after surgery. Please use a clean towel to pat the incision dry following showers. Continue to use ice for pain and swelling after surgery. Do not use any lotions or creams on the incision until instructed by your surgeon.  Wear both TED hose on both legs during the day every day for three weeks, but may remove the TED hose at night at home.  Postoperative Constipation Protocol  Constipation - defined medically as fewer than three  stools per week and severe constipation as less than one stool per week.  One of the most common issues patients have following surgery is constipation.  Even if you have a regular bowel pattern at home, your normal regimen is likely to be disrupted due to multiple reasons following surgery.  Combination of anesthesia, postoperative narcotics, change in appetite and fluid intake all can affect your bowels.  In order to avoid complications following surgery, here are some recommendations in order to help you during your recovery period.  Colace (docusate) - Pick up an over-the-counter form of Colace or another stool softener and take twice a day as long as you are requiring postoperative pain medications.  Take with a full glass of water daily.  If you experience loose stools or diarrhea, hold the colace until you stool forms back up.  If your symptoms do not get better within 1 week or if they get worse, check with your doctor.  Dulcolax (bisacodyl) - Pick up over-the-counter and take as directed by the product packaging as needed to assist with the movement of your bowels.  Take with a full glass of water.  Use this product as needed if not relieved by Colace only.   MiraLax (polyethylene glycol) - Pick up over-the-counter to have on hand.  MiraLax is a solution that will increase the amount of water in your bowels to assist with bowel movements.  Take as directed and can mix with a glass of water, juice, soda, coffee, or tea.  Take if you go more than two days without a movement. Do not use MiraLax more than once per day. Call your doctor if you are still constipated or irregular after using this medication for 7 days in a row.  If you continue to have problems with postoperative constipation, please contact the office for further assistance and recommendations.  If you experience "the worst abdominal pain ever" or develop nausea or vomiting, please contact the office immediatly for further  recommendations for treatment.   Do not put a pillow under the knee. Place it under the heel.   Complete by:  As directed    Do not sit on low chairs, stoools or toilet seats, as it may be difficult to get up from low surfaces   Complete by:  As directed    Driving restrictions   Complete by:  As directed    No driving until released by the physician.   Increase activity slowly as tolerated   Complete by:  As directed    Lifting restrictions   Complete by:  As directed    No lifting until released by the physician.   Patient may shower   Complete by:  As directed    You may shower without a dressing once there is no drainage.  Do not wash over the wound.  If drainage remains, do not shower until drainage stops.   TED hose  Complete by:  As directed    Use stockings (TED hose) for 3 weeks on both leg(s).  You may remove them at night for sleeping.   Weight bearing as tolerated   Complete by:  As directed    Laterality:  right   Extremity:  Lower     Allergies as of 10/22/2017   No Known Allergies     Medication List    STOP taking these medications   aspirin EC 81 MG tablet   OSTEO BI-FLEX ADV DOUBLE ST PO   TURMERIC PO     TAKE these medications   cetirizine 10 MG tablet Commonly known as:  ZYRTEC Take 10 mg by mouth daily.   furosemide 20 MG tablet Commonly known as:  LASIX Take 20 mg by mouth daily.   glimepiride 4 MG tablet Commonly known as:  AMARYL Take 4 mg by mouth 2 (two) times daily.   losartan 100 MG tablet Commonly known as:  COZAAR Take 100 mg by mouth every evening.   methocarbamol 500 MG tablet Commonly known as:  ROBAXIN Take 1 tablet (500 mg total) every 6 (six) hours as needed by mouth for muscle spasms.   oxyCODONE 5 MG immediate release tablet Commonly known as:  Oxy IR/ROXICODONE Take 1-2 tablets (5-10 mg total) every 4 (four) hours as needed by mouth for moderate pain or severe pain.   pantoprazole 20 MG tablet Commonly known as:   PROTONIX Take 1 tablet (20 mg total) by mouth daily.   pioglitazone-metformin 15-850 MG tablet Commonly known as:  ACTOPLUS MET Take 1 tablet by mouth 2 (two) times daily.   rivaroxaban 10 MG Tabs tablet Commonly known as:  XARELTO Take 1 tablet (10 mg total) daily with breakfast by mouth. Take Xarelto for two and a half more weeks following discharge from the hospital, then discontinue Xarelto. Once the patient has completed the Xarelto, they may resume the 81 mg Aspirin. Start taking on:  10/23/2017   simvastatin 20 MG tablet Commonly known as:  ZOCOR Take 20 mg by mouth at bedtime.   traMADol 50 MG tablet Commonly known as:  ULTRAM Take 1-2 tablets (50-100 mg total) every 6 (six) hours as needed by mouth (moderate pain refractory to oxycodone 77m).            Durable Medical Equipment  (From admission, onward)        Start     Ordered   10/22/17 1136  For home use only DME Walker rolling  Once    Question:  Patient needs a walker to treat with the following condition  Answer:  S/P knee surgery   10/22/17 1136       Discharge Care Instructions  (From admission, onward)        Start     Ordered   10/22/17 0000  Weight bearing as tolerated    Question Answer Comment  Laterality right   Extremity Lower      10/22/17 2113   10/22/17 0000  Change dressing    Comments:  Change dressing daily with sterile 4 x 4 inch gauze dressing and apply TED hose. Do not submerge the incision under water.   10/22/17 2113     Follow-up Information    [facility]   [place]  [place]  [place] Contact information: [id number]        AGaynelle Arabian MD. Schedule an appointment  as soon as possible for a visit on 11/05/2017.   Specialty:  Orthopedic Surgery Contact information: [address] [place] [address] [phone]           Signed: Arlee Muslim, PA-C Orthopaedic Surgery 10/22/2017, 9:14 PM

## 2017-10-23 LAB — CBC
HEMATOCRIT: 32.5 % — AB (ref 39.0–52.0)
HEMOGLOBIN: 10.9 g/dL — AB (ref 13.0–17.0)
MCH: 30.3 pg (ref 26.0–34.0)
MCHC: 33.5 g/dL (ref 30.0–36.0)
MCV: 90.3 fL (ref 78.0–100.0)
Platelets: 254 10*3/uL (ref 150–400)
RBC: 3.6 MIL/uL — AB (ref 4.22–5.81)
RDW: 13.7 % (ref 11.5–15.5)
WBC: 12.9 10*3/uL — AB (ref 4.0–10.5)

## 2017-10-23 LAB — BASIC METABOLIC PANEL
ANION GAP: 6 (ref 5–15)
BUN: 19 mg/dL (ref 6–20)
CHLORIDE: 106 mmol/L (ref 101–111)
CO2: 25 mmol/L (ref 22–32)
Calcium: 8.5 mg/dL — ABNORMAL LOW (ref 8.9–10.3)
Creatinine, Ser: 0.98 mg/dL (ref 0.61–1.24)
GFR calc non Af Amer: >60 mL/min (ref >60)
GLUCOSE: 157 mg/dL — AB (ref 65–99)
POTASSIUM: 4.2 mmol/L (ref 3.5–5.1)
Sodium: 137 mmol/L (ref 135–145)

## 2017-10-23 LAB — GLUCOSE, CAPILLARY: Glucose-Capillary: 130 mg/dL — ABNORMAL HIGH (ref 65–99)

## 2017-10-23 MED FILL — oxyCODONE HCL 5 MG TABS: 5 | 6 days supply | Qty: 80 | Fill #0

## 2017-10-23 MED FILL — XARELTO 10 MG TABLET: 10 | 19 days supply | Qty: 19 | Fill #0

## 2017-10-23 MED FILL — METHOCARBAMOL 500 MG TABS: 500 | 20 days supply | Qty: 80 | Fill #0

## 2017-10-23 MED FILL — traMADol HCL 50 MG TABS: 50 | 7 days supply | Qty: 56 | Fill #0

## 2017-10-23 NOTE — Progress Notes (Signed)
Physical Therapy Treatment Patient Details Name: Joshua Koch MRN: [id number] DOB: 1946-03-09 Today's Date: 10/23/2017    History of Present Illness  right TKA    PT Comments    POD # 2 am session Spouse present for "hands on" instruction regarding KI, transfers, gait amd stairs.    Follow Up Recommendations  Outpatient PT     Equipment Recommendations  Rolling walker with 5" wheels    Recommendations for Other Services       Precautions / Restrictions Precautions Precautions: Knee Precaution Comments: instructed pt and spouse on KI use and propper application  Required Braces or Orthoses: Knee Immobilizer - Right Knee Immobilizer - Right: Discontinue once straight leg raise with < 10 degree lag Restrictions Weight Bearing Restrictions: No Other Position/Activity Restrictions: WBAT    Mobility  Bed Mobility Overal bed mobility: Needs Assistance Bed Mobility: Supine to Sit     Supine to sit: Min guard     General bed mobility comments: had spouse "hands on" assist pt to EOB with instruction on how to support R LE  Transfers Overall transfer level: Needs assistance Equipment used: Rolling walker (2 wheeled) Transfers: Sit to/from [facility] Sit to Stand: Supervision Stand pivot transfers: Supervision       General transfer comment: 25% VC's with sit to stand for hand placement and safety with turns  Ambulation/Gait Ambulation/Gait assistance: Supervision;Min guard Ambulation Distance (Feet): 85 Feet Assistive device: Rolling walker (2 wheeled) Gait Pattern/deviations: Step-to pattern;Decreased stance time - right;Decreased step length - right Gait velocity: decreased   General Gait Details: 25% VC's for sequence and safety with turns   tolerated well     Stairs Stairs: Yes   Stair Management: No rails Number of Stairs: 3 General stair comments: first practiced up 2 steps (home enterance) backward with walker due to no rails then  practiced up one step (sun room) forward with walker.  Spouse present during for "hands on" instruction.   Wheelchair Mobility    Modified Rankin (Stroke Patients Only)       Balance                                            Cognition Arousal/Alertness: Awake/alert Behavior During Therapy: WFL for tasks assessed/performed Overall Cognitive Status: Within Functional Limits for tasks assessed                                        Exercises      General Comments        Pertinent Vitals/Pain Pain Score: 3  Pain Location: R knee Pain Descriptors / Indicators: Sore Pain Intervention(s): Limited activity within patient's tolerance;Monitored during session;Repositioned;Ice applied    Home Living                      Prior Function            PT Goals (current goals can now be found in the care plan section) Progress towards PT goals: Progressing toward goals    Frequency    7X/week      PT Plan Current plan remains appropriate    Co-evaluation              AM-PAC PT "6 Clicks" Daily Activity  Outcome Measure  Difficulty turning over in bed (including adjusting bedclothes, sheets and blankets)?: A Little Difficulty moving from lying on back to sitting on the side of the bed? : A Little Difficulty sitting down on and standing up from a chair with arms (e.g., wheelchair, bedside commode, etc,.)?: A Little Help needed moving to and from a bed to chair (including a wheelchair)?: A Little Help needed walking in hospital room?: A Lot Help needed climbing 3-5 steps with a railing? : A Lot 6 Click Score: 16    End of Session Equipment Utilized During Treatment: Right knee immobilizer;Gait belt Activity Tolerance: Patient tolerated treatment well Patient left: in chair;with call bell/phone within reach;with family/visitor present Nurse Communication: (pt ready for D/C to home one PT session today) PT Visit  Diagnosis: Difficulty in walking, not elsewhere classified (R26.2);Pain Pain - Right/Left: Right Pain - part of body: Knee     Time: 0917-0958 PT Time Calculation (min) (ACUTE ONLY): 41 min  Charges:  $Gait Training: 8-22 mins $Therapeutic Exercise: 8-22 mins $Therapeutic Activity: 8-22 mins                    G Codes:       Rica Koyanagi  PTA [facility]  Acute  Rehab Pager      [phone]

## 2017-10-23 NOTE — Progress Notes (Signed)
Occupational Therapy Treatment Patient Details Name: MINAS BONSER MRN: [id number] DOB: 06-06-1946 Today's Date: 10/23/2017    History of present illness  right TKA   OT comments  Wife will A home with ADL activity until she returns to work  Follow Up Recommendations  No OT follow up    Equipment Recommendations  None recommended by OT       Precautions / Restrictions Precautions Precautions: Knee Required Braces or Orthoses: Knee Immobilizer - Right Knee Immobilizer - Right: Discontinue once straight leg raise with < 10 degree lag Restrictions Weight Bearing Restrictions: No       Mobility Bed Mobility Overal bed mobility: Needs Assistance Bed Mobility: Sit to Supine     Supine to sit: Supervision     General bed mobility comments: support with right LE   Transfers Overall transfer level: Needs assistance Equipment used: Rolling walker (2 wheeled) Transfers: Sit to/from [facility] Sit to Stand: Supervision Stand pivot transfers: Supervision       General transfer comment: 25% VC's with sit to stand for hand placement     Balance                                           ADL either performed or assessed with clinical judgement   ADL Overall ADL's : Needs assistance/impaired Eating/Feeding: Set up   Grooming: Set up;Sitting   Upper Body Bathing: Set up;Sitting   Lower Body Bathing: Minimal assistance;Sit to/from stand   Upper Body Dressing : Set up;Sitting   Lower Body Dressing: Minimal assistance;Sit to/from stand Lower Body Dressing Details (indicate cue type and reason): Pt has AE.  discussed using at home to increase I with ADL activity  Toilet Transfer: Set up;RW;Comfort height toilet   Toileting- Clothing Manipulation and Hygiene: Supervision/safety;Sit to/from stand;Cueing for safety;Cueing for sequencing   Tub/ Shower Transfer: Walk-in shower;Min guard                     Cognition  Arousal/Alertness: Awake/alert Behavior During Therapy: WFL for tasks assessed/performed Overall Cognitive Status: Within Functional Limits for tasks assessed                                                     Pertinent Vitals/ Pain       Pain Score: 3  Pain Location: R knee Pain Descriptors / Indicators: Sore Pain Intervention(s): Limited activity within patient's tolerance;Monitored during session;Repositioned;Ice applied     Prior Functioning/Environment              Frequency  Min 2X/week        Progress Toward Goals  OT Goals(current goals can now be found in the care plan section)  Progress towards OT goals: Progressing toward goals     Plan Discharge plan remains appropriate    Co-evaluation                 AM-PAC PT "6 Clicks" Daily Activity     Outcome Measure   Help from another person eating meals?: None Help from another person taking care of personal grooming?: None Help from another person toileting, which includes using toliet, bedpan, or urinal?: A Little Help from another person bathing (including  washing, rinsing, drying)?: A Little Help from another person to put on and taking off regular upper body clothing?: None Help from another person to put on and taking off regular lower body clothing?: A Little 6 Click Score: 21    End of Session Equipment Utilized During Treatment: Rolling walker  OT Visit Diagnosis: Unsteadiness on feet (R26.81)   Activity Tolerance Patient tolerated treatment well   Patient Left in bed   Nurse Communication Mobility status        Time: 2542-7062 OT Time Calculation (min): 25 min  Charges: OT General Charges $OT Visit: 1 Visit OT Treatments $Self Care/Home Management : 23-37 mins  [place], [place]   Payton Mccallum D 10/23/2017, 12:09 PM

## 2017-10-23 NOTE — Progress Notes (Signed)
   Subjective: 2 Days Post-Op Procedure(s) (LRB): RIGHT TOTAL KNEE ARTHROPLASTY (Right) Patient reports pain as mild.   Patient seen in rounds by Dr. Wynelle Link. Patient is well, and has had no acute complaints or problems Patient is ready to go home  Objective: Vital signs in last 24 hours: Temp:  [98 F (36.7 C)-98.9 F (37.2 C)] 98.8 F (37.1 C) (11/14 0554) Pulse Rate:  [64-78] 70 (11/14 0554) Resp:  [18] 18 (11/14 0554) BP: (127-151)/(45-57) 141/48 (11/14 0554) SpO2:  [97 %-100 %] 100 % (11/14 0554)  Intake/Output from previous day:  Intake/Output Summary (Last 24 hours) at 10/23/2017 0721 Last data filed at 10/23/2017 0520 Gross per 24 hour  Intake 1260 ml  Output 2150 ml  Net -890 ml    Intake/Output this shift: No intake/output data recorded.  Labs: Recent Labs    10/22/17 0532 10/23/17 0621  HGB 11.9* 10.9*   Recent Labs    10/22/17 0532 10/23/17 0621  WBC 14.3* 12.9*  RBC 3.96* 3.60*  HCT 35.8* 32.5*  PLT 252 254   Recent Labs    10/22/17 0532 10/23/17 0621  NA 136 137  K 4.3 4.2  CL 108 106  CO2 24 25  BUN 20 19  CREATININE 1.06 0.98  GLUCOSE 243* 157*  CALCIUM 8.5* 8.5*   No results for input(s): LABPT, INR in the last 72 hours.  EXAM: General - Patient is Alert, Appropriate and Oriented Extremity - Neurovascular intact Sensation intact distally Intact pulses distally Dorsiflexion/Plantar flexion intact Incision - clean, dry Motor Function - intact, moving foot and toes well on exam.   Assessment/Plan: 2 Days Post-Op Procedure(s) (LRB): RIGHT TOTAL KNEE ARTHROPLASTY (Right) Procedure(s) (LRB): RIGHT TOTAL KNEE ARTHROPLASTY (Right) Past Medical History:  Diagnosis Date  . Arthritis   . Asthma    childhood  . 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 Diabetes mellitus   . Diabetes mellitus type 2 with neurological manifestations ([place]) 10/04/2014  . Diabetes mellitus without complication ([place])   . Dyspnea   . Erectile  dysfunction   . GERD (gastroesophageal reflux disease)   . Headache   . Heart murmur   . History of kidney stones   . Hypercholesteremia   . Hyperlipemia   . Hypertension   . Kidney stone   . Low back pain   . Obesity   . Rotator cuff dysfunction    partial tear , left shoulder  . Sleep apnea    Principal Problem:   OA (osteoarthritis) of knee  Estimated body mass index is 41.14 kg/m as calculated from the following:   Height as of this encounter: 5\' 11"  (1.803 m).   Weight as of this encounter: 133.8 kg (295 lb). Up with therapy Discharge home with home health Diet - Cardiac diet Follow up - in 2 weeks Activity - WBAT Disposition - Home Condition Upon Discharge - Good D/C Meds - See DC Summary DVT Prophylaxis - Xarelto  Arlee Muslim, PA-C Orthopaedic Surgery 10/23/2017, 7:21 AM

## 2018-09-02 ENCOUNTER — Ambulatory Visit: Payer: 59 | Admitting: Internal Medicine

## 2018-09-02 VITALS — BP 166/84 | HR 84 | Temp 97.8°F | Resp 18 | Ht 71.0 in | Wt 299.6 lb

## 2018-09-02 DIAGNOSIS — I700 Atherosclerosis of aorta: Secondary | ICD-10-CM

## 2018-09-02 DIAGNOSIS — E119 Type 2 diabetes mellitus without complications: Secondary | ICD-10-CM

## 2018-09-02 DIAGNOSIS — N182 Chronic kidney disease, stage 2 (mild): Secondary | ICD-10-CM

## 2018-09-02 DIAGNOSIS — E782 Mixed hyperlipidemia: Secondary | ICD-10-CM

## 2018-09-02 DIAGNOSIS — Z79899 Other long term (current) drug therapy: Secondary | ICD-10-CM

## 2018-09-02 DIAGNOSIS — I10 Essential (primary) hypertension: Secondary | ICD-10-CM

## 2018-09-02 DIAGNOSIS — E559 Vitamin D deficiency, unspecified: Secondary | ICD-10-CM

## 2018-09-02 DIAGNOSIS — G4733 Obstructive sleep apnea (adult) (pediatric): Secondary | ICD-10-CM

## 2018-09-02 DIAGNOSIS — Z9989 Dependence on other enabling machines and devices: Secondary | ICD-10-CM

## 2018-09-02 DIAGNOSIS — E1122 Type 2 diabetes mellitus with diabetic chronic kidney disease: Secondary | ICD-10-CM

## 2018-09-02 DIAGNOSIS — Z6841 Body Mass Index (BMI) 40.0 and over, adult: Secondary | ICD-10-CM

## 2018-09-02 MED ORDER — METFORMIN HCL ER 500 MG PO TB24: ORAL_TABLET | ORAL | 1 refills | Status: DC

## 2018-09-02 MED ORDER — PHENTERMINE HCL 37.5 MG PO TABS: ORAL_TABLET | ORAL | 2 refills | Status: DC

## 2018-09-02 NOTE — Patient Instructions (Signed)

## 2018-09-02 NOTE — Progress Notes (Signed)
This very nice 72 y.o. MWM  presents to establish care with HTN, HLD, T2_NIDDM, OSA  and Vitamin D Deficiency. Patient was last Hospitalized in Oct 218 for a Rt TKR by Dr Wynelle Link.      Patient is treated for HTN circa 1995  & BP has been controlled at home. Today's BP is not at goal at 166/84.  Patient has had no complaints of any cardiac type chest pain, palpitations, dyspnea / orthopnea / PND, dizziness, claudication, or dependent edema.     Hyperlipidemia is allegedly controlled with diet & meds. He alleges Lipids have been at goal at his previous provider's office.     Also, the patient has history of  Long hx/o Morbid Obesity (BMI 41+) and T2_NIDDM also [place] and has had no symptoms of reactive hypoglycemia, diabetic polys, paresthesias or visual blurring. He has been on a weight gaining Acto/Met combo for his Diabetes.  He reports A1c's usu are in the 6% range. Last A1c in the hospital was not at goal: Lab Results  Component Value Date   HGBA1C 6.7 (H) 10/15/2017      Further, the patient is expectant to have Vitamin D Deficiency as he does not supplement.  Current Outpatient Medications on File Prior to Visit  Medication Sig  . aspirin (ECOTRIN LOW STRENGTH) 81 MG EC tablet Take 81 mg by mouth daily. Swallow whole.  . cetirizine (ZYRTEC) 10 MG tablet Take 10 mg by mouth daily.  . furosemide (LASIX) 20 MG tablet Take 20 mg by mouth daily.  Marland Kitchen glimepiride (AMARYL) 4 MG tablet Take 4 mg by mouth 2 (two) times daily.  Marland Kitchen losartan (COZAAR) 100 MG tablet Take 100 mg by mouth every evening.  . simvastatin (ZOCOR) 20 MG tablet Take 20 mg by mouth at bedtime.    No current facility-administered medications on file prior to visit.    No Known Allergies   PMHx:   Past Medical History:  Diagnosis Date  . Arthritis   . Asthma    childhood  . 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 Diabetes mellitus   . Diabetes mellitus type 2 with neurological manifestations ([place])  10/04/2014  . Diabetes mellitus without complication ([place])   . Dyspnea   . Erectile dysfunction   . GERD (gastroesophageal reflux disease)   . Headache   . Heart murmur   . History of kidney stones   . Hypercholesteremia   . Hyperlipemia   . Hypertension   . Kidney stone   . Low back pain   . Obesity   . Rotator cuff dysfunction    partial tear , left shoulder  . Sleep apnea    Immunization History  Administered Date(s) Administered  . Tdap 05/10/2014   Past Surgical History:  Procedure Laterality Date  . COLONOSCOPY    . KNEE ARTHROSCOPY    . PILONIDAL CYST EXCISION     from base of spine  . 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   Procedure: RIGHT TOTAL KNEE ARTHROPLASTY;  Surgeon: Gaynelle Arabian, MD;  Location: [facility];  Service: Orthopedics;  Laterality: Right;   FHx:    Reviewed records  SHx:    Reviewed records  Systems Review:  Constitutional: Denies fever, chills, wt changes, headaches, insomnia, fatigue, night sweats, change in appetite. Eyes: Denies redness, blurred vision, diplopia, discharge, itchy, watery eyes.  ENT: Denies discharge, congestion, post nasal drip, epistaxis, sore throat, earache, hearing loss, dental pain,  tinnitus, vertigo, sinus pain, snoring.  CV: Denies chest pain, palpitations, irregular heartbeat, syncope, dyspnea, diaphoresis, orthopnea, PND, claudication or edema. Respiratory: denies cough, dyspnea, DOE, pleurisy, hoarseness, laryngitis, wheezing.  Gastrointestinal: Denies dysphagia, odynophagia, heartburn, reflux, water brash, abdominal pain or cramps, nausea, vomiting, bloating, diarrhea, constipation, hematemesis, melena, hematochezia  or hemorrhoids. Genitourinary: Denies dysuria, frequency, urgency, nocturia, hesitancy, discharge, hematuria or flank pain. Musculoskeletal: Denies arthralgias, myalgias, stiffness, jt. swelling, pain, limping or strain/sprain.  Skin: Denies pruritus, rash, hives, warts, acne, eczema or change in  skin lesion(s). Neuro: No weakness, tremor, incoordination, spasms, paresthesia or pain. Psychiatric: Denies confusion, memory loss or sensory loss. Endo: Denies change in weight, skin or hair change.  Heme/Lymph: No excessive bleeding, bruising or enlarged lymph nodes.  Physical Exam  BP (!) 166/84   Pulse 84   Temp 97.8 F (36.6 C)   Resp 18   Ht 5' 11"  (1.803 m)   Wt 299 lb 9.6 oz (135.9 kg)   BMI 41.79 kg/m   Appears  overnourished, well groomed  and in no distress.  Eyes: PERRLA, EOMs, conjunctiva no swelling or erythema. Sinuses: No frontal/maxillary tenderness ENT/Mouth: EAC's clear, TM's nl w/o erythema, bulging. Nares clear w/o erythema, swelling, exudates. Oropharynx clear without erythema or exudates. Oral hygiene is good. Tongue normal, non obstructing. Hearing intact.  Neck: Supple. Thyroid not palpable. Car 2+/2+ without bruits, nodes or JVD. Chest: Respirations nl with BS clear & equal w/o rales, rhonchi, wheezing or stridor.  Cor: Heart sounds normal w/ regular rate and rhythm without sig. murmurs, gallops, clicks or rubs. Peripheral pulses normal and equal  without edema.  Abdomen: Soft & bowel sounds normal. Non-tender w/o guarding, rebound, hernias, masses or organomegaly.  Lymphatics: Unremarkable.  Musculoskeletal: Full ROM all peripheral extremities, joint stability, 5/5 strength and normal gait.  Skin: Warm, dry without exposed rashes, lesions or ecchymosis apparent.  Neuro: Cranial nerves intact, reflexes equal bilaterally. Sensory-motor testing grossly intact. Tendon reflexes grossly intact.  Pysch: Alert & oriented x 3.  Insight and judgement nl & appropriate. No ideations.  Assessment and Plan:  1. Essential hypertension  - Continue medication, monitor blood pressure at home.  - Continue DASH diet.  Reminder to go to the ER if any CP,  SOB, nausea, dizziness, severe HA, changes vision/speech.  - CBC with Differential/Platelet - COMPLETE METABOLIC  PANEL WITH GFR - Magnesium  2. Hyperlipidemia, mixed  - Continue diet/meds, exercise,& lifestyle modifications.  - Continue monitor periodic cholesterol/liver & renal functions   - Lipid panel  3. Type 2 diabetes mellitus without complication, without long-term current use of insulin (HCC)  - Continue diet, exercise, lifestyle modifications.  - Monitor appropriate labs.  - Hemoglobin A1c - Insulin, random - D/C ActosPlusMet - metFORMIN (GLUCOPHAGE-XR) 500 MG 24 hr tablet; Take 2 tablets 2 x/ day for Diabetes  Dispense: 360 tablet; Refill: 1  4. Vitamin D deficiency  - Continue supplementation.  - VITAMIN D 25 Hydroxyl  5. OSA on CPAP   6. Medication management  - CBC with Differential/Platelet - COMPLETE METABOLIC PANEL WITH GFR - Magnesium - Lipid panel - Hemoglobin A1c - Insulin, random - VITAMIN D 25 Hydroxyl  7. Class 3 severe obesity due to excess calories with serious comorbidity and body mass index (BMI) of 40.0 to 44.9 in adult (HCC)  - phentermine (ADIPEX-P) 37.5 MG tablet; Take 1/2 to 1 tablet every morning for dieting & weight loss  Dispense: 30 tablet; Refill: 2       Discussed  regular exercise, BP monitoring, weight control to achieve/maintain BMI less than 25 and discussed med and SE's. Recommended labs to assess and monitor clinical status with further disposition pending results of labs. Over 30 minutes of exam, counseling, chart review was performed.

## 2018-09-03 LAB — VITAMIN D 25 HYDROXY (VIT D DEFICIENCY, FRACTURES): Vit D, 25-Hydroxy: 29 ng/mL — ABNORMAL LOW (ref 30–100)

## 2018-09-03 LAB — COMPLETE METABOLIC PANEL WITH GFR
AG RATIO: 1.8 (calc) (ref 1.0–2.5)
ALT: 24 U/L (ref 9–46)
AST: 22 U/L (ref 10–35)
Albumin: 4.2 g/dL (ref 3.6–5.1)
Alkaline phosphatase (APISO): 38 U/L — ABNORMAL LOW (ref 40–115)
BILIRUBIN TOTAL: 0.4 mg/dL (ref 0.2–1.2)
BUN: 16 mg/dL (ref 7–25)
CO2: 25 mmol/L (ref 20–32)
Calcium: 9.3 mg/dL (ref 8.6–10.3)
Chloride: 103 mmol/L (ref 98–110)
Creat: 0.9 mg/dL (ref 0.70–1.18)
GFR, EST AFRICAN AMERICAN: 100 mL/min/{1.73_m2} (ref >=60)
GFR, EST NON AFRICAN AMERICAN: 86 mL/min/{1.73_m2} (ref >=60)
GLOBULIN: 2.4 g/dL (ref 1.9–3.7)
Glucose, Bld: 173 mg/dL — ABNORMAL HIGH (ref 65–99)
Potassium: 4.4 mmol/L (ref 3.5–5.3)
SODIUM: 136 mmol/L (ref 135–146)
Total Protein: 6.6 g/dL (ref 6.1–8.1)

## 2018-09-03 LAB — CBC WITH DIFFERENTIAL/PLATELET
Basophils Absolute: 39 cells/uL (ref 0–200)
Basophils Relative: 0.6 %
EOS PCT: 1.8 %
Eosinophils Absolute: 117 cells/uL (ref 15–500)
HEMATOCRIT: 40.5 % (ref 38.5–50.0)
HEMOGLOBIN: 13.9 g/dL (ref 13.2–17.1)
LYMPHS ABS: 2061 {cells}/uL (ref 850–3900)
MCH: 30.3 pg (ref 27.0–33.0)
MCHC: 34.3 g/dL (ref 32.0–36.0)
MCV: 88.4 fL (ref 80.0–100.0)
MONOS PCT: 9.3 %
MPV: 10.3 fL (ref 7.5–12.5)
NEUTROS ABS: 3679 {cells}/uL (ref 1500–7800)
Neutrophils Relative %: 56.6 %
Platelets: 271 10*3/uL (ref 140–400)
RBC: 4.58 10*6/uL (ref 4.20–5.80)
RDW: 12.6 % (ref 11.0–15.0)
Total Lymphocyte: 31.7 %
WBC mixed population: 605 cells/uL (ref 200–950)
WBC: 6.5 10*3/uL (ref 3.8–10.8)

## 2018-09-03 LAB — LIPID PANEL
CHOL/HDL RATIO: 4.6 (calc) (ref <5.0)
Cholesterol: 152 mg/dL (ref <200)
HDL: 33 mg/dL — ABNORMAL LOW (ref >40)
LDL Cholesterol (Calc): 88 mg/dL (calc)
NON-HDL CHOLESTEROL (CALC): 119 mg/dL (ref <130)
Triglycerides: 213 mg/dL — ABNORMAL HIGH (ref <150)

## 2018-09-03 LAB — MAGNESIUM: Magnesium: 1.8 mg/dL (ref 1.5–2.5)

## 2018-09-03 LAB — INSULIN, RANDOM: INSULIN: 24 u[IU]/mL — AB (ref 2.0–19.6)

## 2018-09-03 LAB — HEMOGLOBIN A1C
EAG (MMOL/L): 7.9 (calc)
HEMOGLOBIN A1C: 6.6 %{Hb} — AB (ref <5.7)
MEAN PLASMA GLUCOSE: 143 (calc)

## 2018-09-06 ENCOUNTER — Encounter: Payer: Self-pay | Admitting: Internal Medicine

## 2018-09-06 DIAGNOSIS — E559 Vitamin D deficiency, unspecified: Secondary | ICD-10-CM | POA: Insufficient documentation

## 2018-09-06 DIAGNOSIS — I700 Atherosclerosis of aorta: Secondary | ICD-10-CM | POA: Insufficient documentation

## 2018-09-06 DIAGNOSIS — Z6841 Body Mass Index (BMI) 40.0 and over, adult: Secondary | ICD-10-CM

## 2018-09-06 MED ORDER — ASPIRIN 81 MG PO TBEC: DELAYED_RELEASE_TABLET | ORAL | Status: AC

## 2018-10-01 ENCOUNTER — Ambulatory Visit: Payer: Self-pay | Admitting: Adult Health

## 2018-10-02 ENCOUNTER — Ambulatory Visit: Payer: 59 | Admitting: Adult Health

## 2018-10-02 ENCOUNTER — Encounter: Payer: Self-pay | Admitting: Adult Health

## 2018-10-02 VITALS — BP 126/70 | HR 61 | Temp 97.0°F | Ht 71.0 in | Wt 287.0 lb

## 2018-10-02 DIAGNOSIS — R10814 Left lower quadrant abdominal tenderness: Secondary | ICD-10-CM | POA: Diagnosis not present

## 2018-10-02 DIAGNOSIS — K5790 Diverticulosis of intestine, part unspecified, without perforation or abscess without bleeding: Secondary | ICD-10-CM

## 2018-10-02 MED ORDER — METRONIDAZOLE 500 MG PO TABS: 500.0000 mg | ORAL_TABLET | Freq: Three times a day (TID) | ORAL | 0 refills | Status: DC

## 2018-10-02 MED ORDER — CIPROFLOXACIN HCL 500 MG PO TABS: 500.0000 mg | ORAL_TABLET | Freq: Two times a day (BID) | ORAL | 0 refills | Status: AC

## 2018-10-02 NOTE — Patient Instructions (Addendum)
Diverticulitis Diverticulitis is when small pockets in your large intestine (colon) get infected or swollen. This causes stomach pain and watery poop (diarrhea). These pouches are called diverticula. They form in people who have a condition called diverticulosis. Follow these instructions at home: Medicines  Take over-the-counter and prescription medicines only as told by your doctor. These include: ? Antibiotics. ? Pain medicines. ? Fiber pills. ? Probiotics. ? Stool softeners.  Do not drive or use heavy machinery while taking prescription pain medicine.  If you were prescribed an antibiotic, take it as told. Do not stop taking it even if you feel better. General instructions  Follow a diet as told by your doctor.  When you feel better, your doctor may tell you to change your diet. You may need to eat a lot of fiber. Fiber makes it easier to poop (have bowel movements). Healthy foods with fiber include: ? Berries. ? Beans. ? Lentils. ? Green vegetables.  Exercise 3 or more times a week. Aim for 30 minutes each time. Exercise enough to sweat and make your heart beat faster.  Keep all follow-up visits as told. This is important. You may need to have an exam of the large intestine. This is called a colonoscopy. Contact a doctor if:  Your pain does not get better.  You have a hard time eating or drinking.  You are not pooping like normal. Get help right away if:  Your pain gets worse.  Your problems do not get better.  Your problems get worse very fast.  You have a fever.  You throw up (vomit) more than one time.  You have poop that is: ? Bloody. ? Black. ? Tarry. Summary  Diverticulitis is when small pockets in your large intestine (colon) get infected or swollen.  Take medicines only as told by your doctor.  Follow a diet as told by your doctor. This information is not intended to replace advice given to you by your health care provider. Make sure you discuss  any questions you have with your health care provider. Document Released: 05/14/2008 Document Revised: 12/13/2016 Document Reviewed: 12/13/2016 Elsevier Interactive Patient Education  2017 [employer]     Ventral Hernia A ventral hernia is a bulge of tissue from inside the abdomen that pushes through a weak area of the muscles that form the front wall of the abdomen. The tissues inside the abdomen are inside a sac (peritoneum). These tissues include the small intestine, large intestine, and the fatty tissue that covers the intestines (omentum). Sometimes, the bulge that forms a hernia contains intestines. Other hernias contain only fat. Ventral hernias do not go away without surgical treatment. There are several types of ventral hernias. You may have:  A hernia at an incision site from previous abdominal surgery (incisional hernia).  A hernia just above the belly button (epigastric hernia), or at the belly button (umbilical hernia). These types of hernias can develop from heavy lifting or straining.  A hernia that comes and goes (reducible hernia). It may be visible only when you lift or strain. This type of hernia can be pushed back into the abdomen (reduced).  A hernia that traps abdominal tissue inside the hernia (incarcerated hernia). This type of hernia does not reduce.  A hernia that cuts off blood flow to the tissues inside the hernia (strangulated hernia). The tissues can start to die if this happens. This is a very painful bulge that cannot be reduced. A strangulated hernia is a medical emergency.  What  are the causes? This condition is caused by abdominal tissue putting pressure on an area of weakness in the abdominal muscles. What increases the risk? The following factors may make you more likely to develop this condition:  Being male.  Being 68 or older.  Being overweight or obese.  Having had previous abdominal surgery, especially if there was an infection after  surgery.  Having had an injury to the abdominal wall.  Having had several pregnancies.  Having a buildup of fluid inside the abdomen (ascites).  What are the signs or symptoms? The only symptom of a ventral hernia may be a painless bulge in the abdomen. A reducible hernia may be visible only when you strain, cough, or lift. Other symptoms may include:  Dull pain.  A feeling of pressure.  Signs and symptoms of a strangulated hernia may include:  Increasing pain.  Nausea and vomiting.  Pain when pressing on the hernia.  The skin over the hernia turning red or purple.  Constipation.  Blood in the stool (feces).  How is this diagnosed? This condition may be diagnosed based on:  Your symptoms.  Your medical history.  A physical exam. You may be asked to cough or strain while standing. These actions increase the pressure inside your abdomen and force the hernia through the opening in your muscles. Your health care provider may try to reduce the hernia by pressing on it.  Imaging studies, such as an ultrasound or CT scan.  How is this treated? This condition is treated with surgery. If you have a strangulated hernia, surgery is done as soon as possible. If your hernia is small and not incarcerated, you may be asked to lose some weight before surgery. Follow these instructions at home:  Follow instructions from your health care provider about eating or drinking restrictions.  If you are overweight, your health care provider may recommend that you increase your activity level and eat a healthier diet.  Do not lift anything that is heavier than 10 lb (4.5 kg).  Return to your normal activities as told by your health care provider. Ask your health care provider what activities are safe for you. You may need to avoid activities that increase pressure on your hernia.  Take over-the-counter and prescription medicines only as told by your health care provider.  Keep all  follow-up visits as told by your health care provider. This is important. Contact a health care provider if:  Your hernia gets larger.  Your hernia becomes painful. Get help right away if:  Your hernia becomes increasingly painful.  You have pain along with any of the following: ? Changes in skin color in the area of the hernia. ? Nausea. ? Vomiting. ? Fever. Summary  A ventral hernia is a bulge of tissue from inside the abdomen that pushes through a weak area of the muscles that form the front wall of the abdomen.  This condition is treated with surgery, which may be urgent depending on your hernia.  Do not lift anything that is heavier than 10 lb (4.5 kg), and follow activity instructions from your health care provider. This information is not intended to replace advice given to you by your health care provider. Make sure you discuss any questions you have with your health care provider. Document Released: 11/12/2012 Document Revised: 07/13/2016 Document Reviewed: 07/13/2016 Elsevier Interactive Patient Education  [employer].

## 2018-10-02 NOTE — Progress Notes (Signed)
Assessment and Plan:  Joshua Koch was seen today for abdominal pain.  Diagnoses and all orders for this visit:  Left lower quadrant abdominal tenderness without rebound tenderness Mild, actively resolving without accompanying symptoms. Suspect mild case of diverticulitis. After discussion with patient, will defer lab workup or imaging in light of resolving symptoms, discussed should he experience recurred pain he should contact the office, at that time will proceed with imaging, and to start dual antibiotic therapy.  Diverticulosis/diverticulitis discussed, information provided on AVS, advised to present to ED for any sudden/severe symptoms.   Other orders -     ciprofloxacin (CIPRO) 500 MG tablet; Take 1 tablet (500 mg total) by mouth 2 (two) times daily for 7 days. -     metroNIDAZOLE (FLAGYL) 500 MG tablet; Take 1 tablet (500 mg total) by mouth 3 (three) times daily.  Further disposition pending results of labs. Discussed med's effects and SE's.   Over 15 minutes of exam, counseling, chart review, and critical decision making was performed.   Future Appointments  Date Time Provider [place]  10/08/2018  2:30 PM Unk Pinto, MD [id number] None    ------------------------------------------------------------------------------------------------------------------   HPI BP 126/70   Pulse 61   Temp (!) 97 F (36.1 C)   Ht 5\' 11"  (1.803 m)   Wt 287 lb (130.2 kg)   SpO2 97%   BMI 40.03 kg/m   72 y.o.male presents for evaluation of intermittent LLQ pain that he had briefly 2 weeks ago then resolved; he reports he woke up again with LLQ abdominal pain on Tuesday which became severe yesterday. He reports pain is gradual in onset, pain is sharp when aggravated with baseline discomfort. He reports pain was at its worst a 7/10 when moving/standing, now is resolved. He denies symptosm other than pain during this time.   He denies fever/chills, nausea, vomiting, diarrhea,   He  endorses mild constipation 2 weeks ago that quickly resolved with stool softener.   He has had colonoscopy, discussed at recent visit with Dr. Melford Aase, pending receipt of report. He reports this was unremarkable. CT abd from 02/22/2012 shows diverticulosis. He does have history of renal calculi, denies back pain, urinary changes.    Past Medical History:  Diagnosis Date  . Arthritis   . Asthma    childhood  . 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 Diabetes mellitus   . Diabetes mellitus type 2 with neurological manifestations ([place]) 10/04/2014  . Diabetes mellitus without complication ([place])   . Dyspnea   . Erectile dysfunction   . GERD (gastroesophageal reflux disease)   . Headache   . Heart murmur   . History of kidney stones   . Hypercholesteremia   . Hyperlipemia   . Hypertension   . Kidney stone   . Low back pain   . Obesity   . Rotator cuff dysfunction    partial tear , left shoulder  . Sleep apnea      No Known Allergies  Current Outpatient Medications on File Prior to Visit  Medication Sig  . aspirin (ECOTRIN LOW STRENGTH) 81 MG EC tablet Take 1 tablet daily  . cetirizine (ZYRTEC) 10 MG tablet Take 10 mg by mouth daily.  . furosemide (LASIX) 20 MG tablet Take 20 mg by mouth daily.  Marland Kitchen glimepiride (AMARYL) 4 MG tablet Take 4 mg by mouth 2 (two) times daily.  Marland Kitchen losartan (COZAAR) 100 MG tablet Take 100 mg by mouth every evening.  . metFORMIN (  GLUCOPHAGE-XR) 500 MG 24 hr tablet Take 2 tablets 2 x/ day for Diabetes  . phentermine (ADIPEX-P) 37.5 MG tablet Take 1/2 to 1 tablet every morning for dieting & weight loss  . simvastatin (ZOCOR) 20 MG tablet Take 20 mg by mouth at bedtime.    No current facility-administered medications on file prior to visit.     ROS: all negative except above.   Physical Exam:  BP 126/70   Pulse 61   Temp (!) 97 F (36.1 C)   Ht 5\' 11"  (1.803 m)   Wt 287 lb (130.2 kg)   SpO2 97%   BMI 40.03 kg/m   General Appearance: Well  nourished, in no apparent distress. Eyes: PERRLA, conjunctiva no swelling or erythema ENT/Mouth: Hearing normal.  Neck: Supple Respiratory: Respiratory effort normal, BS equal bilaterally without rales, rhonchi, wheezing or stridor.  Cardio: RRR with no MRGs. Brisk peripheral pulses without edema.  Abdomen: Soft, + BS.  Mildly tender to LLQ, no guarding, rebound, masses. He does have large ventral hernia that is non-tender. No palpable inguinal hernias.  Lymphatics: Non tender without lymphadenopathy.  Musculoskeletal: normal gait.  Skin: Warm, dry without rashes, lesions, ecchymosis.  Neuro: Cranial nerves intact. Normal muscle tone, sensation intact.  Psych: Awake and oriented X 3, normal affect, Insight and Judgment appropriate.     Izora Ribas, NP 9:09 AM [name]

## 2018-10-08 ENCOUNTER — Ambulatory Visit: Payer: 59 | Admitting: Internal Medicine

## 2018-10-08 ENCOUNTER — Encounter: Payer: Self-pay | Admitting: Internal Medicine

## 2018-10-08 VITALS — BP 144/68 | HR 92 | Temp 97.8°F | Resp 18 | Ht 71.0 in | Wt 287.2 lb

## 2018-10-08 DIAGNOSIS — I10 Essential (primary) hypertension: Secondary | ICD-10-CM | POA: Diagnosis not present

## 2018-10-08 DIAGNOSIS — E782 Mixed hyperlipidemia: Secondary | ICD-10-CM | POA: Diagnosis not present

## 2018-10-08 DIAGNOSIS — Z6841 Body Mass Index (BMI) 40.0 and over, adult: Secondary | ICD-10-CM

## 2018-10-08 DIAGNOSIS — E119 Type 2 diabetes mellitus without complications: Secondary | ICD-10-CM | POA: Diagnosis not present

## 2018-10-08 MED ORDER — TOPIRAMATE 50 MG PO TABS: ORAL_TABLET | ORAL | 2 refills | Status: DC

## 2018-10-08 NOTE — Patient Instructions (Signed)

## 2018-10-08 NOTE — Progress Notes (Signed)
This very nice 72 y.o. MWM presents for 1 month follow up with HTN, HLD, OSA, Pre-Diabetes and Vitamin D Deficiency.      Patient is treated for HTN (1995) & BP was 166/84 at 1st OV 1 month ago. Today's BP is still elevated at 144/68. Patient has had no complaints of any cardiac type chest pain, palpitations, dyspnea / orthopnea / PND, dizziness, claudication, or dependent edema.     Hyperlipidemia is controlled with diet & meds. Patient denies myalgias or other med SE's. Last Lipids were at goal albeit elevated Trig's: Lab Results  Component Value Date   CHOL 152 09/02/2018   HDL 33 (L) 09/02/2018   LDLCALC 88 09/02/2018   TRIG 213 (H) 09/02/2018   CHOLHDL 4.6 09/02/2018      Also, the patient has history of Morbid Obesity (BMI 41+) and consequent T2_NIDDM (1995) and has had no symptoms of reactive hypoglycemia, diabetic polys, paresthesias or visual blurring.  When seen 1 month ago as a new patient, he had Actos+Met switched to Metformin 2 gms / day. He was also started on Phentermine & lost 12# in the last month. He admits he has not monitored ANY  CBG's. Last A1c was not at goal.  Lab Results  Component Value Date   HGBA1C 6.6 (H) 09/02/2018   Wt Readings from Last 3 Encounters:  10/08/18 287 lb 3.2 oz (130.3 kg)  10/02/18 287 lb (130.2 kg)  09/02/18 299 lb 9.6 oz (135.9 kg)      Further, the patient has recent discovery of Vitamin D Deficiency and now supplements vitamin D without any suspected side-effects. Last vitamin D was very low.  Lab Results  Component Value Date   VD25OH 29 (L) 09/02/2018   Current Outpatient Medications on File Prior to Visit  Medication Sig  . aspirin (ECOTRIN LOW STRENGTH) 81 MG EC tablet Take 1 tablet daily  . cetirizine (ZYRTEC) 10 MG tablet Take 10 mg by mouth daily.  . ciprofloxacin (CIPRO) 500 MG tablet Take 1 tablet (500 mg total) by mouth 2 (two) times daily for 7 days.  . furosemide (LASIX) 20 MG tablet Take 20 mg by mouth daily.  Marland Kitchen  glimepiride (AMARYL) 4 MG tablet Take 4 mg by mouth 2 (two) times daily.  Marland Kitchen losartan (COZAAR) 100 MG tablet Take 100 mg by mouth every evening.  . metFORMIN (GLUCOPHAGE-XR) 500 MG 24 hr tablet Take 2 tablets 2 x/ day for Diabetes  . metroNIDAZOLE (FLAGYL) 500 MG tablet Take 1 tablet (500 mg total) by mouth 3 (three) times daily.  . phentermine (ADIPEX-P) 37.5 MG tablet Take 1/2 to 1 tablet every morning for dieting & weight loss  . simvastatin (ZOCOR) 20 MG tablet Take 20 mg by mouth at bedtime.    No current facility-administered medications on file prior to visit.    No Known Allergies PMHx:   Past Medical History:  Diagnosis Date  . Arthritis   . Asthma    childhood  . 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 Diabetes mellitus   . Diabetes mellitus type 2 with neurological manifestations ([place]) 10/04/2014  . Diabetes mellitus without complication ([place])   . Dyspnea   . Erectile dysfunction   . GERD (gastroesophageal reflux disease)   . Headache   . Heart murmur   . History of kidney stones   . Hypercholesteremia   . Hyperlipemia   . Hypertension   . Kidney stone   .  Low back pain   . Obesity   . Rotator cuff dysfunction    partial tear , left shoulder  . Sleep apnea    Immunization History  Administered Date(s) Administered  . Pneumococcal-Unspecified 05/05/2009, 03/03/2013  . Td 05/05/2009  . Tdap 06/21/2015  . Zoster 03/03/2013   Past Surgical History:  Procedure Laterality Date  . COLONOSCOPY    . KNEE ARTHROSCOPY    . PILONIDAL CYST EXCISION     from base of spine  . 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 Eyes: Denies  redness, blurred vision, diplopia, discharge, itchy, watery eyes.  ENT: Denies discharge, congestion, post nasal drip, epistaxis, sore throat, earache, hearing loss, dental pain, tinnitus, vertigo, sinus pain, snoring.  CV: Denies chest pain, palpitations, irregular heartbeat, syncope, dyspnea, diaphoresis, orthopnea, PND, claudication or edema. Respiratory: denies cough, dyspnea, DOE, pleurisy, hoarseness, laryngitis, wheezing.  Gastrointestinal: Denies dysphagia, odynophagia, heartburn, reflux, water brash, abdominal pain or cramps, nausea, vomiting, bloating, diarrhea, constipation, hematemesis, melena, hematochezia  or hemorrhoids. Genitourinary: Denies dysuria, frequency, urgency, nocturia, hesitancy, discharge, hematuria or flank pain. Musculoskeletal: Denies arthralgias, myalgias, stiffness, jt. swelling, pain, limping or strain/sprain.  Skin: Denies pruritus, rash, hives, warts, acne, eczema or change in skin lesion(s). Neuro: No weakness, tremor, incoordination, spasms, paresthesia or pain. Psychiatric: Denies confusion, memory loss or sensory loss. Endo: Denies change in weight, skin or hair change.  Heme/Lymph: No excessive bleeding, bruising or enlarged lymph nodes.  Physical Exam  BP (!) 144/68   Pulse 92   Temp 97.8 F (36.6 C)   Resp 18   Ht 5' 11"  (1.803 m)   Wt 287 lb 3.2 oz (130.3 kg)   BMI 40.06 kg/m   Appears  well nourished, well groomed  and in no distress.  Eyes: PERRLA, EOMs, conjunctiva no swelling or erythema. Sinuses: No frontal/maxillary tenderness ENT/Mouth: EAC's clear, TM's nl w/o erythema, bulging. Nares clear w/o erythema, swelling, exudates. Oropharynx clear without erythema or exudates. Oral hygiene is good. Tongue normal, non obstructing. Hearing intact.  Neck: Supple. Thyroid not palpable. Car 2+/2+ without bruits, nodes or JVD. Chest: Respirations nl with BS clear & equal w/o rales, rhonchi, wheezing or stridor.  Cor: Heart sounds normal w/  regular rate and rhythm without sig. murmurs, gallops, clicks or rubs. Peripheral pulses normal and equal  without edema.  Abdomen: Soft & bowel sounds normal. Non-tender w/o guarding, rebound, hernias, masses or organomegaly.  Lymphatics: Unremarkable.  Musculoskeletal: Full ROM all peripheral extremities, joint stability, 5/5 strength and normal gait.  Skin: Warm, dry without exposed rashes, lesions or ecchymosis apparent.  Neuro: Cranial nerves intact, reflexes equal bilaterally. Sensory-motor testing grossly intact. Tendon reflexes grossly intact.  Pysch: Alert & oriented x 3.  Insight and judgement nl & appropriate. No ideations.  Assessment and Plan:  1. Morbid obesity with BMI of 40.0-44.9, adult (HCC)  - topiramate 50 MG tablet; Take 1 tablet 2 x /day at Suppertime & Bedtime  Disp: 60 tab; Rf: 2  2. Essential hypertension  - Continue medication, monitor blood pressure at home.  - Continue DASH diet.  Reminder to go to the ER if any CP,  SOB, nausea, dizziness, severe HA,  changes vision/speech.  3. Hyperlipidemia, mixed  - Continue diet/meds, exercise,& lifestyle modifications.  - Continue monitor periodic cholesterol/liver & renal functions   4. Type 2 diabetes mellitus without complication, without long-term current use of insulin (HCC)  - Continue diet, exercise, lifestyle modifications.  - Monitor appropriate labs.  5. Class 3 severe obesity due to excess calories with serious comorbidity and body mass index (BMI) of 40.0 to 44.9 in adult [facility])       Discussed  regular exercise, BP monitoring, weight control to achieve/maintain BMI less than 25 and discussed med and SE's. Recommended labs to assess and monitor clinical status with further disposition pending results of labs. Over 30 minutes of exam, counseling, chart review was performed.

## 2018-12-15 DIAGNOSIS — N182 Chronic kidney disease, stage 2 (mild): Secondary | ICD-10-CM

## 2018-12-15 DIAGNOSIS — E1122 Type 2 diabetes mellitus with diabetic chronic kidney disease: Secondary | ICD-10-CM | POA: Insufficient documentation

## 2018-12-15 NOTE — Progress Notes (Signed)
FOLLOW UP  Assessment and Plan:   Hypertension Well controlled with current medications  Monitor blood pressure at home; patient to call if consistently greater than 130/80 Continue DASH diet.   Reminder to go to the ER if any CP, SOB, nausea, dizziness, severe HA, changes vision/speech, left arm numbness and tingling and jaw pain.  Cholesterol Currently above goal; discussed LDL goal of <70; simvastatin 20 mg dialy  Continue low cholesterol diet and exercise.  Check lipid panel.   Diabetes with diabetic chronic kidney disease Continue medication: metformin, amaryl - if A1C further improved, will cut back amaryl to 1/2 tab in evenings Continue diet and exercise.  Perform daily foot/skin check, notify office of any concerning changes.  Check A1C  Obesity with co morbidities Long discussion about weight loss, diet, and exercise Recommended diet heavy in fruits and veggies and low in animal meats, cheeses, and dairy products, appropriate calorie intake Discussed ideal weight for height and weight goal (220lb) D/c topamax/phentermine due to SE, and making good progress without these Patient will work on continue with current diet Will follow up in 3 months  Vitamin D Def Below goal at last visit; he has changed dose, taking 10000 IU daily  continue supplementation to maintain goal of 70-100 Check Vit D level  Continue diet and meds as discussed. Further disposition pending results of labs. Discussed med's effects and SE's.   Over 30 minutes of exam, counseling, chart review, and critical decision making was performed.   Future Appointments  Date Time Provider [place]  12/16/2018  2:00 PM Liane Comber, NP [facility] None    ----------------------------------------------------------------------------------------------------------------------  HPI 73 y.o. male  presents for 3 month follow up on hypertension, cholesterol, diabetes, weight and vitamin D deficiency.    he is prescribed phentermine and topamax for weight loss but stopped due to SE/didn't feel well with medication.  While on the medication they have lost 7 lbs since last visit.    BMI is Body mass index is 38.22 kg/m., he is working on diet and exercise. He has cut out potato chips/fries, switched to whole wheat bread. He is using stretch/resistance bands, doing nearly daily. He is drinking diet soda and water, minimum a few glasses  Wt Readings from Last 3 Encounters:  12/16/18 274 lb (124.3 kg)  10/08/18 287 lb 3.2 oz (130.3 kg)  10/02/18 287 lb (130.2 kg)   His blood pressure has been controlled at home, today their BP is BP: 138/72  He does workout. He denies chest pain, shortness of breath, dizziness.   He is on cholesterol medication Simvastatin 20 mg daily and denies myalgias. His cholesterol is not at goal. The cholesterol last visit was:   Lab Results  Component Value Date   CHOL 152 09/02/2018   HDL 33 (L) 09/02/2018   LDLCALC 88 09/02/2018   TRIG 213 (H) 09/02/2018   CHOLHDL 4.6 09/02/2018    He has been working on diet and exercise for T2DM on metformin, amaryl 4 mg BID, and denies increased appetite, nausea, paresthesia of the feet, polydipsia, polyuria, visual disturbances and vomiting. He does check sporadic fasting, ranges 120-145. Last A1C in the office was:  Lab Results  Component Value Date   HGBA1C 6.6 (H) 09/02/2018   Patient is on Vitamin D supplement.   Lab Results  Component Value Date   VD25OH 29 (L) 09/02/2018        Current Medications:  Current Outpatient Medications on File Prior to Visit  Medication Sig  .  aspirin (ECOTRIN LOW STRENGTH) 81 MG EC tablet Take 1 tablet daily  . cetirizine (ZYRTEC) 10 MG tablet Take 10 mg by mouth daily.  . furosemide (LASIX) 20 MG tablet Take 20 mg by mouth daily.  Marland Kitchen glimepiride (AMARYL) 4 MG tablet Take 4 mg by mouth 2 (two) times daily.  Marland Kitchen losartan (COZAAR) 100 MG tablet Take 100 mg by mouth every evening.  .  metFORMIN (GLUCOPHAGE-XR) 500 MG 24 hr tablet Take 2 tablets 2 x/ day for Diabetes  . simvastatin (ZOCOR) 20 MG tablet Take 20 mg by mouth at bedtime.    No current facility-administered medications on file prior to visit.      Allergies: No Known Allergies   Medical History:  Past Medical History:  Diagnosis Date  . Arthritis   . Asthma    childhood  . 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 Diabetes mellitus   . Diabetes mellitus type 2 with neurological manifestations ([place]) 10/04/2014  . Diabetes mellitus without complication ([place])   . Dyspnea   . Erectile dysfunction   . GERD (gastroesophageal reflux disease)   . Headache   . Heart murmur   . History of kidney stones   . Hypercholesteremia   . Hyperlipemia   . Hypertension   . Kidney stone   . Low back pain   . Obesity   . Rotator cuff dysfunction    partial tear , left shoulder  . Sleep apnea    Family history- Reviewed and unchanged Social history- Reviewed and unchanged   Review of Systems:  Review of Systems  Constitutional: Negative for malaise/fatigue and weight loss.  HENT: Negative for hearing loss and tinnitus.   Eyes: Negative for blurred vision and double vision.  Respiratory: Negative for cough, shortness of breath and wheezing.   Cardiovascular: Negative for chest pain, palpitations, orthopnea, claudication and leg swelling.  Gastrointestinal: Negative for abdominal pain, blood in stool, constipation, diarrhea, heartburn, melena, nausea and vomiting.  Genitourinary: Negative.   Musculoskeletal: Negative for joint pain and myalgias.  Skin: Negative for rash.  Neurological: Negative for dizziness, tingling, sensory change, weakness and headaches.  Endo/Heme/Allergies: Negative for polydipsia.  Psychiatric/Behavioral: Negative.   All other systems reviewed and are negative.     Physical Exam: BP 138/72   Pulse 77   Temp 98.1 F (36.7 C)   Ht 5\' 11"  (1.803 m)   Wt 274 lb (124.3 kg)    SpO2 99%   BMI 38.22 kg/m  Wt Readings from Last 3 Encounters:  12/16/18 274 lb (124.3 kg)  10/08/18 287 lb 3.2 oz (130.3 kg)  10/02/18 287 lb (130.2 kg)   General Appearance: Well nourished, in no apparent distress. Eyes: PERRLA, EOMs, conjunctiva no swelling or erythema Sinuses: No Frontal/maxillary tenderness ENT/Mouth: Ext aud canals clear, TMs without erythema, bulging. No erythema, swelling, or exudate on post pharynx.  Tonsils not swollen or erythematous. Hearing normal.  Neck: Supple, thyroid normal.  Respiratory: Respiratory effort normal, BS equal bilaterally without rales, rhonchi, wheezing or stridor.  Cardio: RRR with no MRGs. Brisk peripheral pulses without edema.  Abdomen: Soft, + BS.  Non tender, no guarding, rebound, hernias, masses. Lymphatics: Non tender without lymphadenopathy.  Musculoskeletal: Full ROM, 5/5 strength, Normal gait Skin: Warm, dry without rashes, lesions, ecchymosis.  Neuro: Cranial nerves intact. No cerebellar symptoms.  Psych: Awake and oriented X 3, normal affect, Insight and Judgment appropriate.    Izora Ribas, NP 1:55 PM [facility]

## 2018-12-16 ENCOUNTER — Encounter: Payer: Self-pay | Admitting: Adult Health

## 2018-12-16 ENCOUNTER — Ambulatory Visit: Payer: 59 | Admitting: Adult Health

## 2018-12-16 VITALS — BP 138/72 | HR 77 | Temp 98.1°F | Ht 71.0 in | Wt 274.0 lb

## 2018-12-16 DIAGNOSIS — E1122 Type 2 diabetes mellitus with diabetic chronic kidney disease: Secondary | ICD-10-CM | POA: Diagnosis not present

## 2018-12-16 DIAGNOSIS — N182 Chronic kidney disease, stage 2 (mild): Secondary | ICD-10-CM

## 2018-12-16 DIAGNOSIS — Z79899 Other long term (current) drug therapy: Secondary | ICD-10-CM

## 2018-12-16 DIAGNOSIS — I10 Essential (primary) hypertension: Secondary | ICD-10-CM | POA: Diagnosis not present

## 2018-12-16 DIAGNOSIS — E1169 Type 2 diabetes mellitus with other specified complication: Secondary | ICD-10-CM

## 2018-12-16 DIAGNOSIS — E559 Vitamin D deficiency, unspecified: Secondary | ICD-10-CM | POA: Diagnosis not present

## 2018-12-16 DIAGNOSIS — Z6841 Body Mass Index (BMI) 40.0 and over, adult: Secondary | ICD-10-CM

## 2018-12-16 DIAGNOSIS — E785 Hyperlipidemia, unspecified: Secondary | ICD-10-CM

## 2018-12-16 NOTE — Patient Instructions (Addendum)
Goals    . DIET - INCREASE WATER INTAKE     65-80+ fluid ounces daily     . LDL CALC < 70    . Weight (lb) < 220 lb (99.8 kg)       If A1C is even lower this time, will plan to cut back on amaryl to 1/2 tab only in the evening   Aim for 7+ servings of fruits and vegetables daily  65-80+ fluid ounces of water or unsweet tea for healthy kidneys  Limit to max 1 drink of alcohol per day; avoid smoking/tobacco  Limit animal fats in diet for cholesterol and heart health - choose grass fed whenever available  Avoid highly processed foods, and foods high in saturated/trans fats  Aim for low stress - take time to unwind and care for your mental health  Aim for 150 min of moderate intensity exercise weekly for heart health, and weights twice weekly for bone health  Aim for 7-9 hours of sleep daily       When it comes to diets, agreement about the perfect plan isn't easy to find, even among the experts. Experts at the [employer] developed an idea known as the Healthy Eating Plate. Just imagine a plate divided into logical, healthy portions.  The emphasis is on diet quality:  Load up on vegetables and fruits - one-half of your plate: Aim for color and variety, and remember that potatoes don't count.  Go for whole grains - one-quarter of your plate: Whole wheat, barley, wheat berries, quinoa, oats, brown rice, and foods made with them. If you want pasta, go with whole wheat pasta.  Protein power - one-quarter of your plate: Fish, chicken, beans, and nuts are all healthy, versatile protein sources. Limit red meat.  The diet, however, does go beyond the plate, offering a few other suggestions.  Use healthy plant oils, such as olive, canola, soy, corn, sunflower and peanut. Check the labels, and avoid partially hydrogenated oil, which have unhealthy trans fats.  If you're thirsty, drink water. Coffee and tea are good in moderation, but skip sugary drinks and limit  milk and dairy products to one or two daily servings.  The type of carbohydrate in the diet is more important than the amount. Some sources of carbohydrates, such as vegetables, fruits, whole grains, and beans-are healthier than others.  Finally, stay active.

## 2018-12-17 LAB — CBC WITH DIFFERENTIAL/PLATELET
Absolute Monocytes: 729 cells/uL (ref 200–950)
BASOS PCT: 0.7 %
Basophils Absolute: 63 cells/uL (ref 0–200)
EOS ABS: 117 {cells}/uL (ref 15–500)
Eosinophils Relative: 1.3 %
HCT: 43 % (ref 38.5–50.0)
Hemoglobin: 14.8 g/dL (ref 13.2–17.1)
Lymphs Abs: 2466 cells/uL (ref 850–3900)
MCH: 30.5 pg (ref 27.0–33.0)
MCHC: 34.4 g/dL (ref 32.0–36.0)
MCV: 88.5 fL (ref 80.0–100.0)
MONOS PCT: 8.1 %
MPV: 10.3 fL (ref 7.5–12.5)
Neutro Abs: 5625 cells/uL (ref 1500–7800)
Neutrophils Relative %: 62.5 %
Platelets: 346 10*3/uL (ref 140–400)
RBC: 4.86 10*6/uL (ref 4.20–5.80)
RDW: 12.4 % (ref 11.0–15.0)
Total Lymphocyte: 27.4 %
WBC: 9 10*3/uL (ref 3.8–10.8)

## 2018-12-17 LAB — TSH: TSH: 2.86 m[IU]/L (ref 0.40–4.50)

## 2018-12-17 LAB — LIPID PANEL
CHOL/HDL RATIO: 4.7 (calc) (ref <5.0)
Cholesterol: 151 mg/dL (ref <200)
HDL: 32 mg/dL — ABNORMAL LOW (ref >40)
LDL Cholesterol (Calc): 86 mg/dL (calc)
NON-HDL CHOLESTEROL (CALC): 119 mg/dL (ref <130)
Triglycerides: 239 mg/dL — ABNORMAL HIGH (ref <150)

## 2018-12-17 LAB — COMPLETE METABOLIC PANEL WITH GFR
AG Ratio: 1.7 (calc) (ref 1.0–2.5)
ALBUMIN MSPROF: 4.4 g/dL (ref 3.6–5.1)
ALKALINE PHOSPHATASE (APISO): 44 U/L (ref 40–115)
ALT: 28 U/L (ref 9–46)
AST: 22 U/L (ref 10–35)
BUN: 19 mg/dL (ref 7–25)
CO2: 22 mmol/L (ref 20–32)
CREATININE: 1.13 mg/dL (ref 0.70–1.18)
Calcium: 9.5 mg/dL (ref 8.6–10.3)
Chloride: 104 mmol/L (ref 98–110)
GFR, EST AFRICAN AMERICAN: 75 mL/min/{1.73_m2} (ref >=60)
GFR, EST NON AFRICAN AMERICAN: 65 mL/min/{1.73_m2} (ref >=60)
GLOBULIN: 2.6 g/dL (ref 1.9–3.7)
Glucose, Bld: 69 mg/dL (ref 65–99)
Potassium: 4.5 mmol/L (ref 3.5–5.3)
SODIUM: 137 mmol/L (ref 135–146)
Total Bilirubin: 0.3 mg/dL (ref 0.2–1.2)
Total Protein: 7 g/dL (ref 6.1–8.1)

## 2018-12-17 LAB — VITAMIN D 25 HYDROXY (VIT D DEFICIENCY, FRACTURES): Vit D, 25-Hydroxy: 67 ng/mL (ref 30–100)

## 2018-12-17 LAB — HEMOGLOBIN A1C
HEMOGLOBIN A1C: 6.4 %{Hb} — AB (ref <5.7)
Mean Plasma Glucose: 137 (calc)
eAG (mmol/L): 7.6 (calc)

## 2018-12-17 LAB — MAGNESIUM: MAGNESIUM: 2 mg/dL (ref 1.5–2.5)

## 2019-01-12 LAB — HM DIABETES EYE EXAM

## 2019-01-26 ENCOUNTER — Encounter: Payer: Self-pay | Admitting: Internal Medicine

## 2019-02-25 ENCOUNTER — Other Ambulatory Visit: Payer: Self-pay | Admitting: Internal Medicine

## 2019-02-25 DIAGNOSIS — E119 Type 2 diabetes mellitus without complications: Secondary | ICD-10-CM

## 2019-02-27 ENCOUNTER — Other Ambulatory Visit: Payer: Self-pay | Admitting: Internal Medicine

## 2019-02-27 DIAGNOSIS — E119 Type 2 diabetes mellitus without complications: Secondary | ICD-10-CM

## 2019-02-27 MED ORDER — METFORMIN HCL ER 500 MG PO TB24: ORAL_TABLET | ORAL | 1 refills | Status: DC

## 2019-04-08 ENCOUNTER — Encounter: Payer: Self-pay | Admitting: Internal Medicine

## 2019-08-30 ENCOUNTER — Other Ambulatory Visit: Payer: Self-pay | Admitting: Internal Medicine

## 2019-08-30 DIAGNOSIS — E119 Type 2 diabetes mellitus without complications: Secondary | ICD-10-CM

## 2019-09-22 ENCOUNTER — Other Ambulatory Visit: Payer: Self-pay | Admitting: Internal Medicine

## 2019-09-22 DIAGNOSIS — Z91199 Patient's noncompliance with other medical treatment and regimen due to unspecified reason: Secondary | ICD-10-CM

## 2019-09-22 DIAGNOSIS — Z724 Inappropriate diet and eating habits: Secondary | ICD-10-CM | POA: Insufficient documentation

## 2019-09-22 DIAGNOSIS — Z9119 Patient's noncompliance with other medical treatment and regimen: Secondary | ICD-10-CM | POA: Insufficient documentation

## 2019-09-23 NOTE — Patient Instructions (Addendum)
Joshua Koch , Thank you for taking time to come for your Medicare Wellness Visit. I appreciate your ongoing commitment to your health goals. Please review the following plan we discussed and let me know if I can assist you in the future.   These are the goals we discussed: Goals    . DIET - INCREASE WATER INTAKE     65-80+ fluid ounces daily     . LDL CALC < 70    . Weight (lb) < 220 lb (99.8 kg)       This is a list of the screening recommended for you and due dates:  Health Maintenance  Topic Date Due  .  Hepatitis C: One time screening is recommended by Center for Disease Control  (CDC) for  adults born from [birth year] through 1964.   Nov 07, 1946  . Complete foot exam   09/16/1957  . Colon Cancer Screening  09/16/1997  . Pneumonia vaccines (2 of 2 - PCV13) 03/03/2014  . Hemoglobin A1C  06/16/2019  . Flu Shot  07/11/2019  . Eye exam for diabetics  01/13/2020  . Tetanus Vaccine  06/20/2025    You are due for colonoscopy and dental exam to complete your health maintenance.        Vit D  & Vit C 1,000 mg   are recommended to help protect  against the Covid-19 and other Corona viruses.    Also it's recommended  to take  Zinc 50 mg  to help  protect against the Covid-19   and best place to get  is also on [email or web address]  and don't pay more than 6-8 cents /pill !  ================================ Coronavirus (COVID-19) Are you at risk?  Are you at risk for the Coronavirus (COVID-19)?  To be considered HIGH RISK for Coronavirus (COVID-19), you have to meet the following criteria:  . Traveled to [place], [place], [place], [place] or [place]; or in the [place] to [place], [place], [place]  . or [place]; and have fever, cough, and shortness of breath within the last 2 weeks of travel OR . Been in close contact with a person diagnosed with COVID-19 within the last 2 weeks and have  . fever, cough,and shortness of breath .  . IF YOU DO NOT MEET THESE CRITERIA, YOU ARE  CONSIDERED LOW RISK FOR COVID-19.  What to do if you are HIGH RISK for COVID-19?  Marland Kitchen If you are having a medical emergency, call 911. . Seek medical care right away. Before you go to a doctor's office, urgent care or emergency department, .  call ahead and tell them about your recent travel, contact with someone diagnosed with COVID-19  .  and your symptoms.  . You should receive instructions from your physician's office regarding next steps of care.  . When you arrive at healthcare provider, tell the healthcare staff immediately you have returned from  . visiting [place], [place], [place], [place] or [place]; or traveled in the [place] to [place], [place],  . [place] or [place] in the last two weeks or you have been in close contact with a person diagnosed with  . COVID-19 in the last 2 weeks.   . Tell the health care staff about your symptoms: fever, cough and shortness of breath. . After you have been seen by a medical provider, you will be either: o Tested for (COVID-19) and discharged home on quarantine except to seek medical care if  o symptoms worsen,  and asked to  - Stay home and avoid contact with others until you get your results (4-5 days)  - Avoid travel on public transportation if possible (such as bus, train, or airplane) or o Sent to the Emergency Department by EMS for evaluation, COVID-19 testing  and  o possible admission depending on your condition and test results.  What to do if you are LOW RISK for COVID-19?  Reduce your risk of any infection by using the same precautions used for avoiding the common cold or flu:  Marland Kitchen Wash your hands often with soap and warm water for at least 20 seconds.  If soap and water are not readily available,  . use an alcohol-based hand sanitizer with at least 60% alcohol.  . If coughing or sneezing, cover your mouth and nose by coughing or sneezing into the elbow areas of your shirt or coat, .  into a tissue or into your sleeve (not  your hands). . Avoid shaking hands with others and consider head nods or verbal greetings only. . Avoid touching your eyes, nose, or mouth with unwashed hands.  . Avoid close contact with people who are sick. . Avoid places or events with large numbers of people in one location, like concerts or sporting events. . Carefully consider travel plans you have or are making. . If you are planning any travel outside or inside the [place], visit the CDC's Travelers' Health webpage for the latest health notices. . If you have some symptoms but not all symptoms, continue to monitor at home and seek medical attention  . if your symptoms worsen. . If you are having a medical emergency, call 911. >>>>>>>>>>>>>>>>>>>>>>>>>>>>>>>>>>>>>>>>>>>>>>>>>>>>>>> We Do NOT Approve of  [facility] [place] Soliciting Our Patients  To Do Home Visits  & We Do NOT Approve of [employer] > > > > > > > > > > > > > > > > > > > > > > > > > > > > > > > > > > >  > > > >   Preventive Care for Adults  A healthy lifestyle and preventive care can promote health and wellness. Preventive health guidelines for men include the following key practices:  A routine yearly physical is a good way to check with your health care provider about your health and preventative screening. It is a chance to share any concerns and updates on your health and to receive a thorough exam.  Visit your dentist for a routine exam and preventative care every 6 months. Brush your teeth twice a day and floss once a day. Good oral hygiene prevents tooth decay and gum disease.  The frequency of eye exams is based on your age, health, family medical history, use of contact lenses, and other factors. Follow your health care provider's recommendations for frequency of eye exams.  Eat a healthy diet. Foods such as vegetables, fruits, whole grains, low-fat dairy products, and lean protein foods contain the nutrients you need without too many  calories. Decrease your intake of foods high in solid fats, added sugars, and salt. Eat the right amount of calories for you. Get information about a proper diet from your health care provider, if necessary.  Regular physical exercise is one of the most important things you can do for your health. Most adults should get at least 150 minutes of moderate-intensity exercise (any activity that increases your heart rate and causes you to sweat) each week. In addition, most adults need muscle-strengthening  exercises on 2 or more days a week.  Maintain a healthy weight. The body mass index (BMI) is a screening tool to identify possible weight problems. It provides an estimate of body fat based on height and weight. Your health care provider can find your BMI and can help you achieve or maintain a healthy weight. For adults 20 years and older:  A BMI below 18.5 is considered underweight.  A BMI of 18.5 to 24.9 is normal.  A BMI of 25 to 29.9 is considered overweight.  A BMI of 30 and above is considered obese.  Maintain normal blood lipids and cholesterol levels by exercising and minimizing your intake of saturated fat. Eat a balanced diet with plenty of fruit and vegetables. Blood tests for lipids and cholesterol should begin at age 31 and be repeated every 5 years. If your lipid or cholesterol levels are high, you are over 50, or you are at high risk for heart disease, you may need your cholesterol levels checked more frequently. Ongoing high lipid and cholesterol levels should be treated with medicines if diet and exercise are not working.  If you smoke, find out from your health care provider how to quit. If you do not use tobacco, do not start.  Lung cancer screening is recommended for adults aged 82-80 years who are at high risk for developing lung cancer because of a history of smoking. A yearly low-dose CT scan of the lungs is recommended for people who have at least a 30-pack-year history of  smoking and are a current smoker or have quit within the past 15 years. A pack year of smoking is smoking an average of 1 pack of cigarettes a day for 1 year (for example: 1 pack a day for 30 years or 2 packs a day for 15 years). Yearly screening should continue until the smoker has stopped smoking for at least 15 years. Yearly screening should be stopped for people who develop a health problem that would prevent them from having lung cancer treatment.  If you choose to drink alcohol, do not have more than 2 drinks per day. One drink is considered to be 12 ounces (355 mL) of beer, 5 ounces (148 mL) of wine, or 1.5 ounces (44 mL) of liquor.  Avoid use of street drugs. Do not share needles with anyone. Ask for help if you need support or instructions about stopping the use of drugs.  High blood pressure causes heart disease and increases the risk of stroke. Your blood pressure should be checked at least every 1-2 years. Ongoing high blood pressure should be treated with medicines, if weight loss and exercise are not effective.  If you are 6-70 years old, ask your health care provider if you should take aspirin to prevent heart disease.  Diabetes screening involves taking a blood sample to check your fasting blood sugar level. Testing should be considered at a younger age or be carried out more frequently if you are overweight and have at least 1 risk factor for diabetes.  Colorectal cancer can be detected and often prevented. Most routine colorectal cancer screening begins at the age of 58 and continues through age 44. However, your health care provider may recommend screening at an earlier age if you have risk factors for colon cancer. On a yearly basis, your health care provider may provide home test kits to check for hidden blood in the stool. Use of a small camera at the end of a tube to directly examine  the colon (sigmoidoscopy or colonoscopy) can detect the earliest forms of colorectal cancer. Talk  to your health care provider about this at age 48, when routine screening begins. Direct exam of the colon should be repeated every 5-10 years through age 62, unless early forms of precancerous polyps or small growths are found.  Hepatitis C blood testing is recommended for all people born from [birth year] through 1965 and any individual with known risks for hepatitis C.  Screening for abdominal aortic aneurysm (AAA)  by ultrasound is recommended for people who have history of high blood pressure or who are current or former smokers.  Healthy men should  receive prostate-specific antigen (PSA) blood tests as part of routine cancer screening. Talk with your health care provider about prostate cancer screening.  Testicular cancer screening is  recommended for adult males. Screening includes self-exam, a health care provider exam, and other screening tests. Consult with your health care provider about any symptoms you have or any concerns you have about testicular cancer.  Use sunscreen. Apply sunscreen liberally and repeatedly throughout the day. You should seek shade when your shadow is shorter than you. Protect yourself by wearing long sleeves, pants, a wide-brimmed hat, and sunglasses year round, whenever you are outdoors.  Once a month, do a whole-body skin exam, using a mirror to look at the skin on your back. Tell your health care provider about new moles, moles that have irregular borders, moles that are larger than a pencil eraser, or moles that have changed in shape or color.  Stay current with required vaccines (immunizations).  Influenza vaccine. All adults should be immunized every year.  Tetanus, diphtheria, and acellular pertussis (Td, Tdap) vaccine. An adult who has not previously received Tdap or who does not know his vaccine status should receive 1 dose of Tdap. This initial dose should be followed by tetanus and diphtheria toxoids (Td) booster doses every 10 years. Adults with an unknown  or incomplete history of completing a 3-dose immunization series with Td-containing vaccines should begin or complete a primary immunization series including a Tdap dose. Adults should receive a Td booster every 10 years.  Zoster vaccine. One dose is recommended for adults aged 55 years or older unless certain conditions are present.    PREVNAR - Pneumococcal 13-valent conjugate (PCV13) vaccine. When indicated, a person who is uncertain of his immunization history and has no record of immunization should receive the PCV13 vaccine. An adult aged 18 years or older who has certain medical conditions and has not been previously immunized should receive 1 dose of PCV13 vaccine. This PCV13 should be followed with a dose of pneumococcal polysaccharide (PPSV23) vaccine. The PPSV23 vaccine dose should be obtained 1 or more year(s)after the dose of PCV13 vaccine. An adult aged 68 years or older who has certain medical conditions and previously received 1 or more doses of PPSV23 vaccine should receive 1 dose of PCV13. The PCV13 vaccine dose should be obtained 1 or more years after the last PPSV23 vaccine dose.    PNEUMOVAX - Pneumococcal polysaccharide (PPSV23) vaccine. When PCV13 is also indicated, PCV13 should be obtained first. All adults aged 37 years and older should be immunized. An adult younger than age 76 years who has certain medical conditions should be immunized. Any person who resides in a nursing home or long-term care facility should be immunized. An adult smoker should be immunized. People with an immunocompromised condition and certain other conditions should receive both PCV13 and PPSV23 vaccines. People with  human immunodeficiency virus (HIV) infection should be immunized as soon as possible after diagnosis. Immunization during chemotherapy or radiation therapy should be avoided. Routine use of PPSV23 vaccine is not recommended for American Indians, [place] Natives, or people younger than 65 years  unless there are medical conditions that require PPSV23 vaccine. When indicated, people who have unknown immunization and have no record of immunization should receive PPSV23 vaccine. One-time revaccination 5 years after the first dose of PPSV23 is recommended for people aged 19-64 years who have chronic kidney failure, nephrotic syndrome, asplenia, or immunocompromised conditions. People who received 1-2 doses of PPSV23 before age 76 years should receive another dose of PPSV23 vaccine at age 84 years or later if at least 5 years have passed since the previous dose. Doses of PPSV23 are not needed for people immunized with PPSV23 at or after age 57 years.    Hepatitis A vaccine. Adults who wish to be protected from this disease, have certain high-risk conditions, work with hepatitis A-infected animals, work in hepatitis A research labs, or travel to or work in countries with a high rate of hepatitis A should be immunized. Adults who were previously unvaccinated and who anticipate close contact with an international adoptee during the first 60 days after arrival in the [place] from a country with a high rate of hepatitis A should be immunized.    Hepatitis B vaccine. Adults should be immunized if they wish to be protected from this disease, have certain high-risk conditions, may be exposed to blood or other infectious body fluids, are household contacts or sex partners of hepatitis B positive people, are clients or workers in certain care facilities, or travel to or work in countries with a high rate of hepatitis B.   Preventive Service / Frequency   Ages 35 and over  Blood pressure check.  Lipid and cholesterol check.  Lung cancer screening. / Every year if you are aged 61-80 years and have a 30-pack-year history of smoking and currently smoke or have quit within the past 15 years. Yearly screening is stopped once you have quit smoking for at least 15 years or develop a health problem that  would prevent you from having lung cancer treatment.  Fecal occult blood test (FOBT) of stool. You may not have to do this test if you get a colonoscopy every 10 years.  Flexible sigmoidoscopy** or colonoscopy.** / Every 5 years for a flexible sigmoidoscopy or every 10 years for a colonoscopy beginning at age 29 and continuing until age 63.  Hepatitis C blood test.** / For all people born from [birth year] through 1964 and any individual with known risks for hepatitis C.  Abdominal aortic aneurysm (AAA) screening./ Screening current or former smokers or have Hypertension.  Skin self-exam. / Monthly.  Influenza vaccine. / Every year.  Tetanus, diphtheria, and acellular pertussis (Tdap/Td) vaccine.** / 1 dose of Td every 10 years.   Zoster vaccine.** / 1 dose for adults aged 67 years or older.         Pneumococcal 13-valent conjugate (PCV13) vaccine.    Pneumococcal polysaccharide (PPSV23) vaccine.     Hepatitis A vaccine.** / Consult your health care provider.  Hepatitis B vaccine.** / Consult your health care provider. Screening for abdominal aortic aneurysm (AAA)  by ultrasound is recommended for people who have history of high blood pressure or who are current or former smokers. ++++++++++ Recommend Adult Low Dose Aspirin or  coated  Aspirin 81 mg daily  To reduce risk  of Colon Cancer 40 %,  Skin Cancer 26 % ,  Malignant Melanoma 46%  and  Pancreatic cancer 60% ++++++++++++++++++++++ Vitamin D goal  is between 70-100.  Please make sure that you are taking your Vitamin D as directed.  It is very important as a natural anti-inflammatory  helping hair, skin, and nails, as well as reducing stroke and heart attack risk.  It helps your bones and helps with mood. It also decreases numerous cancer risks so please take it as directed.  Low Vit D is associated with a 200-300% higher risk for CANCER  and 200-300% higher risk for HEART   ATTACK  &  STROKE.    .....................................Marland Kitchen It is also associated with higher death rate at younger ages,  autoimmune diseases like Rheumatoid arthritis, Lupus, Multiple Sclerosis.    Also many other serious conditions, like depression, Alzheimer's Dementia, infertility, muscle aches, fatigue, fibromyalgia - just to name a few. ++++++++++++++++++++++ Recommend the book "The END of DIETING" by Dr Excell Seltzer  & the book "The END of DIABETES " by Dr Excell Seltzer At [email or web address] - get book & Audio CD's    Being diabetic has a  300% increased risk for heart attack, stroke, cancer, and alzheimer- type vascular dementia. It is very important that you work harder with diet by avoiding all foods that are white. Avoid white rice (brown & wild rice is OK), white potatoes (sweetpotatoes in moderation is OK), White bread or wheat bread or anything made out of white flour like bagels, donuts, rolls, buns, biscuits, cakes, pastries, cookies, pizza crust, and pasta (made from white flour & egg whites) - vegetarian pasta or spinach or wheat pasta is OK. Multigrain breads like Arnold's or [employer], or multigrain sandwich thins or flatbreads.  Diet, exercise and weight loss can reverse and cure diabetes in the early stages.  Diet, exercise and weight loss is very important in the control and prevention of complications of diabetes which affects every system in your body, ie. Brain - dementia/stroke, eyes - glaucoma/blindness, heart - heart attack/heart failure, kidneys - dialysis, stomach - gastric paralysis, intestines - malabsorption, nerves - severe painful neuritis, circulation - gangrene & loss of a leg(s), and finally cancer and Alzheimers.    I recommend avoid fried & greasy foods,  sweets/candy, white rice (brown or wild rice or Quinoa is OK), white potatoes (sweet potatoes are OK) - anything made from white flour - bagels, doughnuts, rolls, buns, biscuits,white and wheat breads, pizza crust and traditional  pasta made of white flour & egg white(vegetarian pasta or spinach or wheat pasta is OK).  Multi-grain bread is OK - like multi-grain flat bread or sandwich thins. Avoid alcohol in excess. Exercise is also important.    Eat all the vegetables you want - avoid meat, especially red meat and dairy - especially cheese.  Cheese is the most concentrated form of trans-fats which is the worst thing to clog up our arteries. Veggie cheese is OK which can be found in the fresh produce section at [employer] or [employer] or [employer]  ++++++++++++++++++++++ DASH Eating Plan  DASH stands for "Dietary Approaches to Stop Hypertension."   The DASH eating plan is a healthy eating plan that has been shown to reduce high blood pressure (hypertension). Additional health benefits may include reducing the risk of type 2 diabetes mellitus, heart disease, and stroke. The DASH eating plan may also help with weight loss. WHAT DO I NEED TO KNOW ABOUT THE DASH EATING PLAN?  For the DASH eating plan, you will follow these general guidelines:  Choose foods with a percent daily value for sodium of less than 5% (as listed on the food label).  Use salt-free seasonings or herbs instead of table salt or sea salt.  Check with your health care provider or pharmacist before using salt substitutes.  Eat lower-sodium products, often labeled as "lower sodium" or "no salt added."  Eat fresh foods.  Eat more vegetables, fruits, and low-fat dairy products.  Choose whole grains. Look for the word "whole" as the first word in the ingredient list.  Choose fish   Limit sweets, desserts, sugars, and sugary drinks.  Choose heart-healthy fats.  Eat veggie cheese   Eat more home-cooked food and less restaurant, buffet, and fast food.  Limit fried foods.  Cook foods using methods other than frying.  Limit canned vegetables. If you do use them, rinse them well to decrease the sodium.  When eating at a restaurant, ask that  your food be prepared with less salt, or no salt if possible.                      WHAT FOODS CAN I EAT? Read Dr Fara Olden Fuhrman's books on The End of Dieting & The End of Diabetes  Grains Whole grain or whole wheat bread. Brown rice. Whole grain or whole wheat pasta. Quinoa, bulgur, and whole grain cereals. Low-sodium cereals. Corn or whole wheat flour tortillas. Whole grain cornbread. Whole grain crackers. Low-sodium crackers.  Vegetables Fresh or frozen vegetables (raw, steamed, roasted, or grilled). Low-sodium or reduced-sodium tomato and vegetable juices. Low-sodium or reduced-sodium tomato sauce and paste. Low-sodium or reduced-sodium canned vegetables.   Fruits All fresh, canned (in natural juice), or frozen fruits.  Protein Products  All fish and seafood.  Dried beans, peas, or lentils. Unsalted nuts and seeds. Unsalted canned beans.  Dairy Low-fat dairy products, such as skim or 1% milk, 2% or reduced-fat cheeses, low-fat ricotta or cottage cheese, or plain low-fat yogurt. Low-sodium or reduced-sodium cheeses.  Fats and Oils Tub margarines without trans fats. Light or reduced-fat mayonnaise and salad dressings (reduced sodium). Avocado. Safflower, olive, or canola oils. Natural peanut or almond butter.  Other Unsalted popcorn and pretzels. The items listed above may not be a complete list of recommended foods or beverages. Contact your dietitian for more options.  ++++++++++++++++++++  WHAT FOODS ARE NOT RECOMMENDED? Grains/ White flour or wheat flour White bread. White pasta. White rice. Refined cornbread. Bagels and croissants. Crackers that contain trans fat.  Vegetables  Creamed or fried vegetables. Vegetables in a . Regular canned vegetables. Regular canned tomato sauce and paste. Regular tomato and vegetable juices.  Fruits Dried fruits. Canned fruit in light or heavy syrup. Fruit juice.  Meat and Other Protein Products Meat in general - RED meat & White meat.   Fatty cuts of meat. Ribs, chicken wings, all processed meats as bacon, sausage, bologna, salami, fatback, hot dogs, bratwurst and packaged luncheon meats.  Dairy Whole or 2% milk, cream, half-and-half, and cream cheese. Whole-fat or sweetened yogurt. Full-fat cheeses or blue cheese. Non-dairy creamers and whipped toppings. Processed cheese, cheese spreads, or cheese curds.  Condiments Onion and garlic salt, seasoned salt, table salt, and sea salt. Canned and packaged gravies. Worcestershire sauce. Tartar sauce. Barbecue sauce. Teriyaki sauce. Soy sauce, including reduced sodium. Steak sauce. Fish sauce. Oyster sauce. Cocktail sauce. Horseradish. Ketchup and mustard. Meat flavorings and tenderizers. Bouillon cubes. Hot sauce. Tabasco sauce.  Marinades. Taco seasonings. Relishes.  Fats and Oils Butter, stick margarine, lard, shortening and bacon fat. Coconut, palm kernel, or palm oils. Regular salad dressings.  Pickles and olives. Salted popcorn and pretzels.  The items listed above may not be a complete list of foods and beverages to avoid.

## 2019-09-23 NOTE — Progress Notes (Signed)
MEDICARE ANNUAL WELLNESS VISIT AND 3 MONTH FOLLOW UP Assessment:   [name] was seen today for follow-up and medicare wellness.  Diagnoses and all orders for this visit:  Essential hypertension Continue current medications: Monitor blood pressure at home; call if consistently over 130/80 Continue DASH diet.   Reminder to go to the ER if any CP, SOB, nausea, dizziness, severe HA, changes vision/speech, left arm numbness and tingling and jaw pain. -     CBC with Differential/Platelet -     COMPLETE METABOLIC PANEL WITH GFR -     Magnesium -     CBC with Differential/Platelet -     COMPLETE METABOLIC PANEL WITH GFR -     Magnesium  Hyperlipidemia associated with type 2 diabetes mellitus (HCC) Discussed dietary and exercise modifications Low fat diet Continue medictions -     simvastatin (ZOCOR) 20 MG tablet; Take 1 tablet (20 mg total) by mouth at bedtime. -     Lipid panel  Aortic atherosclerosis (HCC) Monitor glucose, B/P, and weight  Vitamin D deficiency Not taking supplementation at this time. -     VITAMIN D 25 Hydroxy (Vit-D Deficiency)  Type 2 diabetes mellitus with stage 2 chronic kidney disease, without long-term current use of insulin (HCC) Needs to check glucose, fasting Last check nine months ago, 6.4. -     metFORMIN (GLUCOPHAGE-XR) 500 MG 24 hr tablet; Take 2 tablets 2 x /day with meals for Diabetes  -     glimepiride (AMARYL) 4 MG tablet; Take 1 tablet (4 mg total) by mouth 2 (two) times daily. -     Hemoglobin A1c -     Insulin, random  CKD stage 2 due to type 2 diabetes mellitus (HCC) Continue glucose monitoring Continue medications Increase fluids Avoid NSAIDS Discussed dietary modifications and exercise Will continue to monitor  Morbid obesity with BMI of 40.0-44.9, adult (HCC) Discussed dietary and exercise modifications  Osteoarthritis of knee, unspecified laterality, unspecified osteoarthritis type Managing by avoiding activities Has seen ortho  in past Doing well at this times Discussed hygiene of mask and tubing  OSA on CPAP Doing well at this time Discussed dietary and exercise modifications  Need for influenza vaccination Declined today, discussed with patient.  Screening for thyroid disorder -     TSH   Type 2 diabetes mellitus without complication, without long-term current use of insulin (HCC)  Encounter for hepatitis C virus screening test for high risk patient  Need for hepatitis C screening test -     Hepatitis C antibody  Screening for blood or protein in urine -     Urine Culture   Right knee, 2 years ago, some sciatica.  Over 40 minutes of interview exam, counseling, chart review, and critical decision making was performed  No future appointments.   Plan:   During the course of the visit the patient was educated and counseled about appropriate screening and preventive services including:    Pneumococcal vaccine   Influenza vaccine  Prevnar 13  Td vaccine  Screening electrocardiogram  Colorectal cancer screening  Diabetes screening  Glaucoma screening  Nutrition counseling    Subjective:  Joshua Koch is a 73 y.o. male who presents for Medicare Annual Wellness Visit and 3 month follow up for HTN, HLD, DMII, CKD, OSA, Morbid obesity and vitamin D Def.   Reports that he is not check ing his fasting glucose at home.  He reports that he can tell if he has hypoglycemia by mean of  getting "jiters".  He does not check his blood glucose if he feels bad.  He reports that he is taking metformin 500mg , two tablets twice a day and glimepride 4mg  BID.  He reports he does not count carbohydrates and he has not been monitoring his diet since COVID19 restrictions.     He is having an increase in right leg pain.  He reports the pain is shotting and intermittent in quality  He reports that he has seen ortho in the past for this but thinks he would rather pursue Neurology after COVID-19 restrictions are  decreased.  Reports he is concerned about COVID19 and he has been isolating himself and only going out when necessary. His blood pressure today is BP: (!) 146/72.  He does not check his blood pressure at home.   He does not workout. He denies chest pain, shortness of breath, dizziness.  He is on cholesterol medication and denies myalgias. His cholesterol is not at goal. The cholesterol last visit was:   Lab Results  Component Value Date   CHOL 151 12/16/2018   HDL 32 (L) 12/16/2018   LDLCALC 86 12/16/2018   TRIG 239 (H) 12/16/2018   CHOLHDL 4.7 12/16/2018   He has not been working on diet and exercise for DMII, and denies polydipsia, polyuria, visual disturbances, vomiting and weight loss. Last A1C in the office was:  Lab Results  Component Value Date   HGBA1C 6.4 (H) 12/16/2018   Patient has history of CKD and Last GFR. Lab Results  Component Value Date   GFRNONAA 65 12/16/2018     Lab Results  Component Value Date   GFRAA 75 12/16/2018   Patient is on Vitamin D supplement.   Lab Results  Component Value Date   VD25OH 67 12/16/2018      Medication Review:  Current Outpatient Medications (Endocrine & Metabolic):  .  glimepiride (AMARYL) 4 MG tablet, Take 1 tablet (4 mg total) by mouth 2 (two) times daily. .  metFORMIN (GLUCOPHAGE-XR) 500 MG 24 hr tablet, Take 2 tablets 2 x /day with meals for Diabetes Need Office Visit before further Refills  Current Outpatient Medications (Cardiovascular):  .  losartan (COZAAR) 100 MG tablet, Take 100 mg by mouth every evening. .  simvastatin (ZOCOR) 20 MG tablet, Take 1 tablet (20 mg total) by mouth at bedtime.  Current Outpatient Medications (Respiratory):  .  cetirizine (ZYRTEC) 10 MG tablet, Take 10 mg by mouth daily.  Current Outpatient Medications (Analgesics):  .  aspirin (ECOTRIN LOW STRENGTH) 81 MG EC tablet, Take 1 tablet daily   Current Outpatient Medications (Other):  Marland Kitchen  CHOLECALCIFEROL PO, Take 10,000 Units by mouth  daily. .  Magnesium 400 MG CAPS, Take 1 capsule by mouth 2 (two) times daily.  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  Screening Tests Immunization History  Administered Date(s) Administered  . Pneumococcal-Unspecified 05/05/2009, 03/03/2013  . Td 05/05/2009  . Tdap 06/21/2015  . Zoster 03/03/2013    Preventative care: Last colonoscopy: Dr Earlean Shawl, has been referred   Prior vaccinations: TD or Tdap: 2016  Influenza: DUE, declined.  Discussed with patient.  Pneumococcal: 2014 Prevnar13: Due, declined.  Discussed with patient Zostavax: 02/2013 Hep C ([birth year])-completed today Addendum: 09/26/19 Negative  Names of Other Physician/Practitioners you currently use: 1. [facility] here for primary care 2. Eye Exam  Diabetic 01/2019 3. 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 Family His family history includes Heart attack (age of onset: 87) in his father. Social history  He reports that he has never smoked. He has never used smokeless tobacco. He reports current alcohol use of about 1.0 standard drinks of alcohol per week. He reports that he does not use drugs.  MEDICARE WELLNESS OBJECTIVES: Physical activity: Current Exercise Habits: The patient does not participate in regular  exercise at present, Exercise limited by: neurologic condition(s);orthopedic condition(s) Cardiac risk factors: Cardiac Risk Factors include: advanced age (>109men, >26 women);diabetes mellitus;dyslipidemia;male gender;obesity (BMI >30kg/m2);sedentary lifestyle Depression/mood screen:   Depression screen [facility] 2/9 09/24/2019  Decreased Interest 0  Down, Depressed, Hopeless 0  PHQ - 2 Score 0    ADLs:  In your present state of health, do you have any difficulty performing the following activities: 09/24/2019 10/08/2018  Hearing? N N  Vision? N N  Difficulty concentrating or making decisions? N N  Walking or climbing stairs? N N  Dressing or bathing? N N  Doing errands, shopping? N N  Preparing Food and eating ? N -  Using the Toilet? N -  In the past six months, have you accidently leaked urine? N -  Do you have problems with loss of bowel control? N -  Managing your Medications? N -  Managing your Finances? N -  Housekeeping or managing your Housekeeping? N -  Some recent data might be hidden     Cognitive Testing  Alert? Yes  Normal Appearance?Yes  Oriented to person? Yes  Place? Yes   Time? Yes  Recall of three objects?  Yes  Can perform simple calculations? Yes  Displays appropriate judgment?Yes  Can read the correct time from a watch face?Yes  EOL planning: Does Patient Have a Medical Advance Directive?: Unable to assess, patient is non-responsive or altered mental status   Objective:   Today's Vitals   09/24/19 1128  BP: (!) 146/72  Pulse: 74  Temp: (!) 97 F (36.1 C)  SpO2: 98%  Weight: 264 lb 9.6 oz (120 kg)  Height: 5\' 11"  (1.803 m)  PainSc: 3   PainLoc: Back   Body mass index is 36.9 kg/m.  General appearance: alert, no distress, WD/WN, male HEENT: normocephalic, sclerae anicteric, TMs pearly, nares patent, no discharge or erythema, pharynx normal Oral cavity: MMM, no lesions Neck: supple, no lymphadenopathy, no thyromegaly, no masses Heart: RRR,  normal S1, S2, no murmurs Lungs: CTA bilaterally, no wheezes, rhonchi, or rales Abdomen: +bs, soft, non tender, non distended, no masses, no hepatomegaly, no splenomegaly Musculoskeletal: nontender, no swelling, no obvious deformity Extremities: no edema, no cyanosis, no clubbing Pulses: 2+ symmetric, upper and lower extremities, normal cap refill Neurological: alert, oriented x 3, CN2-12 intact, strength normal upper extremities and lower extremities, sensation impaired bilateral plantar surfaces, DTRs 2+ throughout, no cerebellar signs, gait normal Psychiatric: normal affect, behavior normal, pleasant   Medicare Attestation I have personally reviewed: The patient's medical and social history Their use of alcohol, tobacco or illicit drugs Their  current medications and supplements The patient's functional ability including ADLs,fall risks, home safety risks, cognitive, and hearing and visual impairment Diet and physical activities Evidence for depression or mood disorders  The patient's weight, height, BMI, and visual acuity have been recorded in the chart.  I have made referrals, counseling, and provided education to the patient based on review of the above and I have provided the patient with a written personalized care plan for preventive services.     Garnet Sierras, NP [facility] 09/24/2019  1:10 PM

## 2019-09-24 ENCOUNTER — Other Ambulatory Visit: Payer: Self-pay

## 2019-09-24 ENCOUNTER — Encounter: Payer: Self-pay | Admitting: Adult Health Nurse Practitioner

## 2019-09-24 ENCOUNTER — Ambulatory Visit: Payer: 59 | Admitting: Adult Health Nurse Practitioner

## 2019-09-24 VITALS — BP 146/72 | HR 74 | Temp 97.0°F | Ht 71.0 in | Wt 264.6 lb

## 2019-09-24 DIAGNOSIS — E559 Vitamin D deficiency, unspecified: Secondary | ICD-10-CM

## 2019-09-24 DIAGNOSIS — N182 Chronic kidney disease, stage 2 (mild): Secondary | ICD-10-CM

## 2019-09-24 DIAGNOSIS — R6889 Other general symptoms and signs: Secondary | ICD-10-CM | POA: Diagnosis not present

## 2019-09-24 DIAGNOSIS — Z1389 Encounter for screening for other disorder: Secondary | ICD-10-CM

## 2019-09-24 DIAGNOSIS — Z9189 Other specified personal risk factors, not elsewhere classified: Secondary | ICD-10-CM

## 2019-09-24 DIAGNOSIS — E785 Hyperlipidemia, unspecified: Secondary | ICD-10-CM

## 2019-09-24 DIAGNOSIS — Z23 Encounter for immunization: Secondary | ICD-10-CM

## 2019-09-24 DIAGNOSIS — I700 Atherosclerosis of aorta: Secondary | ICD-10-CM

## 2019-09-24 DIAGNOSIS — I10 Essential (primary) hypertension: Secondary | ICD-10-CM

## 2019-09-24 DIAGNOSIS — Z1321 Encounter for screening for nutritional disorder: Secondary | ICD-10-CM

## 2019-09-24 DIAGNOSIS — M1710 Unilateral primary osteoarthritis, unspecified knee: Secondary | ICD-10-CM

## 2019-09-24 DIAGNOSIS — E1169 Type 2 diabetes mellitus with other specified complication: Secondary | ICD-10-CM

## 2019-09-24 DIAGNOSIS — Z0001 Encounter for general adult medical examination with abnormal findings: Secondary | ICD-10-CM

## 2019-09-24 DIAGNOSIS — Z1159 Encounter for screening for other viral diseases: Secondary | ICD-10-CM

## 2019-09-24 DIAGNOSIS — E1122 Type 2 diabetes mellitus with diabetic chronic kidney disease: Secondary | ICD-10-CM

## 2019-09-24 DIAGNOSIS — E119 Type 2 diabetes mellitus without complications: Secondary | ICD-10-CM

## 2019-09-24 DIAGNOSIS — G4733 Obstructive sleep apnea (adult) (pediatric): Secondary | ICD-10-CM

## 2019-09-24 DIAGNOSIS — M179 Osteoarthritis of knee, unspecified: Secondary | ICD-10-CM

## 2019-09-24 DIAGNOSIS — Z6841 Body Mass Index (BMI) 40.0 and over, adult: Secondary | ICD-10-CM

## 2019-09-24 DIAGNOSIS — Z1329 Encounter for screening for other suspected endocrine disorder: Secondary | ICD-10-CM

## 2019-09-24 MED ORDER — GLIMEPIRIDE 4 MG PO TABS: 4.0000 mg | ORAL_TABLET | Freq: Two times a day (BID) | ORAL | 0 refills | Status: DC

## 2019-09-24 MED ORDER — METFORMIN HCL ER 500 MG PO TB24: ORAL_TABLET | ORAL | 0 refills | Status: DC

## 2019-09-24 MED ORDER — SIMVASTATIN 20 MG PO TABS: 20.0000 mg | ORAL_TABLET | Freq: Every day | ORAL | 0 refills | Status: DC

## 2019-09-25 LAB — COMPLETE METABOLIC PANEL WITH GFR
AG Ratio: 1.4 (calc) (ref 1.0–2.5)
ALT: 30 U/L (ref 9–46)
AST: 21 U/L (ref 10–35)
Albumin: 4.3 g/dL (ref 3.6–5.1)
Alkaline phosphatase (APISO): 46 U/L (ref 35–144)
BUN: 16 mg/dL (ref 7–25)
CO2: 26 mmol/L (ref 20–32)
Calcium: 9.6 mg/dL (ref 8.6–10.3)
Chloride: 101 mmol/L (ref 98–110)
Creat: 0.89 mg/dL (ref 0.70–1.18)
GFR, Est African American: 99 mL/min/{1.73_m2} (ref >=60)
GFR, Est Non African American: 85 mL/min/{1.73_m2} (ref >=60)
Globulin: 3.1 g/dL (calc) (ref 1.9–3.7)
Glucose, Bld: 181 mg/dL — ABNORMAL HIGH (ref 65–99)
Potassium: 4.8 mmol/L (ref 3.5–5.3)
Sodium: 134 mmol/L — ABNORMAL LOW (ref 135–146)
Total Bilirubin: 0.5 mg/dL (ref 0.2–1.2)
Total Protein: 7.4 g/dL (ref 6.1–8.1)

## 2019-09-25 LAB — CBC WITH DIFFERENTIAL/PLATELET
Absolute Monocytes: 602 cells/uL (ref 200–950)
Basophils Absolute: 49 cells/uL (ref 0–200)
Basophils Relative: 0.7 %
Eosinophils Absolute: 98 cells/uL (ref 15–500)
Eosinophils Relative: 1.4 %
HCT: 44.9 % (ref 38.5–50.0)
Hemoglobin: 15.5 g/dL (ref 13.2–17.1)
Lymphs Abs: 2156 cells/uL (ref 850–3900)
MCH: 30.8 pg (ref 27.0–33.0)
MCHC: 34.5 g/dL (ref 32.0–36.0)
MCV: 89.1 fL (ref 80.0–100.0)
MPV: 10 fL (ref 7.5–12.5)
Monocytes Relative: 8.6 %
Neutro Abs: 4095 cells/uL (ref 1500–7800)
Neutrophils Relative %: 58.5 %
Platelets: 296 10*3/uL (ref 140–400)
RBC: 5.04 10*6/uL (ref 4.20–5.80)
RDW: 12.7 % (ref 11.0–15.0)
Total Lymphocyte: 30.8 %
WBC: 7 10*3/uL (ref 3.8–10.8)

## 2019-09-25 LAB — URINE CULTURE
MICRO NUMBER:: 994818
SPECIMEN QUALITY:: ADEQUATE

## 2019-09-25 LAB — TSH: TSH: 2.14 mIU/L (ref 0.40–4.50)

## 2019-09-25 LAB — LIPID PANEL
Cholesterol: 154 mg/dL (ref <200)
HDL: 31 mg/dL — ABNORMAL LOW (ref >=40)
LDL Cholesterol (Calc): 91 mg/dL (calc)
Non-HDL Cholesterol (Calc): 123 mg/dL (calc) (ref <130)
Total CHOL/HDL Ratio: 5 (calc) — ABNORMAL HIGH (ref <5.0)
Triglycerides: 233 mg/dL — ABNORMAL HIGH (ref <150)

## 2019-09-25 LAB — HEPATITIS C ANTIBODY
Hepatitis C Ab: NONREACTIVE
SIGNAL TO CUT-OFF: 0.01 (ref <1.00)

## 2019-09-25 LAB — HEMOGLOBIN A1C
Hgb A1c MFr Bld: 9.1 % of total Hgb — ABNORMAL HIGH (ref <5.7)
Mean Plasma Glucose: 214 (calc)
eAG (mmol/L): 11.9 (calc)

## 2019-09-25 LAB — MAGNESIUM: Magnesium: 2.3 mg/dL (ref 1.5–2.5)

## 2019-09-25 LAB — VITAMIN D 25 HYDROXY (VIT D DEFICIENCY, FRACTURES): Vit D, 25-Hydroxy: 69 ng/mL (ref 30–100)

## 2019-09-25 LAB — INSULIN, RANDOM: Insulin: 27.8 u[IU]/mL — ABNORMAL HIGH

## 2019-10-10 ENCOUNTER — Other Ambulatory Visit: Payer: Self-pay | Admitting: Internal Medicine

## 2019-10-10 DIAGNOSIS — N182 Chronic kidney disease, stage 2 (mild): Secondary | ICD-10-CM

## 2019-10-10 DIAGNOSIS — E1122 Type 2 diabetes mellitus with diabetic chronic kidney disease: Secondary | ICD-10-CM

## 2019-10-27 ENCOUNTER — Other Ambulatory Visit: Payer: Self-pay | Admitting: Adult Health Nurse Practitioner

## 2019-10-27 DIAGNOSIS — I10 Essential (primary) hypertension: Secondary | ICD-10-CM

## 2019-10-27 MED ORDER — LOSARTAN POTASSIUM 100 MG PO TABS: 100.0000 mg | ORAL_TABLET | Freq: Every evening | ORAL | 1 refills | Status: DC

## 2019-12-15 ENCOUNTER — Other Ambulatory Visit: Payer: Self-pay | Admitting: *Deleted

## 2019-12-15 DIAGNOSIS — N182 Chronic kidney disease, stage 2 (mild): Secondary | ICD-10-CM

## 2019-12-15 DIAGNOSIS — E1122 Type 2 diabetes mellitus with diabetic chronic kidney disease: Secondary | ICD-10-CM

## 2019-12-15 DIAGNOSIS — E1169 Type 2 diabetes mellitus with other specified complication: Secondary | ICD-10-CM

## 2019-12-15 MED ORDER — METFORMIN HCL ER 500 MG PO TB24: ORAL_TABLET | ORAL | 0 refills | Status: DC

## 2019-12-15 MED ORDER — SIMVASTATIN 20 MG PO TABS: ORAL_TABLET | ORAL | 0 refills | Status: DC

## 2019-12-15 MED ORDER — GLIMEPIRIDE 4 MG PO TABS: ORAL_TABLET | ORAL | 0 refills | Status: DC

## 2019-12-24 ENCOUNTER — Encounter: Payer: Self-pay | Admitting: Internal Medicine

## 2019-12-24 LAB — HM COLONOSCOPY

## 2019-12-25 ENCOUNTER — Other Ambulatory Visit: Payer: Self-pay | Admitting: *Deleted

## 2019-12-25 DIAGNOSIS — E1122 Type 2 diabetes mellitus with diabetic chronic kidney disease: Secondary | ICD-10-CM

## 2019-12-25 MED ORDER — METFORMIN HCL ER 500 MG PO TB24: ORAL_TABLET | ORAL | 0 refills | Status: DC

## 2019-12-29 ENCOUNTER — Encounter: Payer: 59 | Admitting: Adult Health

## 2019-12-29 ENCOUNTER — Ambulatory Visit: Payer: 59 | Admitting: Adult Health Nurse Practitioner

## 2019-12-31 NOTE — Progress Notes (Signed)
Complete Physical  Assessment and Plan:  Joshua Koch was seen today for annual exam.  Diagnoses and all orders for this visit:  Encounter for routine adult health examination with abnormal findings Declines flu and pneumonia vaccine today   Benign essential HTN Continue medication Monitor blood pressure at home; call if consistently over 130/80 Continue DASH diet.   Reminder to go to the ER if any CP, SOB, nausea, dizziness, severe HA, changes vision/speech, left arm numbness and tingling and jaw pain. -     CBC with Differential/Platelet -     COMPLETE METABOLIC PANEL WITH GFR -     Magnesium -     TSH -     Microalbumin / creatinine urine ratio -     Urinalysis, Routine w reflex microscopic -     EKG 12-Lead  Aortic atherosclerosis ([place]) Per CT 2018 Control blood pressure, cholesterol, glucose, increase exercise.  -     Lipid panel  Type 2 diabetes mellitus with stage 2 chronic kidney disease, without long-term current use of insulin [facility]) Education: Reviewed 'ABCs' of diabetes management (respective goals in parentheses):  A1C (<7), blood pressure (<130/80), and cholesterol (LDL <70) Eye Exam yearly and Dental Exam every 6 months. Dietary recommendations Physical Activity recommendations Foot exam completed Plan to switch back to combo pioglitazone/metformin or add on pioglitazone on which he was well controlled for many years if A1C remains above 8% despite weight loss -     COMPLETE METABOLIC PANEL WITH GFR -     Hemoglobin A1c -     Microalbumin / creatinine urine ratio -     Urinalysis, Routine w reflex microscopic  Hyperlipidemia associated with type 2 diabetes mellitus (HCC) Continue statin; titrate for LDL goal <70 Continue low cholesterol diet and exercise.  Check lipid panel.  -     Lipid panel -     TSH  CKD stage 2 due to type 2 diabetes mellitus (HCC) Increase fluids, avoid NSAIDS, monitor sugars, will monitor -     COMPLETE METABOLIC PANEL WITH GFR -      Microalbumin / creatinine urine ratio -     Urinalysis, Routine w reflex microscopic  Diabetic mononeuropathy associated with type 2 diabetes mellitus ([place]) Declines medications at this time, discussed feet checks.  -     HM DIABETES FOOT EXAM  Osteoarthritis of knee, unspecified laterality, unspecified osteoarthritis type Tylenol PRN; establisehd with ortho to follow up if needed  Vitamin D deficiency At goal at recent check; continue to recommend supplementation for goal of 60-100 Defer vitamin D level  Morbid obesity - BMI 35+ with comorbidities (T2DM, thn, hyperlipidemia) Excellent progress over the last year with lifestyle modification;  Long discussion about weight loss, diet, and exercise Discussed ideal weight for height and initial weight goal (<245lb) Patient will work on portions Will follow up q41m  Heart murmur Monitor;   Screening for thyroid disorder -     TSH  Screening for prostate cancer No family history; denies LUTs -     PSA   Discussed med's effects and SE's. Screening labs and tests as requested with regular follow-up as recommended. Over 40 minutes of exam, counseling, chart review and critical decision making was performed  Future Appointments  Date Time Provider [place]  04/13/2020 10:45 AM Liane Comber, NP [facility] None  07/14/2020  9:30 AM Liane Comber, NP [facility] None  09/27/2020 11:15 AM Garnet Sierras, NP [facility] None  01/03/2021 10:00 AM Liane Comber, NP [facility] None  HPI 74 y.o. male patient presents for a complete physical. He has Type II diabetes mellitus with renal manifestations ([place]); Benign essential HTN; Hyperlipidemia associated with type 2 diabetes mellitus ([place]); Heart murmur; OA (osteoarthritis) of knee; Aortic atherosclerosis ([place]); Vitamin D deficiency; Morbid obesity ([place]); CKD stage 2 due to type 2 diabetes mellitus ([place]); Poor compliance; and Diabetic mononeuropathy associated with type 2 diabetes  mellitus ([place]) on their problem list.   He is retired from [employer], educational, married, 2 children, 2 grandchildren 51 and 5 y/o close by.   He has no concerns.   He has knee arthritis, had R TKA in 2018, left is mild, occasional tylenol. Has back pain, has done injections, considering surgery.   BMI is Body mass index is 35.29 kg/m., he has been working on diet, exercise limited due to back pain and knee. He has been prescribed phentermine/topamax in the past but stopped due to not liking how he felt with medications. Has been doing well reducing portions of starches, etc. Weight is down from 299 lb in 2019.  Drinks 2 cups of coffee daily.  He estimates 3-4 glasses water minimum, typically more Wt Readings from Last 3 Encounters:  01/04/20 253 lb (114.8 kg)  09/24/19 264 lb 9.6 oz (120 kg)  12/16/18 274 lb (124.3 kg)   He has aortic atherosclerosis per imaing on CT in 2018.  His blood pressure has been controlled at home, today their BP is BP: 124/60 He does not workout. He denies chest pain, shortness of breath, dizziness.   He is on cholesterol medication (simvastatin 20 mg daily) and denies myalgias. His cholesterol is not at goal of LDL <70. The cholesterol last visit was:   Lab Results  Component Value Date   CHOL 154 09/24/2019   HDL 31 (L) 09/24/2019   LDLCALC 91 09/24/2019   TRIG 233 (H) 09/24/2019   CHOLHDL 5.0 (H) 09/24/2019   He has been working on diet and exercise for T2 diabetes with CKD II (on metformin 2000 mg and amaryl 4 mg BID), he is on bASA, he is on ACE/ARB and denies increased appetite, nausea, polydipsia, polyuria, visual disturbances and vomiting. He does have neuropathy, intermittent. Has glucometer but doesn't check regularly. Last A1C in the office was:  Lab Results  Component Value Date   HGBA1C 9.1 (H) 09/24/2019   He has CKD II associated with T2DM and htn monitored at this office. Last GFR: Lab Results  Component Value Date   GFRNONAA 85 09/24/2019     Patient is on Vitamin D supplement.   Lab Results  Component Value Date   VD25OH 69 09/24/2019     Last PSA was: No results found for: PSA  Denies weak stream, straining, nocturia, dribbling.   Current Medications:  Current Outpatient Medications on File Prior to Visit  Medication Sig Dispense Refill  . APPLE CIDER VINEGAR PO Take by mouth daily.    Marland Kitchen aspirin (ECOTRIN LOW STRENGTH) 81 MG EC tablet Take 1 tablet daily    . cetirizine (ZYRTEC) 10 MG tablet Take 10 mg by mouth daily.    . CHOLECALCIFEROL PO Take 10,000 Units by mouth daily.    Marland Kitchen glimepiride (AMARYL) 4 MG tablet Take 1 tablet twice a day for diabetes. 180 tablet 0  . losartan (COZAAR) 100 MG tablet Take 1 tablet (100 mg total) by mouth every evening. 90 tablet 1  . Magnesium 400 MG CAPS Take 1 capsule by mouth 2 (two) times daily.    Marland Kitchen  Omega-3 Fatty Acids (FISH OIL PO) Take by mouth 2 (two) times daily.    . simvastatin (ZOCOR) 20 MG tablet Take 1 tablet at bedtime for cholesterol. 90 tablet 0  . Zinc 50 MG TABS Take by mouth daily.     No current facility-administered medications on file prior to visit.   Allergies:  No Known Allergies Health Maintenance:  Immunization History  Administered Date(s) Administered  . Pneumococcal-Unspecified 05/05/2009, 03/03/2013  . Td 05/05/2009  . Tdap 06/21/2015  . Zoster 03/03/2013   Preventative care: Last colonoscopy: Dr Earlean Shawl, 12/24/2019 due 2031  Prior vaccinations: TD or Tdap: 2016  Influenza: Declines  Pneumococcal: 2014 Prevnar13: Declines Zostavax: 02/2013  Names of Other Physician/Practitioners you currently use: 1. [facility] here for primary care 2. Eye Exam, Dr. Prudencio Burly,  Diabetic 01/2019, abstracted, has upcoming schedule 3. Dental Exam, Dr. ?, last in 2019, Due but postponing due to pandemic  4. Derm, Dr. Amy Martinique, last 2020, has scheduled this afternoon   Patient Care Team: Unk Pinto, MD as PCP -  General (Internal Medicine) Tisovec, Fransico Him, MD (Internal Medicine)  Medical History:  has Type II diabetes mellitus with renal manifestations ([place]); Benign essential HTN; Hyperlipidemia associated with type 2 diabetes mellitus ([place]); Heart murmur; OA (osteoarthritis) of knee; Aortic atherosclerosis ([place]); Vitamin D deficiency; Morbid obesity ([place]); CKD stage 2 due to type 2 diabetes mellitus ([place]); Poor compliance; and Diabetic mononeuropathy associated with type 2 diabetes mellitus ([place]) on their problem list. 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 Family History:  His family history includes CAD (age of onset: 14) in his brother; COPD in his brother; Heart attack (age of onset: 83) in his father; Lung cancer in his sister; Stroke in his brother. Social History:   reports that he has never smoked. He has never used smokeless tobacco. He reports current alcohol use of about 1.0 standard drinks of alcohol per week. He reports that he does not use drugs.   Review of Systems:  Review of Systems  Constitutional: Negative for malaise/fatigue and weight loss.  HENT: Negative for hearing loss and tinnitus.   Eyes: Negative for blurred vision and double vision.  Respiratory: Negative for cough, sputum production, shortness of breath and wheezing.   Cardiovascular: Negative for chest pain, palpitations, orthopnea, claudication, leg swelling and PND.  Gastrointestinal: Negative for abdominal pain, blood in stool, constipation, diarrhea, heartburn, melena, nausea and vomiting.  Genitourinary: Negative.   Musculoskeletal: Negative for falls, joint pain and myalgias.  Skin: Negative for rash.  Neurological: Positive for tingling (numnbness, tingling bil feet intermittently for many years). Negative for dizziness, sensory change, weakness and headaches.   Endo/Heme/Allergies: Negative for polydipsia.  Psychiatric/Behavioral: Negative.  Negative for depression, memory loss, substance abuse and suicidal ideas. The patient is not nervous/anxious and does not have insomnia.   All other systems reviewed and are negative.   Physical Exam: Estimated body mass index is 35.29 kg/m as calculated from the following:   Height as of this encounter: 5\' 11"  (1.803 m).   Weight as of this encounter: 253 lb (114.8 kg). BP 124/60   Pulse 95   Temp 97.7 F (36.5 C)   Ht 5\' 11"  (1.803 m)   Wt 253 lb (114.8 kg)   SpO2 97%   BMI 35.29 kg/m  General Appearance: Well nourished, in no apparent distress.  Eyes: PERRLA, EOMs,  conjunctiva no swelling or erythema, normal fundi and vessels.  Sinuses: No Frontal/maxillary tenderness  ENT/Mouth: Ext aud canals clear, normal light reflex with TMs without erythema, bulging. Good dentition. No erythema, swelling, or exudate on post pharynx. Tonsils not swollen or erythematous. Hearing normal.  Neck: Supple, thyroid normal. No bruits  Respiratory: Respiratory effort normal, BS equal bilaterally without rales, rhonchi, wheezing or stridor.  Cardio: RRR without murmurs, rubs or gallops. Brisk peripheral pulses without edema.  Chest: symmetric, with normal excursions and percussion.  Abdomen: Soft, nontender, no guarding, rebound, masses, or organomegaly. He has large ventral hernia without tenderness.  Lymphatics: Non tender without lymphadenopathy.  Genitourinary: Defer, no concerns Musculoskeletal: Full ROM all peripheral extremities, 5/5 strength, and normal gait.  Skin: Warm, dry without rashes, lesions, ecchymosis. Neuro: Cranial nerves intact, reflexes equal bilaterally. Normal muscle tone, no cerebellar symptoms. Sensation intact to light touch but not to monofilament bilaterally.  Psych: Awake and oriented X 3, normal affect, Insight and Judgment appropriate.   EKG: Sinus rhythm, left anterior fascicular  block, NSCPT  Gorden Harms Jayquan Bradsher 11:22 AM [facility]

## 2020-01-04 ENCOUNTER — Ambulatory Visit: Payer: 59 | Admitting: Adult Health

## 2020-01-04 ENCOUNTER — Encounter: Payer: Self-pay | Admitting: Adult Health

## 2020-01-04 ENCOUNTER — Other Ambulatory Visit: Payer: Self-pay

## 2020-01-04 VITALS — BP 124/60 | HR 95 | Temp 97.7°F | Ht 71.0 in | Wt 253.0 lb

## 2020-01-04 DIAGNOSIS — N182 Chronic kidney disease, stage 2 (mild): Secondary | ICD-10-CM

## 2020-01-04 DIAGNOSIS — R011 Cardiac murmur, unspecified: Secondary | ICD-10-CM

## 2020-01-04 DIAGNOSIS — E785 Hyperlipidemia, unspecified: Secondary | ICD-10-CM

## 2020-01-04 DIAGNOSIS — Z136 Encounter for screening for cardiovascular disorders: Secondary | ICD-10-CM

## 2020-01-04 DIAGNOSIS — E1122 Type 2 diabetes mellitus with diabetic chronic kidney disease: Secondary | ICD-10-CM

## 2020-01-04 DIAGNOSIS — I700 Atherosclerosis of aorta: Secondary | ICD-10-CM

## 2020-01-04 DIAGNOSIS — Z0000 Encounter for general adult medical examination without abnormal findings: Secondary | ICD-10-CM | POA: Diagnosis not present

## 2020-01-04 DIAGNOSIS — E559 Vitamin D deficiency, unspecified: Secondary | ICD-10-CM

## 2020-01-04 DIAGNOSIS — I10 Essential (primary) hypertension: Secondary | ICD-10-CM

## 2020-01-04 DIAGNOSIS — M179 Osteoarthritis of knee, unspecified: Secondary | ICD-10-CM

## 2020-01-04 DIAGNOSIS — E1141 Type 2 diabetes mellitus with diabetic mononeuropathy: Secondary | ICD-10-CM

## 2020-01-04 DIAGNOSIS — Z0001 Encounter for general adult medical examination with abnormal findings: Secondary | ICD-10-CM

## 2020-01-04 DIAGNOSIS — M1710 Unilateral primary osteoarthritis, unspecified knee: Secondary | ICD-10-CM

## 2020-01-04 DIAGNOSIS — E1169 Type 2 diabetes mellitus with other specified complication: Secondary | ICD-10-CM

## 2020-01-04 DIAGNOSIS — Z1329 Encounter for screening for other suspected endocrine disorder: Secondary | ICD-10-CM

## 2020-01-04 DIAGNOSIS — Z125 Encounter for screening for malignant neoplasm of prostate: Secondary | ICD-10-CM

## 2020-01-04 MED ORDER — METFORMIN HCL ER 500 MG PO TB24: ORAL_TABLET | ORAL | 3 refills | Status: DC

## 2020-01-04 NOTE — Patient Instructions (Addendum)
Joshua Koch , Thank you for taking time to come for your Annual Wellness Visit. I appreciate your ongoing commitment to your health goals. Please review the following plan we discussed and let me know if I can assist you in the future.   These are the goals we discussed: Goals    . DIET - INCREASE WATER INTAKE     65-80+ fluid ounces daily     . HEMOGLOBIN A1C < 7    . LDL CALC < 70    . Weight (lb) < 220 lb (99.8 kg)       This is a list of the screening recommended for you and due dates:  Health Maintenance  Topic Date Due  . Flu Shot  03/09/2020*  . Eye exam for diabetics  01/13/2020  . Hemoglobin A1C  03/24/2020  . Complete foot exam   01/03/2021  . Tetanus Vaccine  06/20/2025  . Colon Cancer Screening  12/23/2029  .  Hepatitis C: One time screening is recommended by Center for Disease Control  (CDC) for  adults born from [birth year] through 1964.   Completed  . Pneumonia vaccines  Discontinued  *Topic was postponed. The date shown is not the original due date.    Know what a healthy weight is for you (roughly BMI <25) and aim to maintain this  Aim for 7+ servings of fruits and vegetables daily  65-80+ fluid ounces of water or unsweet tea for healthy kidneys  Limit to max 1 drink of alcohol per day; avoid smoking/tobacco  Limit animal fats in diet for cholesterol and heart health - choose grass fed whenever available  Avoid highly processed foods, and foods high in saturated/trans fats  Aim for low stress - take time to unwind and care for your mental health  Aim for 150 min of moderate intensity exercise weekly for heart health, and weights twice weekly for bone health  Aim for 7-9 hours of sleep daily    Diabetes Mellitus and [place] care is an important part of your health, especially when you have diabetes. Diabetes may cause you to have problems because of poor blood flow (circulation) to your feet and legs, which can cause your skin to:  Become thinner and  drier.  Break more easily.  Heal more slowly.  Peel and crack. You may also have nerve damage (neuropathy) in your legs and feet, causing decreased feeling in them. This means that you may not notice minor injuries to your feet that could lead to more serious problems. Noticing and addressing any potential problems early is the best way to prevent future foot problems. How to care for your feet Foot hygiene  Wash your feet daily with warm water and mild soap. Do not use hot water. Then, pat your feet and the areas between your toes until they are completely dry. Do not soak your feet as this can dry your skin.  Trim your toenails straight across. Do not dig under them or around the cuticle. File the edges of your nails with an emery board or nail file.  Apply a moisturizing lotion or petroleum jelly to the skin on your feet and to dry, brittle toenails. Use lotion that does not contain alcohol and is unscented. Do not apply lotion between your toes. Shoes and socks  Wear clean socks or stockings every day. Make sure they are not too tight. Do not wear knee-high stockings since they may decrease blood flow to your legs.  Wear  shoes that fit properly and have enough cushioning. Always look in your shoes before you put them on to be sure there are no objects inside.  To break in new shoes, wear them for just a few hours a day. This prevents injuries on your feet. Wounds, scrapes, corns, and calluses  Check your feet daily for blisters, cuts, bruises, sores, and redness. If you cannot see the bottom of your feet, use a mirror or ask someone for help.  Do not cut corns or calluses or try to remove them with medicine.  If you find a minor scrape, cut, or break in the skin on your feet, keep it and the skin around it clean and dry. You may clean these areas with mild soap and water. Do not clean the area with peroxide, alcohol, or iodine.  If you have a wound, scrape, corn, or callus on your  foot, look at it several times a day to make sure it is healing and not infected. Check for: ? Redness, swelling, or pain. ? Fluid or blood. ? Warmth. ? Pus or a bad smell. General instructions  Do not cross your legs. This may decrease blood flow to your feet.  Do not use heating pads or hot water bottles on your feet. They may burn your skin. If you have lost feeling in your feet or legs, you may not know this is happening until it is too late.  Protect your feet from hot and cold by wearing shoes, such as at the beach or on hot pavement.  Schedule a complete foot exam at least once a year (annually) or more often if you have foot problems. If you have foot problems, report any cuts, sores, or bruises to your health care provider immediately. Contact a health care provider if:  You have a medical condition that increases your risk of infection and you have any cuts, sores, or bruises on your feet.  You have an injury that is not healing.  You have redness on your legs or feet.  You feel burning or tingling in your legs or feet.  You have pain or cramps in your legs and feet.  Your legs or feet are numb.  Your feet always feel cold.  You have pain around a toenail. Get help right away if:  You have a wound, scrape, corn, or callus on your foot and: ? You have pain, swelling, or redness that gets worse. ? You have fluid or blood coming from the wound, scrape, corn, or callus. ? Your wound, scrape, corn, or callus feels warm to the touch. ? You have pus or a bad smell coming from the wound, scrape, corn, or callus. ? You have a fever. ? You have a red line going up your leg. Summary  Check your feet every day for cuts, sores, red spots, swelling, and blisters.  Moisturize feet and legs daily.  Wear shoes that fit properly and have enough cushioning.  If you have foot problems, report any cuts, sores, or bruises to your health care provider immediately.  Schedule a  complete foot exam at least once a year (annually) or more often if you have foot problems. This information is not intended to replace advice given to you by your health care provider. Make sure you discuss any questions you have with your health care provider. Document Revised: 08/19/2019 Document Reviewed: 12/28/2016 Elsevier Patient Education  [place].

## 2020-01-05 LAB — COMPLETE METABOLIC PANEL WITH GFR
AG Ratio: 1.5 (calc) (ref 1.0–2.5)
ALT: 27 U/L (ref 9–46)
AST: 21 U/L (ref 10–35)
Albumin: 4.2 g/dL (ref 3.6–5.1)
Alkaline phosphatase (APISO): 46 U/L (ref 35–144)
BUN: 14 mg/dL (ref 7–25)
CO2: 26 mmol/L (ref 20–32)
Calcium: 9.5 mg/dL (ref 8.6–10.3)
Chloride: 104 mmol/L (ref 98–110)
Creat: 0.94 mg/dL (ref 0.70–1.18)
GFR, Est African American: 94 mL/min/{1.73_m2} (ref >=60)
GFR, Est Non African American: 81 mL/min/{1.73_m2} (ref >=60)
Globulin: 2.8 g/dL (calc) (ref 1.9–3.7)
Glucose, Bld: 143 mg/dL — ABNORMAL HIGH (ref 65–99)
Potassium: 5.1 mmol/L (ref 3.5–5.3)
Sodium: 138 mmol/L (ref 135–146)
Total Bilirubin: 0.4 mg/dL (ref 0.2–1.2)
Total Protein: 7 g/dL (ref 6.1–8.1)

## 2020-01-05 LAB — MICROALBUMIN / CREATININE URINE RATIO
Creatinine, Urine: 109 mg/dL (ref 20–320)
Microalb Creat Ratio: 10 mcg/mg creat (ref <30)
Microalb, Ur: 1.1 mg/dL

## 2020-01-05 LAB — CBC WITH DIFFERENTIAL/PLATELET
Absolute Monocytes: 624 cells/uL (ref 200–950)
Basophils Absolute: 70 cells/uL (ref 0–200)
Basophils Relative: 0.9 %
Eosinophils Absolute: 117 cells/uL (ref 15–500)
Eosinophils Relative: 1.5 %
HCT: 46.2 % (ref 38.5–50.0)
Hemoglobin: 15.5 g/dL (ref 13.2–17.1)
Lymphs Abs: 2192 cells/uL (ref 850–3900)
MCH: 29.8 pg (ref 27.0–33.0)
MCHC: 33.5 g/dL (ref 32.0–36.0)
MCV: 88.8 fL (ref 80.0–100.0)
MPV: 10.2 fL (ref 7.5–12.5)
Monocytes Relative: 8 %
Neutro Abs: 4797 cells/uL (ref 1500–7800)
Neutrophils Relative %: 61.5 %
Platelets: 293 10*3/uL (ref 140–400)
RBC: 5.2 10*6/uL (ref 4.20–5.80)
RDW: 12.2 % (ref 11.0–15.0)
Total Lymphocyte: 28.1 %
WBC: 7.8 10*3/uL (ref 3.8–10.8)

## 2020-01-05 LAB — PSA: PSA: 0.4 ng/mL (ref <=4.0)

## 2020-01-05 LAB — URINALYSIS, ROUTINE W REFLEX MICROSCOPIC
Bilirubin Urine: NEGATIVE
Glucose, UA: NEGATIVE
Hgb urine dipstick: NEGATIVE
Ketones, ur: NEGATIVE
Leukocytes,Ua: NEGATIVE
Nitrite: NEGATIVE
Protein, ur: NEGATIVE
Specific Gravity, Urine: 1.017 (ref 1.001–1.03)
pH: 5.5 (ref 5.0–8.0)

## 2020-01-05 LAB — HEMOGLOBIN A1C
Hgb A1c MFr Bld: 6.4 % of total Hgb — ABNORMAL HIGH (ref <5.7)
Mean Plasma Glucose: 137 (calc)
eAG (mmol/L): 7.6 (calc)

## 2020-01-05 LAB — LIPID PANEL
Cholesterol: 130 mg/dL (ref <200)
HDL: 31 mg/dL — ABNORMAL LOW (ref >=40)
LDL Cholesterol (Calc): 75 mg/dL (calc)
Non-HDL Cholesterol (Calc): 99 mg/dL (calc) (ref <130)
Total CHOL/HDL Ratio: 4.2 (calc) (ref <5.0)
Triglycerides: 153 mg/dL — ABNORMAL HIGH (ref <150)

## 2020-01-05 LAB — TSH: TSH: 4.23 mIU/L (ref 0.40–4.50)

## 2020-01-05 LAB — MAGNESIUM: Magnesium: 2.3 mg/dL (ref 1.5–2.5)

## 2020-01-14 LAB — HM DIABETES EYE EXAM

## 2020-01-20 ENCOUNTER — Encounter: Payer: Self-pay | Admitting: *Deleted

## 2020-01-23 ENCOUNTER — Other Ambulatory Visit: Payer: Self-pay | Admitting: Adult Health

## 2020-01-23 DIAGNOSIS — I10 Essential (primary) hypertension: Secondary | ICD-10-CM

## 2020-01-23 DIAGNOSIS — N182 Chronic kidney disease, stage 2 (mild): Secondary | ICD-10-CM

## 2020-01-23 DIAGNOSIS — E1122 Type 2 diabetes mellitus with diabetic chronic kidney disease: Secondary | ICD-10-CM

## 2020-01-23 MED ORDER — METFORMIN HCL ER 500 MG PO TB24: ORAL_TABLET | ORAL | 3 refills | Status: DC

## 2020-01-23 MED ORDER — LOSARTAN POTASSIUM 100 MG PO TABS: 100.0000 mg | ORAL_TABLET | Freq: Every evening | ORAL | 3 refills | Status: DC

## 2020-02-09 ENCOUNTER — Telehealth: Payer: Self-pay | Admitting: Adult Health

## 2020-02-09 NOTE — Telephone Encounter (Signed)
patient called to request referral to Dr Kristeen Miss for his back. States he spoke with you about his back at last office visit. Please advise

## 2020-02-10 ENCOUNTER — Other Ambulatory Visit: Payer: Self-pay | Admitting: Adult Health

## 2020-02-11 ENCOUNTER — Encounter: Payer: Self-pay | Admitting: Adult Health

## 2020-02-11 ENCOUNTER — Other Ambulatory Visit: Payer: Self-pay | Admitting: Adult Health

## 2020-02-11 DIAGNOSIS — M47816 Spondylosis without myelopathy or radiculopathy, lumbar region: Secondary | ICD-10-CM | POA: Insufficient documentation

## 2020-02-11 NOTE — Telephone Encounter (Signed)
Patient states he was seen 2019 at [facility]- Dr Nelva Bush, and Dr Tonita Cong- dx: Lumbar Spondylosis. Has received injections that did not help. He did have an MRI. He is requesting 2nd opinion from Dr Ellene Route. I will get the notes and MRI result for your review and recommendation.

## 2020-02-16 ENCOUNTER — Encounter: Payer: Self-pay | Admitting: Internal Medicine

## 2020-02-20 ENCOUNTER — Other Ambulatory Visit: Payer: Self-pay | Admitting: Internal Medicine

## 2020-02-20 DIAGNOSIS — E1169 Type 2 diabetes mellitus with other specified complication: Secondary | ICD-10-CM

## 2020-02-20 DIAGNOSIS — E1122 Type 2 diabetes mellitus with diabetic chronic kidney disease: Secondary | ICD-10-CM

## 2020-02-20 DIAGNOSIS — N182 Chronic kidney disease, stage 2 (mild): Secondary | ICD-10-CM

## 2020-03-03 ENCOUNTER — Encounter: Payer: Self-pay | Admitting: Internal Medicine

## 2020-03-16 DIAGNOSIS — M545 Low back pain: Secondary | ICD-10-CM | POA: Diagnosis not present

## 2020-03-16 DIAGNOSIS — M25559 Pain in unspecified hip: Secondary | ICD-10-CM | POA: Diagnosis not present

## 2020-03-24 DIAGNOSIS — M545 Low back pain: Secondary | ICD-10-CM | POA: Diagnosis not present

## 2020-03-29 DIAGNOSIS — M545 Low back pain: Secondary | ICD-10-CM | POA: Diagnosis not present

## 2020-03-31 DIAGNOSIS — M545 Low back pain: Secondary | ICD-10-CM | POA: Diagnosis not present

## 2020-04-05 DIAGNOSIS — M545 Low back pain: Secondary | ICD-10-CM | POA: Diagnosis not present

## 2020-04-07 DIAGNOSIS — M545 Low back pain: Secondary | ICD-10-CM | POA: Diagnosis not present

## 2020-04-12 ENCOUNTER — Encounter: Payer: Self-pay | Admitting: Adult Health

## 2020-04-12 NOTE — Progress Notes (Signed)
FOLLOW UP  Assessment and Plan:   Atherosclerosis of aorta Per CT 2018 Control blood pressure, cholesterol, glucose, increase exercise.   Hypertension Well controlled with current medications  Monitor blood pressure at home; patient to call if consistently greater than 130/80 Continue DASH diet.   Reminder to go to the ER if any CP, SOB, nausea, dizziness, severe HA, changes vision/speech, left arm numbness and tingling and jaw pain.  Hyperlipidemia associated with T2DM ([place]) Currently above goal; discussed LDL goal of <70; simvastatin 20 mg dialy, consider increase to 40 mg Continue low cholesterol diet and exercise.  Check lipid panel.   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  Perform daily foot/skin check, notify office of any concerning changes.  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 Further disposition pending results of labs. Discussed med's effects and SE's.   Over 30 minutes of exam, counseling, chart review, and critical decision making  was performed.   Future Appointments  Date Time Provider [place]  07/14/2020  9:30 AM Liane Comber, NP [facility] None  09/27/2020 11:30 AM Garnet Sierras, NP [facility] None  01/03/2021 10:00 AM Liane Comber, NP [facility] None    ----------------------------------------------------------------------------------------------------------------------  HPI 74 y.o. male  presents for 3 month follow up on hypertension, cholesterol, diabetes with CKD and neuropathy, weight and vitamin D deficiency.   He has chronic lumbar pain with intermittent bilateral anterior thigh numbness, has been referred to Dr. Ellene Route, doing PT, taking celebrex PRN.   BMI is Body mass index is 35.51 kg/m., he he has been working on diet, exercise limited due to back pain and knee.  He is using stretch/resistance bands, doing nearly daily. He has been prescribed phentermine/topamax in the past but stopped due to not liking how he felt with medications. Has been doing well reducing portions of starches, potato chips, fries, etc. Weight is down from 299 lb in 2019.  Drinks 2 cups of coffee daily.  He estimates 3-4 glasses water minimum, typically more He has been cutting down on sweets, admits loves food, meat and potatoes   Wt Readings from Last 3 Encounters:  04/13/20 254 lb 9.6 oz (115.5 kg)  01/04/20 253 lb (114.8 kg)  09/24/19 264 lb 9.6 oz (120 kg)   He has aortic atherosclerosis per imaing on CT in 2018.  His blood pressure has been controlled at home, today their BP is BP: (!) 126/54  He does workout. He denies chest pain, shortness of breath, dizziness.   He is on cholesterol medication Simvastatin 20 mg daily and denies myalgias. His cholesterol is not at goal of LDL <70. The cholesterol last visit was:   Lab  Results  Component Value Date   CHOL 130 01/04/2020   HDL 31 (L) 01/04/2020   LDLCALC 75 01/04/2020   TRIG 153 (H) 01/04/2020   CHOLHDL 4.2 01/04/2020   He has been working on  diet and exercise for T2 diabetes with CKD II (on metformin 2000 mg and amaryl 4 mg BID), he is on bASA, he is on ACE/ARB and denies increased appetite, nausea, polydipsia, polyuria, visual disturbances and vomiting. He does have neuropathy, intermittent. Has glucometer but doesn't check regularly. Last A1C in the office was:  Lab Results  Component Value Date   HGBA1C 6.4 (H) 01/04/2020    He has CKD II associated with T2DM monitored at this office:  Lab Results  Component Value Date   GFRNONAA 81 01/04/2020   Patient is on Vitamin D supplement.   Lab Results  Component Value Date   VD25OH 69 09/24/2019        Current Medications:  Current Outpatient Medications on File Prior to Visit  Medication Sig  . APPLE CIDER VINEGAR PO Take by mouth daily.  . Ascorbic Acid (VITAMIN C PO) Take by mouth. 1 tablet daily  . aspirin (ECOTRIN LOW STRENGTH) 81 MG EC tablet Take 1 tablet daily  . Celecoxib (CELEBREX PO) Take 1 capsule by mouth daily as needed. Daily PRN back pain  . cetirizine (ZYRTEC) 10 MG tablet Take 10 mg by mouth daily.  . CHOLECALCIFEROL PO Take 10,000 Units by mouth daily.  Marland Kitchen glimepiride (AMARYL) 4 MG tablet TAKE 1 TABLET BY MOUTH  TWICE DAILY FOR DIABETES  . losartan (COZAAR) 100 MG tablet Take 1 tablet (100 mg total) by mouth every evening.  . Magnesium 400 MG CAPS Take 1 capsule by mouth 2 (two) times daily.  . metFORMIN (GLUCOPHAGE-XR) 500 MG 24 hr tablet Take 2 tablets 2 x /day with meals for Diabetes (E11.29)  . Omega-3 Fatty Acids (FISH OIL PO) Take by mouth 2 (two) times daily.  . simvastatin (ZOCOR) 20 MG tablet TAKE 1 TABLET BY MOUTH AT  BEDTIME FOR CHOLESTEROL  . Zinc 50 MG TABS Take by mouth daily.   No current facility-administered medications on file prior to visit.     Allergies: No Known Allergies   Medical History:  Past Medical History:  Diagnosis Date  . Arthritis   . Asthma    childhood  . 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 Diabetes  mellitus   . Diabetes mellitus type 2 with neurological manifestations ([place]) 10/04/2014  . Dyspnea   . Erectile dysfunction   . GERD (gastroesophageal reflux disease)   . Headache   . Heart murmur   . History of kidney stones   . Hypercholesteremia   . Hyperlipemia   . Hypertension   . Kidney stone   . Low back pain   . Obesity   . Rotator cuff dysfunction    partial tear , left shoulder  . Sleep apnea    Family history- Reviewed and unchanged Social history- Reviewed and unchanged   Review of Systems:  Review of Systems  Constitutional: Negative for malaise/fatigue and weight loss.  HENT: Negative for hearing loss and tinnitus.   Eyes: Negative for blurred vision and double vision.  Respiratory: Negative for cough, shortness of breath and wheezing.   Cardiovascular: Negative for chest pain, palpitations, orthopnea, claudication and leg swelling.  Gastrointestinal: Negative for abdominal pain, blood in stool, constipation, diarrhea, heartburn, melena, nausea and vomiting.  Genitourinary: Negative.  Musculoskeletal: Positive for back pain (chronic, lumbar, seeing Dr. Ellene Route). Negative for joint pain and myalgias.  Skin: Negative for rash.  Neurological: Negative for dizziness, tingling, sensory change, weakness and headaches.  Endo/Heme/Allergies: Negative for polydipsia.  Psychiatric/Behavioral: Negative.   All other systems reviewed and are negative.     Physical Exam: BP (!) 126/54   Pulse 90   Temp (!) 97 F (36.1 C)   Resp 16   Ht 5\' 11"  (1.803 m)   Wt 254 lb 9.6 oz (115.5 kg)   SpO2 97%   BMI 35.51 kg/m  Wt Readings from Last 3 Encounters:  04/13/20 254 lb 9.6 oz (115.5 kg)  01/04/20 253 lb (114.8 kg)  09/24/19 264 lb 9.6 oz (120 kg)   General Appearance: Well nourished, obese male in no apparent distress. Eyes: PERRLA, EOMs, conjunctiva no swelling or erythema Sinuses: No Frontal/maxillary tenderness ENT/Mouth: Ext aud canals clear, TMs without  erythema, bulging. No erythema, swelling, or exudate on post pharynx.  Tonsils not swollen or erythematous. Hearing normal.  Neck: Supple, thyroid normal.  Respiratory: Respiratory effort normal, BS equal bilaterally without rales, rhonchi, wheezing or stridor.  Cardio: RRR with no MRGs. Brisk peripheral pulses without edema.  Abdomen: Soft, obese abdomen, + BS.  Non tender, no guarding, rebound, hernias, masses. Lymphatics: Non tender without lymphadenopathy.  Musculoskeletal: Full ROM, 5/5 strength, mildly antalgic gait, neg straight leg raise.  Skin: Warm, dry without rashes, lesions, ecchymosis.  Neuro: Cranial nerves intact. No cerebellar symptoms.  Psych: Awake and oriented X 3, normal affect, Insight and Judgment appropriate.    Izora Ribas, NP 10:50 AM [facility]

## 2020-04-13 ENCOUNTER — Encounter: Payer: Self-pay | Admitting: Adult Health

## 2020-04-13 ENCOUNTER — Ambulatory Visit: Payer: 59 | Admitting: Adult Health

## 2020-04-13 ENCOUNTER — Other Ambulatory Visit: Payer: Self-pay

## 2020-04-13 VITALS — BP 126/54 | HR 90 | Temp 97.0°F | Resp 16 | Ht 71.0 in | Wt 254.6 lb

## 2020-04-13 DIAGNOSIS — E559 Vitamin D deficiency, unspecified: Secondary | ICD-10-CM

## 2020-04-13 DIAGNOSIS — I700 Atherosclerosis of aorta: Secondary | ICD-10-CM

## 2020-04-13 DIAGNOSIS — N182 Chronic kidney disease, stage 2 (mild): Secondary | ICD-10-CM | POA: Diagnosis not present

## 2020-04-13 DIAGNOSIS — E1169 Type 2 diabetes mellitus with other specified complication: Secondary | ICD-10-CM

## 2020-04-13 DIAGNOSIS — E1141 Type 2 diabetes mellitus with diabetic mononeuropathy: Secondary | ICD-10-CM

## 2020-04-13 DIAGNOSIS — E785 Hyperlipidemia, unspecified: Secondary | ICD-10-CM | POA: Diagnosis not present

## 2020-04-13 DIAGNOSIS — E1122 Type 2 diabetes mellitus with diabetic chronic kidney disease: Secondary | ICD-10-CM | POA: Diagnosis not present

## 2020-04-13 DIAGNOSIS — I10 Essential (primary) hypertension: Secondary | ICD-10-CM | POA: Diagnosis not present

## 2020-04-13 NOTE — Patient Instructions (Addendum)
Goals    . DIET - INCREASE WATER INTAKE     65-80+ fluid ounces daily     . HEMOGLOBIN A1C < 7    . LDL CALC < 70    . Weight (lb) < 220 lb (99.8 kg)       Please message covid 19 vaccine date and lot numbers so we can document   Check weight weekly - aim to lose 0.5-2 lb/week  Focus on increasing whole, high fiber plant based foods     Drink water before meal, start with veggies, whole grains, beans, end meal with starch or protein  Try eating off of a smaller plate  Take your time eating, try to chew each bite 20+ times, set fork down in between bites  Aim to eat until 80% full, not 100% or "stuffed"  Aim to make small sustainable changes  A great goal to work towards is aiming to get in a serving daily of some of the most nutritionally dense foods - G- BOMBS daily, ideally 7+ servings of 1/2 cup each of fruits and veggies          High-Fiber Diet Fiber, also called dietary fiber, is a type of carbohydrate that is found in fruits, vegetables, whole grains, and beans. A high-fiber diet can have many health benefits. Your health care provider may recommend a high-fiber diet to help:  Prevent constipation. Fiber can make your bowel movements more regular.  Lower your cholesterol.  Relieve the following conditions: ? Swelling of veins in the anus (hemorrhoids). ? Swelling and irritation (inflammation) of specific areas of the digestive tract (uncomplicated diverticulosis). ? A problem of the large intestine (colon) that sometimes causes pain and diarrhea (irritable bowel syndrome, IBS).  Prevent overeating as part of a weight-loss plan.  Prevent heart disease, type 2 diabetes, and certain cancers. What is my plan? The recommended daily fiber intake in grams (g) includes:  38 g for men age 4 or younger.  30 g for men over age 32.  37 g for women age 17 or younger.  21 g for women over age 76. You can get the recommended daily intake of dietary fiber by:   Eating a variety of fruits, vegetables, grains, and beans.  Taking a fiber supplement, if it is not possible to get enough fiber through your diet. What do I need to know about a high-fiber diet?  It is better to get fiber through food sources rather than from fiber supplements. There is not a lot of research about how effective supplements are.  Always check the fiber content on the nutrition facts label of any prepackaged food. Look for foods that contain 5 g of fiber or more per serving.  Talk with a diet and nutrition specialist (dietitian) if you have questions about specific foods that are recommended or not recommended for your medical condition, especially if those foods are not listed below.  Gradually increase how much fiber you consume. If you increase your intake of dietary fiber too quickly, you may have bloating, cramping, or gas.  Drink plenty of water. Water helps you to digest fiber. What are tips for following this plan?  Eat a wide variety of high-fiber foods.  Make sure that half of the grains that you eat each day are whole grains.  Eat breads and cereals that are made with whole-grain flour instead of refined flour or white flour.  Eat brown rice, bulgur wheat, or millet instead of white rice.  Start the day with a breakfast that is high in fiber, such as a cereal that contains 5 g of fiber or more per serving.  Use beans in place of meat in soups, salads, and pasta dishes.  Eat high-fiber snacks, such as berries, raw vegetables, nuts, and popcorn.  Choose whole fruits and vegetables instead of processed forms like juice or sauce. What foods can I eat?  Fruits Berries. Pears. Apples. Oranges. Avocado. Prunes and raisins. Dried figs. Vegetables Sweet potatoes. Spinach. Kale. Artichokes. Cabbage. Broccoli. Cauliflower. Green peas. Carrots. Squash. Grains Whole-grain breads. Multigrain cereal. Oats and oatmeal. Brown rice. Barley. Bulgur wheat. [place].  Quinoa. Bran muffins. Popcorn. Rye wafer crackers. Meats and other proteins Navy, kidney, and pinto beans. Soybeans. Split peas. Lentils. Nuts and seeds. Dairy Fiber-fortified yogurt. Beverages Fiber-fortified soy milk. Fiber-fortified orange juice. Other foods Fiber bars. The items listed above may not be a complete list of recommended foods and beverages. Contact a dietitian for more options. What foods are not recommended? Fruits Fruit juice. Cooked, strained fruit. Vegetables Fried potatoes. Canned vegetables. Well-cooked vegetables. Grains White bread. Pasta made with refined flour. White rice. Meats and other proteins Fatty cuts of meat. Fried chicken or fried fish. Dairy Milk. Yogurt. Cream cheese. Sour cream. Fats and oils Butters. Beverages Soft drinks. Other foods Cakes and pastries. The items listed above may not be a complete list of foods and beverages to avoid. Contact a dietitian for more information. Summary  Fiber is a type of carbohydrate. It is found in fruits, vegetables, whole grains, and beans.  There are many health benefits of eating a high-fiber diet, such as preventing constipation, lowering blood cholesterol, helping with weight loss, and reducing your risk of heart disease, diabetes, and certain cancers.  Gradually increase your intake of fiber. Increasing too fast can result in cramping, bloating, and gas. Drink plenty of water while you increase your fiber.  The best sources of fiber include whole fruits and vegetables, whole grains, nuts, seeds, and beans. This information is not intended to replace advice given to you by your health care provider. Make sure you discuss any questions you have with your health care provider. Document Revised: 09/30/2017 Document Reviewed: 09/30/2017 Elsevier Patient Education  2020 [employer].

## 2020-04-14 ENCOUNTER — Other Ambulatory Visit: Payer: Self-pay | Admitting: Adult Health

## 2020-04-14 DIAGNOSIS — N182 Chronic kidney disease, stage 2 (mild): Secondary | ICD-10-CM

## 2020-04-14 LAB — COMPLETE METABOLIC PANEL WITH GFR
AG Ratio: 1.5 (calc) (ref 1.0–2.5)
ALT: 21 U/L (ref 9–46)
AST: 18 U/L (ref 10–35)
Albumin: 4.1 g/dL (ref 3.6–5.1)
Alkaline phosphatase (APISO): 41 U/L (ref 35–144)
BUN: 20 mg/dL (ref 7–25)
CO2: 25 mmol/L (ref 20–32)
Calcium: 9.5 mg/dL (ref 8.6–10.3)
Chloride: 106 mmol/L (ref 98–110)
Creat: 0.94 mg/dL (ref 0.70–1.18)
GFR, Est African American: 94 mL/min/{1.73_m2} (ref >=60)
GFR, Est Non African American: 81 mL/min/{1.73_m2} (ref >=60)
Globulin: 2.7 g/dL (calc) (ref 1.9–3.7)
Glucose, Bld: 122 mg/dL — ABNORMAL HIGH (ref 65–99)
Potassium: 5.3 mmol/L (ref 3.5–5.3)
Sodium: 139 mmol/L (ref 135–146)
Total Bilirubin: 0.4 mg/dL (ref 0.2–1.2)
Total Protein: 6.8 g/dL (ref 6.1–8.1)

## 2020-04-14 LAB — LIPID PANEL
Cholesterol: 123 mg/dL (ref <200)
HDL: 35 mg/dL — ABNORMAL LOW (ref >=40)
LDL Cholesterol (Calc): 70 mg/dL (calc)
Non-HDL Cholesterol (Calc): 88 mg/dL (calc) (ref <130)
Total CHOL/HDL Ratio: 3.5 (calc) (ref <5.0)
Triglycerides: 93 mg/dL (ref <150)

## 2020-04-14 LAB — CBC WITH DIFFERENTIAL/PLATELET
Absolute Monocytes: 614 cells/uL (ref 200–950)
Basophils Absolute: 74 cells/uL (ref 0–200)
Basophils Relative: 1 %
Eosinophils Absolute: 148 cells/uL (ref 15–500)
Eosinophils Relative: 2 %
HCT: 44.7 % (ref 38.5–50.0)
Hemoglobin: 15 g/dL (ref 13.2–17.1)
Lymphs Abs: 2435 cells/uL (ref 850–3900)
MCH: 30.1 pg (ref 27.0–33.0)
MCHC: 33.6 g/dL (ref 32.0–36.0)
MCV: 89.6 fL (ref 80.0–100.0)
MPV: 10.2 fL (ref 7.5–12.5)
Monocytes Relative: 8.3 %
Neutro Abs: 4129 cells/uL (ref 1500–7800)
Neutrophils Relative %: 55.8 %
Platelets: 277 10*3/uL (ref 140–400)
RBC: 4.99 10*6/uL (ref 4.20–5.80)
RDW: 12.5 % (ref 11.0–15.0)
Total Lymphocyte: 32.9 %
WBC: 7.4 10*3/uL (ref 3.8–10.8)

## 2020-04-14 LAB — HEMOGLOBIN A1C
Hgb A1c MFr Bld: 5.6 % of total Hgb (ref <5.7)
Mean Plasma Glucose: 114 (calc)
eAG (mmol/L): 6.3 (calc)

## 2020-04-14 LAB — MAGNESIUM: Magnesium: 2.3 mg/dL (ref 1.5–2.5)

## 2020-04-14 LAB — TSH: TSH: 3.11 mIU/L (ref 0.40–4.50)

## 2020-04-14 MED ORDER — GLIMEPIRIDE 2 MG PO TABS: ORAL_TABLET | ORAL | 3 refills | Status: DC

## 2020-05-03 ENCOUNTER — Other Ambulatory Visit: Payer: Self-pay | Admitting: Internal Medicine

## 2020-05-03 DIAGNOSIS — N182 Chronic kidney disease, stage 2 (mild): Secondary | ICD-10-CM

## 2020-05-03 MED ORDER — METFORMIN HCL ER 500 MG PO TB24: ORAL_TABLET | ORAL | 0 refills | Status: DC

## 2020-07-04 ENCOUNTER — Other Ambulatory Visit: Payer: Self-pay | Admitting: Internal Medicine

## 2020-07-04 DIAGNOSIS — E1122 Type 2 diabetes mellitus with diabetic chronic kidney disease: Secondary | ICD-10-CM

## 2020-07-13 NOTE — Progress Notes (Signed)
FOLLOW UP  Assessment and Plan:   Atherosclerosis of aorta Per CT 2018 Control blood pressure, cholesterol, glucose, increase exercise.   Hypertension Mildly above goal today; historically well controlled; defer med change for now, but consider addition of HCTZ Monitor blood pressure at home; patient to call if consistently greater than 130/80 Continue DASH diet.   Reminder to go to the ER if any CP, SOB, nausea, dizziness, severe HA, changes vision/speech, left arm numbness and tingling and jaw pain.  Hyperlipidemia associated with T2DM ([place]) Currently at goal; LDL goal of <70;  Continue low cholesterol diet and exercise.  Check lipid panel.   Diabetes with diabetic chronic kidney disease ([place]) Continue medication: metformin, amaryl - encouraged to check glucose more regularly, risk of hypoglycemia, consider reducing PM amaryl if A1C further improved  Continue diet and exercise.  Perform daily foot/skin check, notify office of any concerning changes.  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 Long discussion about weight loss, diet, and exercise Recommended diet heavy in fruits and veggies and low in animal meats, cheeses, and dairy products, appropriate calorie intake Discussed ideal weight for height and weight goal (220lb) D/c topamax/phentermine due to SE Patient will work on reducing processed foods, starches, increase plant based high fiber foods Sit down with wife and come up with lifestyle goals and plan for once she retires in a few weeks - great opportunity to build some new habits Will follow up in 3 months  Vitamin D Def At goal at last visit;  continue supplementation to maintain goal of 60-100 Defer Vit D level  Continue diet and meds as discussed. Further  disposition pending results of labs. Discussed med's effects and SE's.   Over 30 minutes of exam, counseling, chart review, and critical decision making was performed.   Future Appointments  Date Time Provider [place]  09/27/2020 11:30 AM Garnet Sierras, NP [facility] None  01/03/2021 10:00 AM Liane Comber, NP [facility] None    ----------------------------------------------------------------------------------------------------------------------  HPI 74 y.o. male  presents for 3 month follow up on hypertension, cholesterol, diabetes with CKD and neuropathy, weight and vitamin D deficiency.   He has chronic lumbar pain with intermittent bilateral anterior thigh numbness, has been referred to Dr. Ellene Route, did PT but this made it worse, quit after 6 sessions, doing home exercises, taking celebrex PRN.   BMI is Body mass index is 35.57 kg/m., he he has been working on diet, exercise limited due to back pain and knee.  He is using stretch/resistance bands, doing nearly daily. He has been prescribed phentermine/topamax in the past but stopped due to not liking how he felt with medications. Has been doing well reducing portions of starches, potato chips, fries, etc. Doing portion control with meals, and wife will be retiring soon and plans Weight is down from 299 lb in 2019.  Drinks 2 cups of coffee daily.  He estimates 3-4 glasses water minimum, typically more He has been cutting down on sweets, admits loves food, meat and potatoes   Wt Readings from Last 3 Encounters:  07/14/20 255 lb (115.7 kg)  04/13/20 254 lb 9.6 oz (115.5 kg)  01/04/20 253 lb (114.8 kg)   He has aortic atherosclerosis per imaing on CT in 2018.  His blood pressure has been controlled at home, today their BP is BP: 138/68  He does workout. He denies chest pain, shortness of breath, dizziness.   He is on cholesterol medication Simvastatin 20 mg daily and denies myalgias. His cholesterol is at goal of LDL  <70. The cholesterol last visit was:   Lab Results  Component Value Date   CHOL 123 04/13/2020   HDL 35 (L) 04/13/2020   LDLCALC 70 04/13/2020   TRIG 93 04/13/2020   CHOLHDL 3.5 04/13/2020   He has been working on diet and exercise for T2 diabetes with CKD II (on metformin 2000 mg and amaryl 2 mg BID), he is on bASA, he is on ACE/ARB and denies increased appetite, nausea, polydipsia, polyuria, visual disturbances and vomiting.  He does have neuropathy, intermittent.  Discussed ozempic or trulicity - would consider but declines for now Has glucometer but doesn't check regularly.  Last A1C in the office was:  Lab Results  Component Value Date   HGBA1C 5.6 04/13/2020    He has CKD II associated with T2DM monitored at this office:  Lab Results  Component Value Date   GFRNONAA 81 04/13/2020   Patient is on Vitamin D supplement.   Lab Results  Component Value Date   VD25OH 69 09/24/2019        Current Medications:  Current Outpatient Medications on File Prior to Visit  Medication Sig  . APPLE CIDER VINEGAR PO Take by mouth daily.  . Ascorbic Acid (VITAMIN C PO) Take by mouth. 1 tablet daily  . aspirin (ECOTRIN LOW STRENGTH) 81 MG EC tablet Take 1 tablet daily  . Celecoxib (CELEBREX PO) Take 1 capsule by mouth daily as needed. Daily PRN back pain  . cetirizine (ZYRTEC) 10 MG tablet Take 10 mg by mouth daily.  . CHOLECALCIFEROL PO Take 10,000 Units by mouth daily.  Marland Kitchen glimepiride (AMARYL) 2 MG tablet TAKE 1 TABLET BY MOUTH  TWICE DAILY FOR DIABETES  . losartan (COZAAR) 100 MG tablet Take 1 tablet (100 mg total) by mouth every evening.  . Magnesium 400 MG CAPS Take 1 capsule by mouth 2 (two) times daily.  . metFORMIN (GLUCOPHAGE-XR) 500 MG 24 hr tablet TAKE 2 TABLETS BY MOUTH 2  TIMES PER DAY WITH MEALS  FOR DIABETES  . Omega-3 Fatty Acids (FISH OIL PO) Take by mouth 2 (two) times daily.  . simvastatin (ZOCOR) 20 MG tablet TAKE 1 TABLET BY MOUTH AT  BEDTIME FOR CHOLESTEROL  . Zinc  50 MG TABS Take by mouth daily.   No current facility-administered medications on file prior to visit.     Allergies: No Known Allergies   Medical History:  Past Medical History:  Diagnosis Date  . Arthritis   . Asthma    childhood  . Cancer ([place])    basal cell face and back  . Diabetes mellitus   . Diabetes mellitus type 2 with neurological manifestations ([place]) 10/04/2014  . Dyspnea   . Erectile dysfunction   . GERD (gastroesophageal reflux disease)   . Headache   . Heart murmur   . History of kidney stones   . Hypercholesteremia   . Hyperlipemia   . Hypertension   . Kidney stone   . Low back pain   . Obesity   . Rotator cuff dysfunction    partial tear , left shoulder  . Sleep apnea    Family history- Reviewed and unchanged Social history- Reviewed and unchanged   Review of Systems:  Review of Systems  Constitutional: Negative for malaise/fatigue and weight loss.  HENT: Negative for  hearing loss and tinnitus.   Eyes: Negative for blurred vision and double vision.  Respiratory: Negative for cough, shortness of breath and wheezing.   Cardiovascular: Negative for chest pain, palpitations, orthopnea, claudication and leg swelling.  Gastrointestinal: Negative for abdominal pain, blood in stool, constipation, diarrhea, heartburn, melena, nausea and vomiting.  Genitourinary: Negative.   Musculoskeletal: Positive for back pain (chronic, lumbar, seeing Dr. Ellene Route). Negative for joint pain and myalgias.  Skin: Negative for rash.  Neurological: Negative for dizziness, tingling, sensory change, weakness and headaches.  Endo/Heme/Allergies: Negative for polydipsia.  Psychiatric/Behavioral: Negative.   All other systems reviewed and are negative.     Physical Exam: BP 138/68   Pulse 72   Temp (!) 97.5 F (36.4 C)   Ht 5\' 11"  (1.803 m)   Wt 255 lb (115.7 kg)   SpO2 99%   BMI 35.57 kg/m  Wt Readings from Last 3 Encounters:  07/14/20 255 lb (115.7 kg)  04/13/20  254 lb 9.6 oz (115.5 kg)  01/04/20 253 lb (114.8 kg)   General Appearance: Well nourished, obese male in no apparent distress. Eyes: PERRLA, EOMs, conjunctiva no swelling or erythema Sinuses: No Frontal/maxillary tenderness ENT/Mouth: Ext aud canals clear, TMs without erythema, bulging. No erythema, swelling, or exudate on post pharynx.  Tonsils not swollen or erythematous. Hearing normal.  Neck: Supple, thyroid normal.  Respiratory: Respiratory effort normal, BS equal bilaterally without rales, rhonchi, wheezing or stridor.  Cardio: RRR with no MRGs. Brisk peripheral pulses without edema.  Abdomen: Soft, obese abdomen, + BS.  Non tender, no guarding, rebound, hernias, masses. Lymphatics: Non tender without lymphadenopathy.  Musculoskeletal: Full ROM, 5/5 strength, mildly antalgic gait, neg straight leg raise.  Skin: Warm, dry without rashes, lesions, ecchymosis.  Neuro: Cranial nerves intact. No cerebellar symptoms.  Psych: Awake and oriented X 3, normal affect, Insight and Judgment appropriate.    Izora Ribas, NP 9:39 AM [facility]

## 2020-07-14 ENCOUNTER — Encounter: Payer: Self-pay | Admitting: Adult Health

## 2020-07-14 ENCOUNTER — Ambulatory Visit (INDEPENDENT_AMBULATORY_CARE_PROVIDER_SITE_OTHER): Payer: Medicare Other | Admitting: Adult Health

## 2020-07-14 ENCOUNTER — Other Ambulatory Visit: Payer: Self-pay

## 2020-07-14 VITALS — BP 138/68 | HR 72 | Temp 97.5°F | Ht 71.0 in | Wt 255.0 lb

## 2020-07-14 DIAGNOSIS — E785 Hyperlipidemia, unspecified: Secondary | ICD-10-CM | POA: Diagnosis not present

## 2020-07-14 DIAGNOSIS — E1122 Type 2 diabetes mellitus with diabetic chronic kidney disease: Secondary | ICD-10-CM | POA: Diagnosis not present

## 2020-07-14 DIAGNOSIS — E1141 Type 2 diabetes mellitus with diabetic mononeuropathy: Secondary | ICD-10-CM | POA: Diagnosis not present

## 2020-07-14 DIAGNOSIS — I10 Essential (primary) hypertension: Secondary | ICD-10-CM

## 2020-07-14 DIAGNOSIS — I700 Atherosclerosis of aorta: Secondary | ICD-10-CM

## 2020-07-14 DIAGNOSIS — E1169 Type 2 diabetes mellitus with other specified complication: Secondary | ICD-10-CM

## 2020-07-14 DIAGNOSIS — E559 Vitamin D deficiency, unspecified: Secondary | ICD-10-CM

## 2020-07-14 DIAGNOSIS — N182 Chronic kidney disease, stage 2 (mild): Secondary | ICD-10-CM | POA: Diagnosis not present

## 2020-07-14 NOTE — Patient Instructions (Addendum)
Goals    . DIET - INCREASE WATER INTAKE     65-80+ fluid ounces daily     . HEMOGLOBIN A1C < 7    . LDL CALC < 70    . Weight (lb) < 220 lb (99.8 kg)       Recommend weighing once a week and keeping a log  Spend some time writing down different food items that you eat on a frequent basis  Grade foods in order of importance to you, and if there are some foods that aren't as important to you (you mentioned sweets), set a goal for reducing the frequency  Foods that are very important to you such as meats and potatoes, focus on just slightly reducing portions, and try to always pair with a high fiber veggie  Ideally, 2/3rds of your plate should be plant based, and no more than 1/3 should be animal protein based on recent research  Just writing down what you eat can help you be more conscious of your intake; doesn't have to be any dramatic change to see progress  Recommend sitting down with wife and coming up with health goals for the upcoming year after she retires and a plan to help move towards this       When it comes to diets, agreement about the perfect plan isn't easy to find, even among the experts. Experts at the [employer] developed an idea known as the Healthy Eating Plate. Just imagine a plate divided into logical, healthy portions.  The emphasis is on diet quality:  Load up on vegetables and fruits - one-half of your plate: Aim for color and variety, and remember that potatoes don't count.  Go for whole grains - one-quarter of your plate: Whole wheat, barley, wheat berries, quinoa, oats, brown rice, and foods made with them. If you want pasta, go with whole wheat pasta.  Protein power - one-quarter of your plate: Fish, chicken, beans, and nuts are all healthy, versatile protein sources. Limit red meat.  The diet, however, does go beyond the plate, offering a few other suggestions.  Use healthy plant oils, such as olive, canola, soy, corn,  sunflower and peanut. Check the labels, and avoid partially hydrogenated oil, which have unhealthy trans fats.  If you're thirsty, drink water. Coffee and tea are good in moderation, but skip sugary drinks and limit milk and dairy products to one or two daily servings.  The type of carbohydrate in the diet is more important than the amount. Some sources of carbohydrates, such as vegetables, fruits, whole grains, and beans--are healthier than others.  Finally, stay active.

## 2020-07-15 LAB — CBC WITH DIFFERENTIAL/PLATELET
Absolute Monocytes: 665 cells/uL (ref 200–950)
Basophils Absolute: 63 cells/uL (ref 0–200)
Basophils Relative: 0.9 %
Eosinophils Absolute: 112 cells/uL (ref 15–500)
Eosinophils Relative: 1.6 %
HCT: 43.6 % (ref 38.5–50.0)
Hemoglobin: 14.6 g/dL (ref 13.2–17.1)
Lymphs Abs: 2268 cells/uL (ref 850–3900)
MCH: 30.1 pg (ref 27.0–33.0)
MCHC: 33.5 g/dL (ref 32.0–36.0)
MCV: 89.9 fL (ref 80.0–100.0)
MPV: 10.4 fL (ref 7.5–12.5)
Monocytes Relative: 9.5 %
Neutro Abs: 3892 cells/uL (ref 1500–7800)
Neutrophils Relative %: 55.6 %
Platelets: 276 10*3/uL (ref 140–400)
RBC: 4.85 10*6/uL (ref 4.20–5.80)
RDW: 12.4 % (ref 11.0–15.0)
Total Lymphocyte: 32.4 %
WBC: 7 10*3/uL (ref 3.8–10.8)

## 2020-07-15 LAB — COMPLETE METABOLIC PANEL WITH GFR
AG Ratio: 1.7 (calc) (ref 1.0–2.5)
ALT: 27 U/L (ref 9–46)
AST: 24 U/L (ref 10–35)
Albumin: 4.3 g/dL (ref 3.6–5.1)
Alkaline phosphatase (APISO): 41 U/L (ref 35–144)
BUN: 18 mg/dL (ref 7–25)
CO2: 26 mmol/L (ref 20–32)
Calcium: 9.4 mg/dL (ref 8.6–10.3)
Chloride: 105 mmol/L (ref 98–110)
Creat: 0.83 mg/dL (ref 0.70–1.18)
GFR, Est African American: 102 mL/min/{1.73_m2} (ref >=60)
GFR, Est Non African American: 88 mL/min/{1.73_m2} (ref >=60)
Globulin: 2.5 g/dL (calc) (ref 1.9–3.7)
Glucose, Bld: 123 mg/dL — ABNORMAL HIGH (ref 65–99)
Potassium: 5 mmol/L (ref 3.5–5.3)
Sodium: 138 mmol/L (ref 135–146)
Total Bilirubin: 0.5 mg/dL (ref 0.2–1.2)
Total Protein: 6.8 g/dL (ref 6.1–8.1)

## 2020-07-15 LAB — LIPID PANEL
Cholesterol: 125 mg/dL (ref <200)
HDL: 32 mg/dL — ABNORMAL LOW (ref >=40)
LDL Cholesterol (Calc): 70 mg/dL (calc)
Non-HDL Cholesterol (Calc): 93 mg/dL (calc) (ref <130)
Total CHOL/HDL Ratio: 3.9 (calc) (ref <5.0)
Triglycerides: 155 mg/dL — ABNORMAL HIGH (ref <150)

## 2020-07-15 LAB — HEMOGLOBIN A1C
Hgb A1c MFr Bld: 6.2 % of total Hgb — ABNORMAL HIGH (ref <5.7)
Mean Plasma Glucose: 131 (calc)
eAG (mmol/L): 7.3 (calc)

## 2020-07-15 LAB — MAGNESIUM: Magnesium: 2.1 mg/dL (ref 1.5–2.5)

## 2020-07-15 LAB — TSH: TSH: 2.6 mIU/L (ref 0.40–4.50)

## 2020-08-03 DIAGNOSIS — I739 Peripheral vascular disease, unspecified: Secondary | ICD-10-CM | POA: Diagnosis not present

## 2020-08-16 ENCOUNTER — Encounter (HOSPITAL_COMMUNITY): Payer: 59

## 2020-08-16 ENCOUNTER — Other Ambulatory Visit (HOSPITAL_COMMUNITY): Payer: Self-pay | Admitting: Neurological Surgery

## 2020-08-16 ENCOUNTER — Ambulatory Visit (HOSPITAL_COMMUNITY)
Admission: RE | Admit: 2020-08-16 | Discharge: 2020-08-16 | Disposition: A | Discharge status: Home / Self Care | Payer: Medicare Other | Source: Ambulatory Visit | Attending: Neurological Surgery | Admitting: Neurological Surgery

## 2020-08-16 ENCOUNTER — Other Ambulatory Visit: Payer: Self-pay

## 2020-08-16 DIAGNOSIS — I739 Peripheral vascular disease, unspecified: Secondary | ICD-10-CM

## 2020-08-22 DIAGNOSIS — M47816 Spondylosis without myelopathy or radiculopathy, lumbar region: Secondary | ICD-10-CM | POA: Diagnosis not present

## 2020-09-14 ENCOUNTER — Other Ambulatory Visit: Payer: Self-pay | Admitting: Internal Medicine

## 2020-09-14 DIAGNOSIS — N182 Chronic kidney disease, stage 2 (mild): Secondary | ICD-10-CM

## 2020-09-14 DIAGNOSIS — E1122 Type 2 diabetes mellitus with diabetic chronic kidney disease: Secondary | ICD-10-CM

## 2020-09-14 MED ORDER — GLIMEPIRIDE 2 MG PO TABS: ORAL_TABLET | ORAL | 0 refills | Status: DC

## 2020-09-15 ENCOUNTER — Other Ambulatory Visit: Payer: Self-pay | Admitting: *Deleted

## 2020-09-15 DIAGNOSIS — E1122 Type 2 diabetes mellitus with diabetic chronic kidney disease: Secondary | ICD-10-CM

## 2020-09-15 MED ORDER — GLIMEPIRIDE 2 MG PO TABS: ORAL_TABLET | ORAL | 0 refills | Status: DC

## 2020-09-27 ENCOUNTER — Ambulatory Visit: Payer: 59 | Admitting: Adult Health Nurse Practitioner

## 2020-10-18 NOTE — Progress Notes (Signed)
MEDICARE ANNUAL WELLNESS VISIT AND FOLLOW UP Assessment:    Encounter for annual medicare wellness visit Declines flu and pneumonia vaccine today   Benign essential HTN Continue medication Monitor blood pressure at home; call if consistently over 130/80 Continue DASH diet.   Reminder to go to the ER if any CP, SOB, nausea, dizziness, severe HA, changes vision/speech, left arm numbness and tingling and jaw pain. -     CBC with Differential/Platelet -     COMPLETE METABOLIC PANEL WITH GFR -     Magnesium  Aortic atherosclerosis ([place]) Per CT 2018 Control blood pressure, cholesterol, glucose, increase exercise.  -     Lipid panel  Type 2 diabetes mellitus with stage 2 chronic kidney disease, without long-term current use of insulin [facility]) Education: Reviewed 'ABCs' of diabetes management (respective goals in parentheses):  A1C (<7), blood pressure (<130/80), and cholesterol (LDL <70) Eye Exam yearly and Dental Exam every 6 months. Dietary recommendations Physical Activity recommendations -     COMPLETE METABOLIC PANEL WITH GFR -     Hemoglobin A1c  Hyperlipidemia associated with type 2 diabetes mellitus ([place]) Continue statin; titrate for LDL goal <70 Continue low cholesterol diet and exercise.  Check lipid panel.  -     Lipid panel -     TSH  CKD stage 2 due to type 2 diabetes mellitus (HCC) Increase fluids, avoid NSAIDS, monitor sugars, will monitor -     COMPLETE METABOLIC PANEL WITH GFR  Diabetic mononeuropathy associated with type 2 diabetes mellitus ([place]) Declines medications at this time, discussed feet checks.   Osteoarthritis of knee, unspecified laterality, unspecified osteoarthritis type Celebrex, tylenol PRN; establisehd with ortho to follow up if needed  Vitamin D deficiency At goal at recent check; continue to recommend supplementation for goal of 60-100 Defer vitamin D level  Morbid obesity - BMI 35+ with comorbidities (T2DM, thn, hyperlipidemia) Excellent  progress over the last year with lifestyle modification;  Long discussion about weight loss, diet, and exercise Discussed ideal weight for height and initial weight goal (<245lb) Patient will work on portions, discussed ozempic which he will consider Discussed mindful eating strategies  Will follow up q68m  Heart murmur Monitor;    Over 30 minutes of exam, counseling, chart review, and critical decision making was performed  Future Appointments  Date Time Provider [place]  01/19/2021 10:00 AM Liane Comber, NP [facility] None  10/19/2021 11:00 AM Liane Comber, NP [facility] None     Plan:   During the course of the visit the patient was educated and counseled about appropriate screening and preventive services including:    Pneumococcal vaccine   Influenza vaccine  Prevnar 13  Td vaccine  Screening electrocardiogram  Colorectal cancer screening  Diabetes screening  Glaucoma screening  Nutrition counseling    Subjective:  Joshua Koch is a 74 y.o. male who presents for Medicare Annual Wellness Visit and 3 month follow up for HTN, hyperlipidemia, T2DM with CKD, and vitamin D Def.   He has chronic lumbar pain with intermittent bilateral anterior thigh numbness, has been referred to Dr. Ellene Route, did PT but this made it worse, quit after 6 sessions, doing home exercises, taking celebrex 200 mg.   BMI is Body mass index is 35.43 kg/m., he he has been working on diet, exercise limited due to back pain and knee.  He is using stretch/resistance bands, not as much, busy caring for 2 yards He was prescribed phentermine/topamax in the past but stopped due to  not liking how he felt with medications.  Weight is down from 299 lb in 2019.  He has been cutting down on sweets, admits loves food, meat and potatoes  - "I think I have a food addiction." Eats for flavor, not for hunger -  Wt Readings from Last 3 Encounters:  10/19/20 254 lb (115.2 kg)  07/14/20 255 lb  (115.7 kg)  04/13/20 254 lb 9.6 oz (115.5 kg)   His blood pressure has been controlled at home, today their BP is BP: 122/64 He does not workout. He denies chest pain, shortness of breath, dizziness.   He has aortic atherosclerosis per Ct 2018  He is on cholesterol medication and denies myalgias. His cholesterol is at goal. The cholesterol last visit was:   Lab Results  Component Value Date   CHOL 125 07/14/2020   HDL 32 (L) 07/14/2020   LDLCALC 70 07/14/2020   TRIG 155 (H) 07/14/2020   CHOLHDL 3.9 07/14/2020   He has been working on diet and exercise for T2 diabetes with CKD II (on metformin 2000 mg and amaryl 1 mg BID), he is on bASA, he is on ACE/ARB and denies increased appetite, nausea, polydipsia, polyuria, visual disturbances and vomiting.  He does have neuropathy, intermittent.  Normal ABI 08/2020 Discussed ozempic or trulicity - would consider but declines for now Has glucometer but doesn't check regularly.  Last A1C in the office was:  Lab Results  Component Value Date   HGBA1C 6.2 (H) 07/14/2020   Last GFR Lab Results  Component Value Date   GFRNONAA 88 07/14/2020    Patient is on Vitamin D supplement.   Lab Results  Component Value Date   VD25OH 69 09/24/2019      Medication Review:  Current Outpatient Medications (Endocrine & Metabolic):  .  glimepiride (AMARYL) 2 MG tablet, Take     1 tablet        2 x /day       with Meals for Diabetes .  metFORMIN (GLUCOPHAGE-XR) 500 MG 24 hr tablet, TAKE 2 TABLETS BY MOUTH 2  TIMES PER DAY WITH MEALS  FOR DIABETES  Current Outpatient Medications (Cardiovascular):  .  losartan (COZAAR) 100 MG tablet, Take 1 tablet (100 mg total) by mouth every evening. .  simvastatin (ZOCOR) 20 MG tablet, TAKE 1 TABLET BY MOUTH AT  BEDTIME FOR CHOLESTEROL  Current Outpatient Medications (Respiratory):  .  cetirizine (ZYRTEC) 10 MG tablet, Take 10 mg by mouth daily.  Current Outpatient Medications (Analgesics):  .  aspirin (ECOTRIN  LOW STRENGTH) 81 MG EC tablet, Take 1 tablet daily .  Celecoxib (CELEBREX PO), Take 200 mg by mouth daily as needed. Daily PRN back pain   Current Outpatient Medications (Other):  Marland Kitchen  APPLE CIDER VINEGAR PO, Take by mouth daily. .  Ascorbic Acid (VITAMIN C PO), Take by mouth. 1 tablet daily .  CHOLECALCIFEROL PO, Take 10,000 Units by mouth daily. .  Magnesium 400 MG CAPS, Take 1 capsule by mouth 2 (two) times daily. .  Omega-3 Fatty Acids (FISH OIL PO), Take by mouth 2 (two) times daily. .  Zinc 50 MG TABS, Take by mouth daily.  Allergies: No Known Allergies  Current Problems (verified) has Type II diabetes mellitus with renal manifestations ([place]); Benign essential HTN; Hyperlipidemia associated with type 2 diabetes mellitus ([place]); Heart murmur; OA (osteoarthritis) of knee; Aortic atherosclerosis ([place]); Vitamin D deficiency; Morbid obesity ([place]); CKD stage 2 due to type 2 diabetes mellitus ([place]); Poor compliance; Diabetic  mononeuropathy associated with type 2 diabetes mellitus ([place]); and Lumbar spondylosis on their problem list.  Screening Tests Immunization History  Administered Date(s) Administered  . PFIZER SARS-COV-2 Vaccination 01/01/2020, 01/22/2020  . Pneumococcal-Unspecified 05/05/2009, 03/03/2013  . Td 05/05/2009  . Tdap 06/21/2015  . Zoster 03/03/2013    Preventative care: Last colonoscopy: Dr Earlean Shawl, 12/24/2019 due 2031  Prior vaccinations: TD or Tdap: 2016  Influenza: Declines  Pneumococcal: 2014 Prevnar13: Declines Zostavax: 02/2013 Covid 19: 2/2, 2021, pfizer   Names of Other Physician/Practitioners you currently use: 1. [facility] here for primary care 2. Eye Exam, Dr. Prudencio Burly,  Diabetic 01/14/2020, abstracted, has upcoming schedule 3. Dental Exam, Dr. ?, last in 2019, Due but postponing due to pandemic  4. Derm, Dr. 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 Family His family history includes CAD (age of onset: 52) in his brother; COPD in his brother; Heart attack (age of onset: 5) in his father; Lung cancer in his sister; Stroke in his brother. Social history  He reports that he has never smoked. He has never used smokeless tobacco. He reports current alcohol use of about 1.0 standard drink of alcohol per week. He reports that he does not use drugs.  MEDICARE WELLNESS OBJECTIVES: Physical activity: Current Exercise Habits: The patient does not participate in regular exercise at present, Exercise limited by: orthopedic condition(s) (back and knee) Cardiac risk factors: Cardiac Risk Factors include: advanced age (>67men, >65 women);dyslipidemia;hypertension;obesity (BMI >30kg/m2);male gender;diabetes mellitus;sedentary lifestyle Depression/mood screen:   Depression screen [facility] 2/9 01/04/2020  Decreased Interest 0  Down, Depressed, Hopeless 0  PHQ - 2 Score 0    ADLs:  In your present state of health, do you have any difficulty performing the following activities: 10/19/2020  Hearing? Y  Vision? N  Difficulty concentrating or making decisions? N  Walking or climbing stairs? N  Comment can manage slowly  Dressing or bathing? N  Doing errands, shopping? N  Some recent data might be hidden     Cognitive Testing  Alert? Yes  Normal Appearance?Yes  Oriented to person? Yes  Place? Yes   Time? Yes  Recall of three objects?  Yes  Can perform simple calculations? Yes  Displays appropriate judgment?Yes  Can read the correct time from a watch face?Yes  EOL planning: Does Patient Have a Medical Advance Directive?: No Would patient like information on creating a medical advance directive?: Yes  (MAU/Ambulatory/Procedural Areas - Information given)   Objective:   Today's Vitals   10/19/20 0858  BP: 122/64  Pulse: 67  Temp: (!) 97.5 F (36.4 C)  SpO2: 98%  Weight: 254 lb (115.2 kg)   Body mass index is 35.43 kg/m.  General appearance: alert, no distress, WD/WN, male HEENT: normocephalic, sclerae anicteric, TMs pearly, nares patent, no discharge or erythema, pharynx normal Oral cavity: MMM, no lesions Neck: supple, no lymphadenopathy, no thyromegaly, no masses Heart: RRR, normal S1, S2, no murmurs Lungs: CTA bilaterally, no wheezes, rhonchi, or rales Abdomen: +bs, soft, obese, non tender, non distended, no masses, no hepatomegaly, no splenomegaly Musculoskeletal: nontender, no swelling, no obvious deformity Extremities: no edema, no cyanosis, no clubbing Pulses: 2+ symmetric, upper and lower extremities, normal cap refill Neurological: alert, oriented x 3,  CN2-12 intact, strength normal upper extremities and lower extremities, sensation normal throughout, DTRs 2+ throughout, no cerebellar signs, gait normal Psychiatric: normal affect, behavior normal, pleasant   Medicare Attestation I have personally reviewed: The patient's medical and social history Their use of alcohol, tobacco or illicit drugs Their current medications and supplements The patient's functional ability including ADLs,fall risks, home safety risks, cognitive, and hearing and visual impairment Diet and physical activities Evidence for depression or mood disorders  The patient's weight, height, BMI, and visual acuity have been recorded in the chart.  I have made referrals, counseling, and provided education to the patient based on review of the above and I have provided the patient with a written personalized care plan for preventive services.     Izora Ribas, NP   10/19/2020

## 2020-10-19 ENCOUNTER — Ambulatory Visit (INDEPENDENT_AMBULATORY_CARE_PROVIDER_SITE_OTHER): Payer: Medicare Other | Admitting: Adult Health

## 2020-10-19 ENCOUNTER — Encounter: Payer: Self-pay | Admitting: Adult Health

## 2020-10-19 ENCOUNTER — Other Ambulatory Visit: Payer: Self-pay

## 2020-10-19 VITALS — BP 122/64 | HR 67 | Temp 97.5°F | Wt 254.0 lb

## 2020-10-19 DIAGNOSIS — M179 Osteoarthritis of knee, unspecified: Secondary | ICD-10-CM

## 2020-10-19 DIAGNOSIS — M1710 Unilateral primary osteoarthritis, unspecified knee: Secondary | ICD-10-CM

## 2020-10-19 DIAGNOSIS — Z0001 Encounter for general adult medical examination with abnormal findings: Secondary | ICD-10-CM | POA: Diagnosis not present

## 2020-10-19 DIAGNOSIS — R011 Cardiac murmur, unspecified: Secondary | ICD-10-CM

## 2020-10-19 DIAGNOSIS — E785 Hyperlipidemia, unspecified: Secondary | ICD-10-CM | POA: Diagnosis not present

## 2020-10-19 DIAGNOSIS — E559 Vitamin D deficiency, unspecified: Secondary | ICD-10-CM

## 2020-10-19 DIAGNOSIS — E1169 Type 2 diabetes mellitus with other specified complication: Secondary | ICD-10-CM | POA: Diagnosis not present

## 2020-10-19 DIAGNOSIS — N182 Chronic kidney disease, stage 2 (mild): Secondary | ICD-10-CM

## 2020-10-19 DIAGNOSIS — R6889 Other general symptoms and signs: Secondary | ICD-10-CM

## 2020-10-19 DIAGNOSIS — I700 Atherosclerosis of aorta: Secondary | ICD-10-CM

## 2020-10-19 DIAGNOSIS — Z0000 Encounter for general adult medical examination without abnormal findings: Secondary | ICD-10-CM

## 2020-10-19 DIAGNOSIS — E1141 Type 2 diabetes mellitus with diabetic mononeuropathy: Secondary | ICD-10-CM

## 2020-10-19 DIAGNOSIS — Z9119 Patient's noncompliance with other medical treatment and regimen: Secondary | ICD-10-CM

## 2020-10-19 DIAGNOSIS — I10 Essential (primary) hypertension: Secondary | ICD-10-CM

## 2020-10-19 DIAGNOSIS — E1122 Type 2 diabetes mellitus with diabetic chronic kidney disease: Secondary | ICD-10-CM

## 2020-10-19 DIAGNOSIS — Z724 Inappropriate diet and eating habits: Secondary | ICD-10-CM

## 2020-10-19 MED ORDER — GLIMEPIRIDE 1 MG PO TABS: ORAL_TABLET | ORAL | 1 refills | Status: DC

## 2020-10-19 NOTE — Patient Instructions (Addendum)
Joshua Koch , Thank you for taking time to come for your Medicare Wellness Visit. I appreciate your ongoing commitment to your health goals. Please review the following plan we discussed and let me know if I can assist you in the future.   These are the goals we discussed: Goals    . DIET - INCREASE WATER INTAKE     65-80+ fluid ounces daily     . HEMOGLOBIN A1C < 7    . LDL CALC < 70    . Weight (lb) < 220 lb (99.8 kg)       This is a list of the screening recommended for you and due dates:  Health Maintenance  Topic Date Due  . Flu Shot  Never done  . Complete foot exam   01/03/2021  . Eye exam for diabetics  01/13/2021  . Hemoglobin A1C  01/14/2021  . Tetanus Vaccine  06/20/2025  . Colon Cancer Screening  12/23/2029  . COVID-19 Vaccine  Completed  .  Hepatitis C: One time screening is recommended by Center for Disease Control  (CDC) for  adults born from [birth year] through 1964.   Completed  . Pneumonia vaccines  Discontinued    PLEASE START WEIGHING AND KEEPING LOG ONCE WEEKLY    [employer] Free Hearing Test with no obligation # [phone] Do not have to be a member Tues-Sat 10-6  [place]- free test with no obligation # [phone] MUST BE A MEMBER Call for store hours  Have had patient's get good cheaper hearing aids from [facility] The air version has good reviews.      General weight loss tips    Drink 1/2 your body weight in fluid ounces of water daily; drink a tall glass of water 30 min before meals  Before eating, ask yourself if you are hungry, bored, stressed or even just thirsty   Always eat at the table; avoid eating while distracted  Don't eat until you're stuffed- eat slowly, listen to your stomach and eat until you are 80% full   Aim for each meal or snack to have fiber + protein + healthy fats = satisfying   Try eating off of a salad plate; wait 10 min after finishing before going back for seconds  Start by eating the  vegetables on your plate; aim for 50% of your meals to be fruits and vegetables  Then eat your protein - lean meats (grass fed if possible), fish, beans, nuts/seeds in moderation  Eat your carbs/starch last ONLY if you still are hungry, and choose high fiber options as much as possible (brown rice, oats, quinoa, farro, etc). If you get full before finishing, don't feel bad about leaving some on your plate  Avoid sugar, flour, processed - the closer it looks to it's original form in nature, typically the better it is for you, and will avoid triggering a glucose spike that will store your calories as fat and make you more hungry  Splurge in moderation if needed - "assign" meals when you get to splurge and have the "bad stuff" - I like to follow a 80% - 20% plan- "healthy" choices 80 % of the time, "splurge" choices in moderation 20% of the time  Simple equation is: Calories out > calories in = weight loss - even if you eat the bad stuff, if you limit portions, you will still lose weight, just not as fast  Make sustainable changes - this is a journey to  build lifelong healthy habits! A healthy change should help you feel energized, focused and light on your feet. If you make a change and you feel miserable doing it, please ask for help :)      High-Fiber Diet Fiber, also called dietary fiber, is a type of carbohydrate that is found in fruits, vegetables, whole grains, and beans. A high-fiber diet can have many health benefits. Your health care provider may recommend a high-fiber diet to help:  Prevent constipation. Fiber can make your bowel movements more regular.  Lower your cholesterol.  Relieve the following conditions: ? Swelling of veins in the anus (hemorrhoids). ? Swelling and irritation (inflammation) of specific areas of the digestive tract (uncomplicated diverticulosis). ? A problem of the large intestine (colon) that sometimes causes pain and diarrhea (irritable bowel syndrome,  IBS).  Prevent overeating as part of a weight-loss plan.  Prevent heart disease, type 2 diabetes, and certain cancers. What is my plan? The recommended daily fiber intake in grams (g) includes:  38 g for men age 30 or younger.  30 g for men over age 33.  72 g for women age 67 or younger.  21 g for women over age 18. You can get the recommended daily intake of dietary fiber by:  Eating a variety of fruits, vegetables, grains, and beans.  Taking a fiber supplement, if it is not possible to get enough fiber through your diet. What do I need to know about a high-fiber diet?  It is better to get fiber through food sources rather than from fiber supplements. There is not a lot of research about how effective supplements are.  Always check the fiber content on the nutrition facts label of any prepackaged food. Look for foods that contain 5 g of fiber or more per serving.  Talk with a diet and nutrition specialist (dietitian) if you have questions about specific foods that are recommended or not recommended for your medical condition, especially if those foods are not listed below.  Gradually increase how much fiber you consume. If you increase your intake of dietary fiber too quickly, you may have bloating, cramping, or gas.  Drink plenty of water. Water helps you to digest fiber. What are tips for following this plan?  Eat a wide variety of high-fiber foods.  Make sure that half of the grains that you eat each day are whole grains.  Eat breads and cereals that are made with whole-grain flour instead of refined flour or white flour.  Eat brown rice, bulgur wheat, or millet instead of white rice.  Start the day with a breakfast that is high in fiber, such as a cereal that contains 5 g of fiber or more per serving.  Use beans in place of meat in soups, salads, and pasta dishes.  Eat high-fiber snacks, such as berries, raw vegetables, nuts, and popcorn.  Choose whole fruits and  vegetables instead of processed forms like juice or sauce. What foods can I eat?  Fruits Berries. Pears. Apples. Oranges. Avocado. Prunes and raisins. Dried figs. Vegetables Sweet potatoes. Spinach. Kale. Artichokes. Cabbage. Broccoli. Cauliflower. Green peas. Carrots. Squash. Grains Whole-grain breads. Multigrain cereal. Oats and oatmeal. Brown rice. Barley. Bulgur wheat. [place]. Quinoa. Bran muffins. Popcorn. Rye wafer crackers. Meats and other proteins [place], kidney, and pinto beans. Soybeans. Split peas. Lentils. Nuts and seeds. Dairy Fiber-fortified yogurt. Beverages Fiber-fortified soy milk. Fiber-fortified orange juice. Other foods Fiber bars. The items listed above may not be a complete list of recommended  foods and beverages. Contact a dietitian for more options. What foods are not recommended? Fruits Fruit juice. Cooked, strained fruit. Vegetables Fried potatoes. Canned vegetables. Well-cooked vegetables. Grains White bread. Pasta made with refined flour. White rice. Meats and other proteins Fatty cuts of meat. Fried chicken or fried fish. Dairy Milk. Yogurt. Cream cheese. Sour cream. Fats and oils Butters. Beverages Soft drinks. Other foods Cakes and pastries. The items listed above may not be a complete list of foods and beverages to avoid. Contact a dietitian for more information. Summary  Fiber is a type of carbohydrate. It is found in fruits, vegetables, whole grains, and beans.  There are many health benefits of eating a high-fiber diet, such as preventing constipation, lowering blood cholesterol, helping with weight loss, and reducing your risk of heart disease, diabetes, and certain cancers.  Gradually increase your intake of fiber. Increasing too fast can result in cramping, bloating, and gas. Drink plenty of water while you increase your fiber.  The best sources of fiber include whole fruits and vegetables, whole grains, nuts, seeds, and beans. This  information is not intended to replace advice given to you by your health care provider. Make sure you discuss any questions you have with your health care provider. Document Revised: 09/30/2017 Document Reviewed: 09/30/2017 Elsevier Patient Education  [place].     Semaglutide injection solution What is this medicine? SEMAGLUTIDE (Sem a GLOO tide) is used to improve blood sugar control in adults with type 2 diabetes. This medicine may be used with other diabetes medicines. This drug may also reduce the risk of heart attack or stroke if you have type 2 diabetes and risk factors for heart disease. This medicine may be used for other purposes; ask your health care provider or pharmacist if you have questions. COMMON BRAND NAME(S): OZEMPIC What should I tell my health care provider before I take this medicine? They need to know if you have any of these conditions:  endocrine tumors (MEN 2) or if someone in your family had these tumors  eye disease, vision problems  history of pancreatitis  kidney disease  stomach problems  thyroid cancer or if someone in your family had thyroid cancer  an unusual or allergic reaction to semaglutide, other medicines, foods, dyes, or preservatives  pregnant or trying to get pregnant  breast-feeding How should I use this medicine? This medicine is for injection under the skin of your upper leg (thigh), stomach area, or upper arm. It is given once every week (every 7 days). You will be taught how to prepare and give this medicine. Use exactly as directed. Take your medicine at regular intervals. Do not take it more often than directed. If you use this medicine with insulin, you should inject this medicine and the insulin separately. Do not mix them together. Do not give the injections right next to each other. Change (rotate) injection sites with each injection. It is important that you put your used needles and syringes in a special sharps  container. Do not put them in a trash can. If you do not have a sharps container, call your pharmacist or healthcare provider to get one. A special MedGuide will be given to you by the pharmacist with each prescription and refill. Be sure to read this information carefully each time. This drug comes with INSTRUCTIONS FOR USE. Ask your pharmacist for directions on how to use this drug. Read the information carefully. Talk to your pharmacist or health care provider if you  have questions. Talk to your pediatrician regarding the use of this medicine in children. Special care may be needed. Overdosage: If you think you have taken too much of this medicine contact a poison control center or emergency room at once. NOTE: This medicine is only for you. Do not share this medicine with others. What if I miss a dose? If you miss a dose, take it as soon as you can within 5 days after the missed dose. Then take your next dose at your regular weekly time. If it has been longer than 5 days after the missed dose, do not take the missed dose. Take the next dose at your regular time. Do not take double or extra doses. If you have questions about a missed dose, contact your health care provider for advice. What may interact with this medicine?  other medicines for diabetes Many medications may cause changes in blood sugar, these include:  alcohol containing beverages  antiviral medicines for HIV or AIDS  aspirin and aspirin-like drugs  certain medicines for blood pressure, heart disease, irregular heart beat  chromium  diuretics  male hormones, such as estrogens or progestins, birth control pills  fenofibrate  gemfibrozil  isoniazid  lanreotide  male hormones or anabolic steroids  MAOIs like Carbex, Eldepryl, Marplan, Nardil, and Parnate  medicines for weight loss  medicines for allergies, asthma, cold, or cough  medicines for depression, anxiety, or psychotic  disturbances  niacin  nicotine  NSAIDs, medicines for pain and inflammation, like ibuprofen or naproxen  octreotide  pasireotide  pentamidine  phenytoin  probenecid  quinolone antibiotics such as ciprofloxacin, levofloxacin, ofloxacin  some herbal dietary supplements  steroid medicines such as prednisone or cortisone  sulfamethoxazole; trimethoprim  thyroid hormones Some medications can hide the warning symptoms of low blood sugar (hypoglycemia). You may need to monitor your blood sugar more closely if you are taking one of these medications. These include:  beta-blockers, often used for high blood pressure or heart problems (examples include atenolol, metoprolol, propranolol)  clonidine  guanethidine  reserpine This list may not describe all possible interactions. Give your health care provider a list of all the medicines, herbs, non-prescription drugs, or dietary supplements you use. Also tell them if you smoke, drink alcohol, or use illegal drugs. Some items may interact with your medicine. What should I watch for while using this medicine? Visit your doctor or health care professional for regular checks on your progress. Drink plenty of fluids while taking this medicine. Check with your doctor or health care professional if you get an attack of severe diarrhea, nausea, and vomiting. The loss of too much body fluid can make it dangerous for you to take this medicine. A test called the HbA1C (A1C) will be monitored. This is a simple blood test. It measures your blood sugar control over the last 2 to 3 months. You will receive this test every 3 to 6 months. Learn how to check your blood sugar. Learn the symptoms of low and high blood sugar and how to manage them. Always carry a quick-source of sugar with you in case you have symptoms of low blood sugar. Examples include hard sugar candy or glucose tablets. Make sure others know that you can choke if you eat or drink when  you develop serious symptoms of low blood sugar, such as seizures or unconsciousness. They must get medical help at once. Tell your doctor or health care professional if you have high blood sugar. You might need to  change the dose of your medicine. If you are sick or exercising more than usual, you might need to change the dose of your medicine. Do not skip meals. Ask your doctor or health care professional if you should avoid alcohol. Many nonprescription cough and cold products contain sugar or alcohol. These can affect blood sugar. Pens should never be shared. Even if the needle is changed, sharing may result in passing of viruses like hepatitis or HIV. Wear a medical ID bracelet or chain, and carry a card that describes your disease and details of your medicine and dosage times. Do not become pregnant while taking this medicine. Women should inform their doctor if they wish to become pregnant or think they might be pregnant. There is a potential for serious side effects to an unborn child. Talk to your health care professional or pharmacist for more information. What side effects may I notice from receiving this medicine? Side effects that you should report to your doctor or health care professional as soon as possible:  allergic reactions like skin rash, itching or hives, swelling of the face, lips, or tongue  breathing problems  changes in vision  diarrhea that continues or is severe  lump or swelling on the neck  severe nausea  signs and symptoms of infection like fever or chills; cough; sore throat; pain or trouble passing urine  signs and symptoms of low blood sugar such as feeling anxious, confusion, dizziness, increased hunger, unusually weak or tired, sweating, shakiness, cold, irritable, headache, blurred vision, fast heartbeat, loss of consciousness  signs and symptoms of kidney injury like trouble passing urine or change in the amount of urine  trouble swallowing  unusual  stomach upset or pain  vomiting Side effects that usually do not require medical attention (report to your doctor or health care professional if they continue or are bothersome):  constipation  diarrhea  nausea  pain, redness, or irritation at site where injected  stomach upset This list may not describe all possible side effects. Call your doctor for medical advice about side effects. You may report side effects to FDA at 1-800-FDA-1088. Where should I keep my medicine? Keep out of the reach of children. Store unopened pens in a refrigerator between 2 and 8 degrees C (36 and 46 degrees F). Do not freeze. Protect from light and heat. After you first use the pen, it can be stored for 56 days at room temperature between 15 and 30 degrees C (59 and 86 degrees F) or in a refrigerator. Throw away your used pen after 56 days or after the expiration date, whichever comes first. Do not store your pen with the needle attached. If the needle is left on, medicine may leak from the pen. NOTE: This sheet is a summary. It may not cover all possible information. If you have questions about this medicine, talk to your doctor, pharmacist, or health care provider.  2020 Elsevier/Gold Standard (2019-08-11 09:41:51)

## 2020-10-20 ENCOUNTER — Other Ambulatory Visit: Payer: Self-pay | Admitting: Adult Health

## 2020-10-20 DIAGNOSIS — E875 Hyperkalemia: Secondary | ICD-10-CM

## 2020-10-20 LAB — COMPLETE METABOLIC PANEL WITH GFR
AG Ratio: 1.7 (calc) (ref 1.0–2.5)
ALT: 28 U/L (ref 9–46)
AST: 21 U/L (ref 10–35)
Albumin: 4.1 g/dL (ref 3.6–5.1)
Alkaline phosphatase (APISO): 39 U/L (ref 35–144)
BUN: 25 mg/dL (ref 7–25)
CO2: 27 mmol/L (ref 20–32)
Calcium: 9.8 mg/dL (ref 8.6–10.3)
Chloride: 105 mmol/L (ref 98–110)
Creat: 0.94 mg/dL (ref 0.70–1.18)
GFR, Est African American: 93 mL/min/{1.73_m2} (ref >=60)
GFR, Est Non African American: 80 mL/min/{1.73_m2} (ref >=60)
Globulin: 2.4 g/dL (calc) (ref 1.9–3.7)
Glucose, Bld: 126 mg/dL — ABNORMAL HIGH (ref 65–99)
Potassium: 5.5 mmol/L — ABNORMAL HIGH (ref 3.5–5.3)
Sodium: 139 mmol/L (ref 135–146)
Total Bilirubin: 0.5 mg/dL (ref 0.2–1.2)
Total Protein: 6.5 g/dL (ref 6.1–8.1)

## 2020-10-20 LAB — TSH: TSH: 3.44 mIU/L (ref 0.40–4.50)

## 2020-10-20 LAB — HEMOGLOBIN A1C
Hgb A1c MFr Bld: 6.5 % of total Hgb — ABNORMAL HIGH (ref <5.7)
Mean Plasma Glucose: 140 (calc)
eAG (mmol/L): 7.7 (calc)

## 2020-10-20 LAB — CBC WITH DIFFERENTIAL/PLATELET
Absolute Monocytes: 635 cells/uL (ref 200–950)
Basophils Absolute: 48 cells/uL (ref 0–200)
Basophils Relative: 0.7 %
Eosinophils Absolute: 138 cells/uL (ref 15–500)
Eosinophils Relative: 2 %
HCT: 42.9 % (ref 38.5–50.0)
Hemoglobin: 14.7 g/dL (ref 13.2–17.1)
Lymphs Abs: 2256 cells/uL (ref 850–3900)
MCH: 31.1 pg (ref 27.0–33.0)
MCHC: 34.3 g/dL (ref 32.0–36.0)
MCV: 90.7 fL (ref 80.0–100.0)
MPV: 10.3 fL (ref 7.5–12.5)
Monocytes Relative: 9.2 %
Neutro Abs: 3823 cells/uL (ref 1500–7800)
Neutrophils Relative %: 55.4 %
Platelets: 274 10*3/uL (ref 140–400)
RBC: 4.73 10*6/uL (ref 4.20–5.80)
RDW: 12 % (ref 11.0–15.0)
Total Lymphocyte: 32.7 %
WBC: 6.9 10*3/uL (ref 3.8–10.8)

## 2020-10-20 LAB — LIPID PANEL
Cholesterol: 135 mg/dL (ref <200)
HDL: 33 mg/dL — ABNORMAL LOW (ref >=40)
LDL Cholesterol (Calc): 78 mg/dL (calc)
Non-HDL Cholesterol (Calc): 102 mg/dL (calc) (ref <130)
Total CHOL/HDL Ratio: 4.1 (calc) (ref <5.0)
Triglycerides: 141 mg/dL (ref <150)

## 2020-10-20 LAB — MAGNESIUM: Magnesium: 2.4 mg/dL (ref 1.5–2.5)

## 2020-10-28 ENCOUNTER — Telehealth: Payer: Self-pay

## 2020-10-28 NOTE — Telephone Encounter (Signed)
Patient states that he thinks he might have a possible UTI, has the urge to void but nothing is coming out. Requesting to talk with [name].

## 2020-10-28 NOTE — Telephone Encounter (Signed)
Wife called and states that the patient now has blood in his urine. Dr. Melford Aase is aware and states that he needs to go to the ED. Wife advised.

## 2020-10-29 ENCOUNTER — Ambulatory Visit
Admission: RE | Admit: 2020-10-29 | Discharge: 2020-10-29 | Disposition: A | Discharge status: Home / Self Care | Payer: Medicare Other | Source: Ambulatory Visit | Attending: Family Medicine | Admitting: Family Medicine

## 2020-10-29 ENCOUNTER — Other Ambulatory Visit: Payer: Self-pay

## 2020-10-29 VITALS — BP 169/73 | HR 65 | Temp 98.7°F | Resp 20

## 2020-10-29 DIAGNOSIS — R8281 Pyuria: Secondary | ICD-10-CM | POA: Diagnosis not present

## 2020-10-29 LAB — POCT URINALYSIS DIP (MANUAL ENTRY)
Bilirubin, UA: NEGATIVE
Blood, UA: NEGATIVE
Glucose, UA: 100 mg/dL — AB
Ketones, POC UA: NEGATIVE mg/dL
Nitrite, UA: NEGATIVE
Protein Ur, POC: NEGATIVE mg/dL
Spec Grav, UA: 1.02 (ref 1.010–1.025)
Urobilinogen, UA: 0.2 E.U./dL
pH, UA: 5.5 (ref 5.0–8.0)

## 2020-10-29 MED ORDER — DOXYCYCLINE HYCLATE 100 MG PO TABS: 100.0000 mg | ORAL_TABLET | Freq: Two times a day (BID) | ORAL | 0 refills | Status: DC

## 2020-10-29 NOTE — ED Provider Notes (Signed)
[facility]    CSN: [id number] Arrival date & time: 10/29/20  1154      History   Chief Complaint Chief Complaint  Patient presents with  . Urinary Tract Infection    HPI Joshua Koch is a 74 y.o. male.   This is the initial visit for this 74 year old man.  Patient developed a new set of symptoms of the last 3 days.  On Wednesday he had an episode of incontinence.  On Thursday he had some discharge and he noticed some hematuria as well.  Patient had some chills and fatigue over the last several days.  He has had no extramarital relations.  He has had no history of discharge.  Patient has chronic low back pain and has been doing quite a bit of work outside with [employer].     Past Medical History:  Diagnosis Date  . Arthritis   . Asthma    childhood  . 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 Diabetes mellitus   . Diabetes mellitus type 2 with neurological manifestations ([place]) 10/04/2014  . Dyspnea   . Erectile dysfunction   . GERD (gastroesophageal reflux disease)   . Headache   . Heart murmur   . History of kidney stones   . Hypercholesteremia   . Hyperlipemia   . Hypertension   . Kidney stone   . Low back pain   . Obesity   . Rotator cuff dysfunction    partial tear , left shoulder  . Sleep apnea     Patient Active Problem List   Diagnosis Date Noted  . Lumbar spondylosis 02/11/2020  . Diabetic mononeuropathy associated with type 2 diabetes mellitus ([place]) 01/04/2020  . Inappropriate diet or eating habits 09/22/2019  . CKD stage 2 due to type 2 diabetes mellitus ([place]) 12/15/2018  . Aortic atherosclerosis ([place]) 09/06/2018  . Vitamin D deficiency 09/06/2018  . Morbid obesity ([place]) 09/06/2018  . OA (osteoarthritis) of knee 10/21/2017  . Type II diabetes mellitus with renal manifestations ([place]) 10/04/2014  . Benign essential HTN 10/04/2014  . Hyperlipidemia associated with type 2 diabetes mellitus ([place]) 10/04/2014  . Heart murmur  10/04/2014    Past Surgical History:  Procedure Laterality Date  . COLONOSCOPY    . KNEE ARTHROSCOPY    . PILONIDAL CYST EXCISION     from base of spine  . 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   Procedure: RIGHT TOTAL KNEE ARTHROPLASTY;  Surgeon: Gaynelle Arabian, MD;  Location: [facility];  Service: Orthopedics;  Laterality: Right;       Home Medications    Prior to Admission medications   Medication Sig Start Date End Date Taking? Authorizing Provider  APPLE CIDER VINEGAR PO Take by mouth daily.    [provider]  Ascorbic Acid (VITAMIN C PO) Take by mouth. 1 tablet daily    [provider]  aspirin (ECOTRIN LOW STRENGTH) 81 MG EC tablet Take 1 tablet daily 09/06/18   Unk Pinto, MD  Celecoxib (CELEBREX PO) Take 200 mg by mouth daily as needed. Daily PRN back pain    [provider]  cetirizine (ZYRTEC) 10 MG tablet Take 10 mg by mouth daily.    [provider]  CHOLECALCIFEROL PO Take 10,000 Units by mouth daily.    [provider]  doxycycline (VIBRA-TABS) 100 MG tablet Take 1 tablet (100 mg total) by mouth 2 (two) times daily. 10/29/20   Robyn Haber, MD  furosemide (LASIX)  20 MG tablet furosemide 20 mg tablet    [provider]  glimepiride (AMARYL) 1 MG tablet Take     1 tablet        2 x /day       with Meals for Diabetes 10/19/20   Liane Comber, NP  glimepiride (AMARYL) 4 MG tablet glimepiride 4 mg tablet    [provider]  losartan (COZAAR) 100 MG tablet Take 1 tablet (100 mg total) by mouth every evening. 01/23/20   Liane Comber, NP  losartan (COZAAR) 100 MG tablet losartan 100 mg tablet    [provider]  Magnesium 400 MG CAPS Take 1 capsule by mouth 2 (two) times daily.    [provider]  metFORMIN (GLUCOPHAGE-XR) 500 MG 24 hr tablet TAKE 2 TABLETS BY MOUTH 2  TIMES PER DAY WITH MEALS  FOR DIABETES 07/04/20   Liane Comber, NP  Omega-3 Fatty Acids (FISH OIL PO) Take by  mouth 2 (two) times daily.    [provider]  pioglitazone-metformin (ACTOPLUS MET) 15-850 MG tablet pioglitazone 15 mg-metformin 850 mg tablet    [provider]  simvastatin (ZOCOR) 20 MG tablet TAKE 1 TABLET BY MOUTH AT  BEDTIME FOR CHOLESTEROL 02/20/20   Liane Comber, NP  simvastatin (ZOCOR) 20 MG tablet simvastatin 20 mg tablet    [provider]  Zinc 50 MG TABS Take by mouth daily.    [provider]    Family History Family History  Problem Relation Age of Onset  . Heart attack Father 61       MI  . Lung cancer Sister        smoker  . COPD Brother   . CAD Brother 79  . Stroke Brother     Social History Social History   Tobacco Use  . Smoking status: Never Smoker  . Smokeless tobacco: Never Used  Vaping Use  . Vaping Use: Never used  Substance Use Topics  . Alcohol use: Yes    Alcohol/week: 1.0 standard drink    Types: 1 Cans of beer per week    Comment: occasional  . Drug use: No     Allergies   No known allergies   Review of Systems Review of Systems  Genitourinary: Positive for discharge and hematuria. Negative for difficulty urinating, dysuria, penile swelling and scrotal swelling.  All other systems reviewed and are negative.    Physical Exam Triage Vital Signs ED Triage Vitals  Enc Vitals Group     BP      Pulse      Resp      Temp      Temp src      SpO2      Weight      Height      Head Circumference      Peak Flow      Pain Score      Pain Loc      Pain Edu?      Excl. in [place]?    No data found.  Updated Vital Signs BP (!) 169/73 (BP Location: Left Arm)   Pulse 65   Temp 98.7 F (37.1 C) (Oral)   Resp 20   SpO2 98%   Physical Exam Vitals and nursing note reviewed.  Constitutional:      Appearance: Normal appearance. He is obese. He is not ill-appearing.  HENT:     Head: Normocephalic.  Eyes:     Conjunctiva/sclera: Conjunctivae normal.  Cardiovascular:     Rate and Rhythm: Normal  rate.  Pulmonary:     Effort: Pulmonary effort is normal.  Abdominal:     Tenderness: There is no abdominal tenderness. There is no guarding.  Genitourinary:    Penis: Normal.      Testes: Normal.     Comments: No hernia Musculoskeletal:        General: Normal range of motion.     Cervical back: Normal range of motion and neck supple.  Skin:    General: Skin is warm and dry.     Findings: No rash.  Neurological:     General: No focal deficit present.     Mental Status: He is alert and oriented to person, place, and time.  Psychiatric:        Mood and Affect: Mood normal.      UC Treatments / Results  Labs (all labs ordered are listed, but only abnormal results are displayed) Labs Reviewed  POCT URINALYSIS DIP (MANUAL ENTRY) - Abnormal; Notable for the following components:      Result Value   Clarity, UA hazy (*)    Glucose, UA =100 (*)    Leukocytes, UA Small (1+) (*)    All other components within normal limits  URINE CULTURE    EKG   Radiology No results found.  Procedures Procedures (including critical care time)  Medications Ordered in UC Medications - No data to display  Initial Impression / Assessment and Plan / UC Course  I have reviewed the triage vital signs and the nursing notes.  Pertinent labs & imaging results that were available during my care of the patient were reviewed by me and considered in my medical decision making (see chart for details).    Final Clinical Impressions(s) / UC Diagnoses   Final diagnoses:  Pyuria     Discharge Instructions     Your symptoms are most likely coming from a prostate infection.  We are running a urine culture to rule out a bladder infection as well.  We will call you if you need to change or add an antibiotic    ED Prescriptions    Medication Sig Dispense Auth. Provider   doxycycline (VIBRA-TABS) 100 MG tablet Take 1 tablet (100 mg total) by mouth 2 (two) times daily. 28 tablet Robyn Haber,  MD     I have reviewed the PDMP during this encounter.   Robyn Haber, MD 10/29/20 1248

## 2020-10-29 NOTE — Discharge Instructions (Addendum)
Your symptoms are most likely coming from a prostate infection.  We are running a urine culture to rule out a bladder infection as well.  We will call you if you need to change or add an antibiotic

## 2020-10-29 NOTE — ED Triage Notes (Signed)
Pt is here after having an accident & urinated on himself Wednesday, pt states Thursday he noticed blood in his urine. Friday pt noticed penile discharge, pt was seen on 10/19/2020 at his PCP.

## 2020-11-02 ENCOUNTER — Telehealth (HOSPITAL_COMMUNITY): Payer: Self-pay | Admitting: Emergency Medicine

## 2020-11-02 LAB — URINE CULTURE: Culture: >=100000 — AB

## 2020-11-02 MED ORDER — SULFAMETHOXAZOLE-TRIMETHOPRIM 800-160 MG PO TABS: 1.0000 | ORAL_TABLET | Freq: Two times a day (BID) | ORAL | 0 refills | Status: AC

## 2020-11-16 ENCOUNTER — Other Ambulatory Visit: Payer: Self-pay | Admitting: Internal Medicine

## 2020-11-16 DIAGNOSIS — E1122 Type 2 diabetes mellitus with diabetic chronic kidney disease: Secondary | ICD-10-CM

## 2020-11-16 DIAGNOSIS — N182 Chronic kidney disease, stage 2 (mild): Secondary | ICD-10-CM

## 2020-11-21 ENCOUNTER — Other Ambulatory Visit: Payer: Self-pay | Admitting: Adult Health

## 2020-11-21 DIAGNOSIS — I10 Essential (primary) hypertension: Secondary | ICD-10-CM

## 2021-01-03 ENCOUNTER — Encounter: Payer: 59 | Admitting: Adult Health

## 2021-01-10 DIAGNOSIS — Z85828 Personal history of other malignant neoplasm of skin: Secondary | ICD-10-CM | POA: Diagnosis not present

## 2021-01-10 DIAGNOSIS — D2261 Melanocytic nevi of right upper limb, including shoulder: Secondary | ICD-10-CM | POA: Diagnosis not present

## 2021-01-10 DIAGNOSIS — L817 Pigmented purpuric dermatosis: Secondary | ICD-10-CM | POA: Diagnosis not present

## 2021-01-10 DIAGNOSIS — L821 Other seborrheic keratosis: Secondary | ICD-10-CM | POA: Diagnosis not present

## 2021-01-10 DIAGNOSIS — D1801 Hemangioma of skin and subcutaneous tissue: Secondary | ICD-10-CM | POA: Diagnosis not present

## 2021-01-10 DIAGNOSIS — D2262 Melanocytic nevi of left upper limb, including shoulder: Secondary | ICD-10-CM | POA: Diagnosis not present

## 2021-01-10 DIAGNOSIS — D2239 Melanocytic nevi of other parts of face: Secondary | ICD-10-CM | POA: Diagnosis not present

## 2021-01-10 DIAGNOSIS — L918 Other hypertrophic disorders of the skin: Secondary | ICD-10-CM | POA: Diagnosis not present

## 2021-01-10 DIAGNOSIS — D225 Melanocytic nevi of trunk: Secondary | ICD-10-CM | POA: Diagnosis not present

## 2021-01-10 DIAGNOSIS — D2271 Melanocytic nevi of right lower limb, including hip: Secondary | ICD-10-CM | POA: Diagnosis not present

## 2021-01-10 DIAGNOSIS — L570 Actinic keratosis: Secondary | ICD-10-CM | POA: Diagnosis not present

## 2021-01-19 ENCOUNTER — Encounter: Payer: Medicare Other | Admitting: Adult Health

## 2021-01-19 DIAGNOSIS — H524 Presbyopia: Secondary | ICD-10-CM | POA: Diagnosis not present

## 2021-01-19 DIAGNOSIS — H40053 Ocular hypertension, bilateral: Secondary | ICD-10-CM | POA: Diagnosis not present

## 2021-01-19 DIAGNOSIS — H25013 Cortical age-related cataract, bilateral: Secondary | ICD-10-CM | POA: Diagnosis not present

## 2021-01-19 DIAGNOSIS — E113292 Type 2 diabetes mellitus with mild nonproliferative diabetic retinopathy without macular edema, left eye: Secondary | ICD-10-CM | POA: Diagnosis not present

## 2021-01-19 LAB — HM DIABETES EYE EXAM

## 2021-01-24 ENCOUNTER — Encounter: Payer: Self-pay | Admitting: Internal Medicine

## 2021-02-04 NOTE — Progress Notes (Signed)
Complete Physical  Assessment and Plan:  Joshua Koch was seen today for annual exam.  Diagnoses and all orders for this visit:  Encounter for routine adult health examination with abnormal findings Declines flu and pneumonia vaccine today   Benign essential HTN Continue losartan 100 mg, add HCTZ 12.5 mg in AM Monitor blood pressure at home; call if consistently over 130/80 Continue DASH diet.   Reminder to go to the ER if any CP, SOB, nausea, dizziness, severe HA, changes vision/speech, left arm numbness and tingling and jaw pain. -     CBC with Differential/Platelet -     COMPLETE METABOLIC PANEL WITH GFR -     Magnesium -     TSH -     Microalbumin / creatinine urine ratio -     Urinalysis, Routine w reflex microscopic -     EKG 12-Lead  Aortic atherosclerosis ([place]) Per CT 2018 Control blood pressure, cholesterol, glucose, increase exercise.  -     Lipid panel  Type 2 diabetes mellitus with stage 2 chronic kidney disease, without long-term current use of insulin [facility]) Education: Reviewed 'ABCs' of diabetes management (respective goals in parentheses):  A1C (<7), blood pressure (<130/80), and cholesterol (LDL <70) Eye Exam yearly and Dental Exam every 6 months. Dietary recommendations Physical Activity recommendations Foot exam completed Metformin 2000 mg daily, glimepiride 2 mg BID (reduced dose encourage weight loss) Discussed switch to ozempic for weight loss benefits; he will consider but declines Denies nutritionist referral today  -     COMPLETE METABOLIC PANEL WITH GFR -     Hemoglobin A1c -     Microalbumin / creatinine urine ratio -     Urinalysis, Routine w reflex microscopic  Hyperlipidemia associated with type 2 diabetes mellitus ([place]) Continue statin; titrate for LDL goal <70 Continue low cholesterol diet and exercise.  Check lipid panel.  -     Lipid panel -     TSH  CKD stage 2 due to type 2 diabetes mellitus (HCC) Increase fluids, avoid NSAIDS, monitor  sugars, will monitor -     COMPLETE METABOLIC PANEL WITH GFR -     Microalbumin / creatinine urine ratio -     Urinalysis, Routine w reflex microscopic  Diabetic mononeuropathy associated with type 2 diabetes mellitus ([place]) Declines medications at this time, discussed feet checks.  -     HM DIABETES FOOT EXAM  Erectile dysfunction associated with T2DM ([place]) Per patient has seen uro and tried numerous medications Declines further interventions; relationship with wife remains good  Osteoarthritis of knee, unspecified laterality, unspecified osteoarthritis type Tylenol PRN; established with ortho to follow up if needed  Vitamin D deficiency At goal at recent check; continue to recommend supplementation for goal of 60-100 - vitamin D level  Morbid obesity - BMI 35+ with comorbidities (T2DM, thn, hyperlipidemia) Excellent progress over the last year with lifestyle modification;  Long discussion about weight loss, diet, and exercise Discussed ideal weight for height and initial weight goal (<245lb) Patient will work on portions Will follow up q84m  Screening for thyroid disorder -     TSH  Screening for prostate cancer No family history; denies LUTs -     PSA  Orders Placed This Encounter  Procedures  . CBC with Differential/Platelet  . COMPLETE METABOLIC PANEL WITH GFR  . Magnesium  . Lipid panel  . TSH  . Hemoglobin A1c  . VITAMIN D 25 Hydroxy (Vit-D Deficiency, Fractures)  . PSA  . Microalbumin /  creatinine urine ratio  . Urinalysis, Routine w reflex microscopic  . Vitamin B12  . EKG 12-Lead  . HM DIABETES FOOT EXAM    Discussed med's effects and SE's. Screening labs and tests as requested with regular follow-up as recommended. Over 40 minutes of exam, counseling, chart review and critical decision making was performed  Future Appointments  Date Time Provider [place]  05/10/2021 11:30 AM Liane Comber, NP [facility] None  08/21/2021 10:30 AM Unk Pinto, MD [facility] None  10/19/2021 11:00 AM Liane Comber, NP [facility] None  02/06/2022  3:00 PM Liane Comber, NP [facility] None    HPI 75 y.o. male patient presents for a complete physical. 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   He is retired from [employer], educational, married, 2 children, 2 grandchildren 36 and 52 y/o close by.  He has no concerns.   Since last visit he was seen in Sun 10/29/2020, pyruia/hematuria and completed 2 week course of doxycycline, reports sx fully resolved without recurrence.   He has knee arthritis, had R TKA in 2018, left is mild, occasional tylenol. Has back pain, has seen Dr. Ellene Route, did PT, worse pain and stopped, now on celebrex 200 mg daily and doing well. Was discussing further imaging/MRI and injections but doing well and holding off for now.  He reports has bil hip aching after walking, some question of spinal stenosis.   BMI is Body mass index is 35.96 kg/m., he has been working on diet, exercise limited due to back pain and knee. He has been prescribed phentermine/topamax in the past but stopped due to not liking how he felt with medications. Has been doing well reducing portions of starches, etc. Weight is down from 299 lb in 2019.  He has been cutting down on sweets, admits loves food, meat and potatoes  - "I think I have a food addiction." Eats for flavor, not for hunger -  Have discussed ozempic, would consider but has declined thus far, continues  to decline today Discussed nutritionist referral; declines at this time  Drinks 2 cups of coffee daily.  He estimates 3-4 glasses water minimum, typically more Wt Readings from Last 3 Encounters:  02/06/21 257 lb 12.8 oz (116.9 kg)  10/19/20 254 lb (115.2 kg)  07/14/20 255 lb (115.7 kg)   He has aortic atherosclerosis per imaing on CT in 2018. He had a normal stress myoview in 2015.   He reports checking intermittently at home, has been running higher than typical, 130/140s/70s, today their BP is BP: (!) 142/72, taking losartan 100 mg daily at night.  He does not workout. He denies chest pain, shortness of breath, dizziness.   He is on cholesterol medication (simvastatin 20 mg daily) and denies myalgias. His cholesterol is not at goal of LDL <70. The cholesterol last visit was:   Lab Results  Component Value Date   CHOL 135 10/19/2020   HDL 33 (L) 10/19/2020   LDLCALC 78 10/19/2020   TRIG 141 10/19/2020   CHOLHDL 4.1 10/19/2020   He has been working on diet and exercise for T2 diabetes with  CKD II (on metformin 2000 mg, glimepiride 2 mg BID - dose reduced to assist with weight loss), he is on bASA, he is on ACE/ARB and denies increased appetite, nausea, polydipsia, polyuria, visual disturbances and vomiting. He does have neuropathy, very poor sensation in bil feet. Has glucometer but doesn't check regularly.  He had normal ABI 08/2020. Mild retinopathy per Dr. Prudencio Burly. He has had ED for many years; numerous medications didn't help and declines further.  Last A1C in the office was:  Lab Results  Component Value Date   HGBA1C 6.5 (H) 10/19/2020   He has CKD II associated with T2DM and htn monitored at this office. On losartan. Last GFR: Lab Results  Component Value Date   GFRNONAA 80 10/19/2020    Patient is on Vitamin D supplement.   Lab Results  Component Value Date   VD25OH 69 09/24/2019     Last PSA was: Lab Results  Component Value Date   PSA 0.4 01/04/2020  Denies weak  stream, straining, nocturia, dribbling.     Current Medications:  Current Outpatient Medications on File Prior to Visit  Medication Sig Dispense Refill  . APPLE CIDER VINEGAR PO Take by mouth daily.    . Ascorbic Acid (VITAMIN C PO) Take by mouth. 1 tablet daily    . aspirin (ECOTRIN LOW STRENGTH) 81 MG EC tablet Take 1 tablet daily    . Celecoxib (CELEBREX PO) Take 200 mg by mouth daily. Daily PRN back pain    . cetirizine (ZYRTEC) 10 MG tablet Take 10 mg by mouth daily.    . CHOLECALCIFEROL PO Take 10,000 Units by mouth daily.    Marland Kitchen glimepiride (AMARYL) 2 MG tablet Take      1 tablet      2 x /day      with Meals       for Diabetes 180 tablet 0  . losartan (COZAAR) 100 MG tablet Take     1 tablet      Daily       for BP 90 tablet 0  . Magnesium 400 MG CAPS Take 1 capsule by mouth 2 (two) times daily.    . metFORMIN (GLUCOPHAGE-XR) 500 MG 24 hr tablet TAKE 2 TABLETS BY MOUTH 2  TIMES PER DAY WITH MEALS  FOR DIABETES 360 tablet 3  . Omega-3 Fatty Acids (FISH OIL PO) Take by mouth 2 (two) times daily.    . simvastatin (ZOCOR) 20 MG tablet TAKE 1 TABLET BY MOUTH AT  BEDTIME FOR CHOLESTEROL 90 tablet 3  . Zinc 50 MG TABS Take by mouth daily.     No current facility-administered medications on file prior to visit.   Allergies:  Allergies  Allergen Reactions  . No Known Allergies    Health Maintenance:  Immunization History  Administered Date(s) Administered  . PFIZER(Purple Top)SARS-COV-2 Vaccination 01/01/2020, 01/22/2020, 09/07/2020  . Pneumococcal-Unspecified 05/05/2009, 03/03/2013  . Td 05/05/2009  . Tdap 06/21/2015  . Zoster 03/03/2013    Preventative care: Last colonoscopy: Dr Earlean Shawl, 12/24/2019 due 2031  Prior vaccinations: TD or Tdap: 2016  Influenza: Declines  Pneumococcal: 2014 Prevnar13: Declines Zostavax: 02/2013 Covid 19: 3/3, 2021, pfizer   Names of Other Physician/Practitioners you currently use: 1. [facility] here for  primary care 2. Eye Exam, Dr. Prudencio Burly,  Diabetic 01/19/2021, mild retinopathy  3. Dental Exam, Dr. ?, last in 2019, Due was postponing due to pandemic, encouraged to schedule  4. Derm, Dr. Amy Martinique, last 01/2021  Patient Care Team: Unk Pinto, MD as PCP - General (Internal Medicine) Tisovec, Fransico Him, MD (Internal Medicine)  Medical History:  has Type II diabetes mellitus with renal manifestations ([place]); Benign essential HTN; Hyperlipidemia associated with type 2 diabetes mellitus (HCC); OA (osteoarthritis) of knee; Aortic atherosclerosis ([place]); Vitamin D deficiency; Morbid obesity ([place]); CKD stage 2 due to type 2 diabetes mellitus ([place]); Inappropriate diet or eating habits; Diabetic mononeuropathy associated with type 2 diabetes mellitus ([place]); Lumbar spondylosis; and 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 Family History:  His family history includes CAD (age of onset: 80) in his brother; COPD in his brother; Heart attack (age of onset: 64) in his father; Lung cancer in his sister; Stroke in his brother. Social History:   reports that he has never smoked. He has never used smokeless tobacco. He reports current alcohol use of about 1.0 standard drink of alcohol per week. He reports that he does not use drugs.   Review of Systems:  Review of Systems  Constitutional: Negative for malaise/fatigue and weight loss.  HENT: Negative for hearing loss and tinnitus.   Eyes: Negative for blurred vision and double vision.  Respiratory: Negative for cough, sputum production, shortness of breath and wheezing.   Cardiovascular: Negative for chest pain, palpitations, orthopnea, claudication, leg swelling and PND.  Gastrointestinal: Negative for abdominal pain,  blood in stool, constipation, diarrhea, heartburn, melena, nausea and vomiting.  Genitourinary: Negative.   Musculoskeletal: Positive for back pain (with bil hip and thigh pain with ambulation, ortho/neuro following). Negative for falls, joint pain and myalgias.  Skin: Negative for rash.  Neurological: Positive for tingling (numnbness, tingling bil feet intermittently for many years). Negative for dizziness, sensory change, weakness and headaches.  Endo/Heme/Allergies: Negative for polydipsia.  Psychiatric/Behavioral: Negative.  Negative for depression, memory loss, substance abuse and suicidal ideas. The patient is not nervous/anxious and does not have insomnia.   All other systems reviewed and are negative.   Physical Exam: Estimated body mass index is 35.96 kg/m as calculated from the following:   Height as of this encounter: 5\' 11"  (1.803 m).   Weight as of this encounter: 257 lb 12.8 oz (116.9 kg). BP (!) 142/72   Pulse 69   Temp (!) 97.3 F (36.3 C)   Ht 5\' 11"  (1.803 m)   Wt 257 lb 12.8 oz (116.9 kg)   SpO2 97%   BMI 35.96 kg/m  General Appearance: Well nourished, in no apparent distress.  Eyes: PERRLA, EOMs, conjunctiva no swelling or erythema Sinuses: No Frontal/maxillary tenderness  ENT/Mouth: Ext aud canals clear, normal light reflex with TMs without erythema, bulging. Good dentition. No erythema, swelling, or exudate on post pharynx. Tonsils not swollen or erythematous. Hearing normal.  Neck: Supple, thyroid normal. No bruits  Respiratory: Respiratory effort normal, BS equal bilaterally without rales, rhonchi, wheezing or stridor.  Cardio: RRR without murmurs, rubs or gallops. 1+ symmetrical peripheral pulses without edema. Feet cool to touch. Chest: symmetric, with normal excursions and percussion.  Abdomen: Soft, obese abdomen, nontender, no guarding, rebound, masses, or organomegaly. He has large ventral hernia without tenderness.  Lymphatics: Non tender without  lymphadenopathy.   Genitourinary: Defer, no concerns Musculoskeletal: Full ROM all peripheral extremities, 5/5 strength, and normal gait. Neg straight leg raise.  Skin: Warm, dry without rashes, lesions, ecchymosis. Neuro:  Cranial nerves intact, reflexes equal bilaterally. Normal muscle tone, no cerebellar symptoms. Sensation intact to light touch but not to monofilament bilaterally.  Psych: Awake and oriented X 3, normal affect, Insight and Judgment appropriate.   EKG: Sinus rhythm, left anterior fascicular block, NSCPT  Joshua Koch 4:49 PM [facility]

## 2021-02-06 ENCOUNTER — Ambulatory Visit (INDEPENDENT_AMBULATORY_CARE_PROVIDER_SITE_OTHER): Payer: Medicare Other | Admitting: Adult Health

## 2021-02-06 ENCOUNTER — Encounter: Payer: Self-pay | Admitting: Adult Health

## 2021-02-06 ENCOUNTER — Other Ambulatory Visit: Payer: Self-pay

## 2021-02-06 VITALS — BP 142/72 | HR 69 | Temp 97.3°F | Ht 71.0 in | Wt 257.8 lb

## 2021-02-06 DIAGNOSIS — Z0000 Encounter for general adult medical examination without abnormal findings: Secondary | ICD-10-CM | POA: Diagnosis not present

## 2021-02-06 DIAGNOSIS — R011 Cardiac murmur, unspecified: Secondary | ICD-10-CM

## 2021-02-06 DIAGNOSIS — Z1329 Encounter for screening for other suspected endocrine disorder: Secondary | ICD-10-CM

## 2021-02-06 DIAGNOSIS — N521 Erectile dysfunction due to diseases classified elsewhere: Secondary | ICD-10-CM

## 2021-02-06 DIAGNOSIS — Z125 Encounter for screening for malignant neoplasm of prostate: Secondary | ICD-10-CM

## 2021-02-06 DIAGNOSIS — I700 Atherosclerosis of aorta: Secondary | ICD-10-CM

## 2021-02-06 DIAGNOSIS — E559 Vitamin D deficiency, unspecified: Secondary | ICD-10-CM | POA: Diagnosis not present

## 2021-02-06 DIAGNOSIS — R5382 Chronic fatigue, unspecified: Secondary | ICD-10-CM | POA: Diagnosis not present

## 2021-02-06 DIAGNOSIS — Z130 Encounter for screening for diseases of the blood and blood-forming organs and certain disorders involving the immune mechanism: Secondary | ICD-10-CM | POA: Diagnosis not present

## 2021-02-06 DIAGNOSIS — E1122 Type 2 diabetes mellitus with diabetic chronic kidney disease: Secondary | ICD-10-CM | POA: Diagnosis not present

## 2021-02-06 DIAGNOSIS — E113299 Type 2 diabetes mellitus with mild nonproliferative diabetic retinopathy without macular edema, unspecified eye: Secondary | ICD-10-CM

## 2021-02-06 DIAGNOSIS — Z1389 Encounter for screening for other disorder: Secondary | ICD-10-CM

## 2021-02-06 DIAGNOSIS — E1141 Type 2 diabetes mellitus with diabetic mononeuropathy: Secondary | ICD-10-CM

## 2021-02-06 DIAGNOSIS — I10 Essential (primary) hypertension: Secondary | ICD-10-CM

## 2021-02-06 DIAGNOSIS — E1169 Type 2 diabetes mellitus with other specified complication: Secondary | ICD-10-CM | POA: Diagnosis not present

## 2021-02-06 DIAGNOSIS — E785 Hyperlipidemia, unspecified: Secondary | ICD-10-CM

## 2021-02-06 DIAGNOSIS — N182 Chronic kidney disease, stage 2 (mild): Secondary | ICD-10-CM | POA: Diagnosis not present

## 2021-02-06 DIAGNOSIS — G589 Mononeuropathy, unspecified: Secondary | ICD-10-CM

## 2021-02-06 MED ORDER — HYDROCHLOROTHIAZIDE 12.5 MG PO TABS: 12.5000 mg | ORAL_TABLET | Freq: Every day | ORAL | 0 refills | Status: DC

## 2021-02-06 NOTE — Patient Instructions (Addendum)
Joshua Koch , Thank you for taking time to come for your Annual Wellness Visit. I appreciate your ongoing commitment to your health goals. Please review the following plan we discussed and let me know if I can assist you in the future.   These are the goals we discussed: Goals    . Blood Pressure < 130/80    . DIET - INCREASE WATER INTAKE     65-80+ fluid ounces daily     . HEMOGLOBIN A1C < 7    . LDL CALC < 70    . Weight (lb) < 220 lb (99.8 kg)       This is a list of the screening recommended for you and due dates:  Health Maintenance  Topic Date Due  . Flu Shot  Never done  . Hemoglobin A1C  04/18/2021  . Eye exam for diabetics  01/19/2022  . Complete foot exam   02/06/2022  . Tetanus Vaccine  06/20/2025  . Colon Cancer Screening  12/23/2029  . COVID-19 Vaccine  Completed  .  Hepatitis C: One time screening is recommended by Center for Disease Control  (CDC) for  adults born from [birth year] through 1964.   Completed  . Pneumonia vaccines  Discontinued     General weight loss tips    drink a tall glass of water 30 min before meals   Always eat at the table; avoid eating while distracted  Don't eat until you're stuffed- eat slowly, listen to your stomach and eat until you are 80% full     Try eating off of a salad plate; wait 10 min after finishing before going back for seconds   Start by eating the vegetables on your plate; aim for 50% of your meals to be fruits and vegetables   Then eat your protein - lean meats (grass fed if possible), fish, beans, nuts/seeds in moderation   Avoid sugar, flour, processed - the closer it looks to it's original form in nature, typically the better it is for you, and will avoid triggering a glucose spike that will store your calories as fat and make you more hungry   Splurge in moderation if needed - "assign" meals when you get to splurge and have the "bad stuff" - I like to follow a 80% - 20% plan- "healthy" choices 80 % of the time,  "splurge" choices in moderation 20% of the time   Make sustainable changes - this is a journey to build lifelong healthy habits! A healthy change should help you feel energized, focused and light on your feet. If you make a change and you feel miserable doing it, please ask for help :)       Antiinflammatory diet  Doctors are learning that one of the best ways to reduce inflammation lies not in the medicine cabinet, but in the refrigerator.  By following an anti-inflammatory diet you can fight off inflammation for good.  What does an anti-inflammatory diet do? Your immune system becomes activated when your body recognizes anything that is foreign--such as an invading microbe, plant pollen, or chemical. This often triggers a process called inflammation. Intermittent bouts of inflammation directed at truly threatening invaders protect your health.  However, sometimes inflammation persists, day in and day out, even when you are not threatened by a foreign invader. That's when inflammation can become your enemy. Many major diseases that plague us--including cancer, heart disease, diabetes, arthritis, depression, and Alzheimer's--have been linked to chronic inflammation.  One of the most  powerful tools to combat inflammation comes not from the pharmacy, but from the grocery store.   Protect yourself from the damage of chronic inflammation. Science has proven that chronic, low-grade inflammation can turn into a silent killer that contributes to cardiovascular disease, cancer, type 2 diabetes and other conditions.   Choose the right anti-inflammatory foods, and you may be able to reduce your risk of illness. Consistently pick the wrong ones, and you could accelerate the inflammatory disease process.     Foods that cause inflammation Try to avoid or limit these foods as much as possible:   refined carbohydrates, such as white bread and pastries  [place] fries and other fried foods  soda and  other sugar-sweetened beverages  red meat (burgers, steaks) and processed meat (hot dogs, sausage)  margarine, shortening, and lard  The health risks of inflammatory foods Not surprisingly, the same foods on an inflammation diet are generally considered bad for our health, including sodas and refined carbohydrates, as well as red meat and processed meats.  "Some of the foods that have been associated with an increased risk for chronic diseases such as type 2 diabetes and heart disease are also associated with excess inflammation," Dr. Melanee Spry says. "It's not surprising, since inflammation is an important underlying mechanism for the development of these diseases."  Unhealthy foods also contribute to weight gain, which is itself a risk factor for inflammation. Yet in several studies, even after researchers took obesity into account, the link between foods and inflammation remained, which suggests weight gain isn't the sole driver. "Some of the food components or ingredients may have independent effects on inflammation over and above increased caloric intake," Dr. Melanee Spry says.  Anti-inflammatory foods An anti-inflammatory diet should include these foods:   tomatoes  olive oil  green leafy vegetables, such as spinach, kale, and collards  nuts like almonds and walnuts  fatty fish like salmon, mackerel, tuna, and sardines  fruits such as strawberries, blueberries, cherries, and oranges  Benefits of anti-inflammatory foods On the flip side are beverages and foods that reduce inflammation, in particular fruits and vegetables such as blueberries, apples, and leafy greens that are high in natural antioxidants and polyphenols--protective compounds found in plants.  Studies have also associated nuts with reduced markers of inflammation and a lower risk of cardiovascular disease and diabetes. Coffee, which contains polyphenols and other anti-inflammatory compounds, may protect against inflammation, as  well.  Anti-inflammatory diet To reduce levels of inflammation, aim for an overall healthy diet. If you're looking for an eating plan that closely follows the tenets of anti-inflammatory eating, consider the Mediterranean diet, which is high in fruits, vegetables, nuts, whole grains, fish, and healthy oils.  In addition to lowering inflammation, a more natural, less processed diet can have noticeable effects on your physical and emotional health and overall quality of life.   [email or web address]     HYPERTENSION INFORMATION  Monitor your blood pressure at home, please keep a record and bring that in with you to your next office visit.   Go to the ER if any CP, SOB, nausea, dizziness, severe HA, changes vision/speech  Testing/Procedures: HOW TO TAKE YOUR BLOOD PRESSURE:  Rest 5 minutes before taking your blood pressure.  Don't smoke or drink caffeinated beverages for at least 30 minutes before.  Take your blood pressure before (not after) you eat.  Sit comfortably with your back supported and both feet on the floor (don't cross your legs).  Elevate your arm to heart level on a  table or a desk.  Use the proper sized cuff. It should fit smoothly and snugly around your bare upper arm. There should be enough room to slip a fingertip under the cuff. The bottom edge of the cuff should be 1 inch above the crease of the elbow.  Due to a recent study, SPRINT, we have changed our goal for the systolic or top blood pressure number. Ideally we want your top number at 120.  In the [facility] Trial, 5000 people were randomized to a goal BP of 120 and 5000 people were randomized to a goal BP of less than 140. The patients with the goal BP at 120 had LESS DEMENTIA, LESS HEART ATTACKS, AND LESS STROKES, AS WELL AS OVERALL DECREASED MORTALITY OR DEATH RATE.   There was another study that showed taking your blood pressure medications at night  decrease cardiovascular events.  However if you are on a fluid pill, please take this in the morning.   If you are willing, our goal BP is the top number of 120.  Your most recent BP: BP: (!) 142/72   Take your medications faithfully as instructed. Maintain a healthy weight. Get at least 150 minutes of aerobic exercise per week. Minimize salt intake. Minimize alcohol intake  DASH Eating Plan DASH stands for "Dietary Approaches to Stop Hypertension." The DASH eating plan is a healthy eating plan that has been shown to reduce high blood pressure (hypertension). Additional health benefits may include reducing the risk of type 2 diabetes mellitus, heart disease, and stroke. The DASH eating plan may also help with weight loss. WHAT DO I NEED TO KNOW ABOUT THE DASH EATING PLAN? For the DASH eating plan, you will follow these general guidelines:  Choose foods with a percent daily value for sodium of less than 5% (as listed on the food label).  Use salt-free seasonings or herbs instead of table salt or sea salt.  Check with your health care provider or pharmacist before using salt substitutes.  Eat lower-sodium products, often labeled as "lower sodium" or "no salt added."  Eat fresh foods.  Eat more vegetables, fruits, and low-fat dairy products.  Choose whole grains. Look for the word "whole" as the first word in the ingredient list.  Choose fish and skinless chicken or [place] more often than red meat. Limit fish, poultry, and meat to 6 oz (170 g) each day.  Limit sweets, desserts, sugars, and sugary drinks.  Choose heart-healthy fats.  Limit cheese to 1 oz (28 g) per day.  Eat more home-cooked food and less restaurant, buffet, and fast food.  Limit fried foods.  Cook foods using methods other than frying.  Limit canned vegetables. If you do use them, rinse them well to decrease the sodium.  When eating at a restaurant, ask that your food be prepared with less salt, or no salt  if possible. WHAT FOODS CAN I EAT? Seek help from a dietitian for individual calorie needs. Grains Whole grain or whole wheat bread. Brown rice. Whole grain or whole wheat pasta. Quinoa, bulgur, and whole grain cereals. Low-sodium cereals. Corn or whole wheat flour tortillas. Whole grain cornbread. Whole grain crackers. Low-sodium crackers. Vegetables Fresh or frozen vegetables (raw, steamed, roasted, or grilled). Low-sodium or reduced-sodium tomato and vegetable juices. Low-sodium or reduced-sodium tomato sauce and paste. Low-sodium or reduced-sodium canned vegetables.  Fruits All fresh, canned (in natural juice), or frozen fruits. Meat and Other Protein Products Ground beef (85% or leaner), grass-fed beef, or beef trimmed of  fat. Skinless chicken or [place]. Ground chicken or [place]. Pork trimmed of fat. All fish and seafood. Eggs. Dried beans, peas, or lentils. Unsalted nuts and seeds. Unsalted canned beans. Dairy Low-fat dairy products, such as skim or 1% milk, 2% or reduced-fat cheeses, low-fat ricotta or cottage cheese, or plain low-fat yogurt. Low-sodium or reduced-sodium cheeses. Fats and Oils Tub margarines without trans fats. Light or reduced-fat mayonnaise and salad dressings (reduced sodium). Avocado. Safflower, olive, or canola oils. Natural peanut or almond butter. Other Unsalted popcorn and pretzels. The items listed above may not be a complete list of recommended foods or beverages. Contact your dietitian for more options. WHAT FOODS ARE NOT RECOMMENDED? Grains White bread. White pasta. White rice. Refined cornbread. Bagels and croissants. Crackers that contain trans fat. Vegetables Creamed or fried vegetables. Vegetables in a cheese sauce. Regular canned vegetables. Regular canned tomato sauce and paste. Regular tomato and vegetable juices. Fruits Dried fruits. Canned fruit in light or heavy syrup. Fruit juice. Meat and Other Protein Products Fatty cuts of meat. Ribs,  chicken wings, bacon, sausage, bologna, salami, chitterlings, fatback, hot dogs, bratwurst, and packaged luncheon meats. Salted nuts and seeds. Canned beans with salt. Dairy Whole or 2% milk, cream, half-and-half, and cream cheese. Whole-fat or sweetened yogurt. Full-fat cheeses or blue cheese. Nondairy creamers and whipped toppings. Processed cheese, cheese spreads, or cheese curds. Condiments Onion and garlic salt, seasoned salt, table salt, and sea salt. Canned and packaged gravies. Worcestershire sauce. Tartar sauce. Barbecue sauce. Teriyaki sauce. Soy sauce, including reduced sodium. Steak sauce. Fish sauce. Oyster sauce. Cocktail sauce. Horseradish. Ketchup and mustard. Meat flavorings and tenderizers. Bouillon cubes. Hot sauce. Tabasco sauce. Marinades. Taco seasonings. Relishes. Fats and Oils Butter, stick margarine, lard, shortening, ghee, and bacon fat. Coconut, palm kernel, or palm oils. Regular salad dressings. Other Pickles and olives. Salted popcorn and pretzels. The items listed above may not be a complete list of foods and beverages to avoid. Contact your dietitian for more information. WHERE CAN I FIND MORE INFORMATION? National Heart, Lung, and Blood Institute: travelstabloid.com Document Released: 11/15/2011 Document Revised: 04/12/2014 Document Reviewed: 09/30/2013 [facility] Patient Information 2015 [place], [place]. This information is not intended to replace advice given to you by your health care provider. Make sure you discuss any questions you have with your health care provider.       Hydrochlorothiazide Capsules or Tablets What is this medicine? HYDROCHLOROTHIAZIDE (hye droe klor oh THYE a zide) is a diuretic. It helps you make more urine and to lose salt and excess water from your body. It treats swelling from heart, kidney, or liver disease. It also treats high blood pressure. This medicine may be used for other purposes; ask your  health care provider or pharmacist if you have questions. COMMON BRAND NAME(S): Esidrix, Ezide, HydroDIURIL, Microzide, Oretic, Zide What should I tell my health care provider before I take this medicine? They need to know if you have any of these conditions:  diabetes  gout  kidney disease  liver disease  lupus  pancreatitis  an unusual or allergic reaction to hydrochlorothiazide, sulfa drugs, other medicines, foods, dyes, or preservatives  pregnant or trying to get pregnant  breast-feeding How should I use this medicine? Take this medicine by mouth. Take it as directed on the prescription label at the same time every day. You can take it with or without food. If it upsets your stomach, take it with food. Keep taking it unless your health care provider tells you to stop. Talk  to your health care provider about the use of this medicine in children. While it may be prescribed for children as young as newborns for selected conditions, precautions do apply. Overdosage: If you think you have taken too much of this medicine contact a poison control center or emergency room at once. NOTE: This medicine is only for you. Do not share this medicine with others. What if I miss a dose? If you miss a dose, take it as soon as you can. If it is almost time for your next dose, take only that dose. Do not take double or extra doses. What may interact with this medicine?  cholestyramine  colestipol  digoxin  dofetilide  lithium  medicines for blood pressure  medicines for diabetes  medicines that relax muscles for surgery  other diuretics  steroid medicines like prednisone or cortisone This list may not describe all possible interactions. Give your health care provider a list of all the medicines, herbs, non-prescription drugs, or dietary supplements you use. Also tell them if you smoke, drink alcohol, or use illegal drugs. Some items may interact with your medicine. What should I  watch for while using this medicine? Visit your health care provider for regular check ups. Check your blood pressure as directed. Ask your health care provider what your blood pressure should be. Also, find out when you should contact him or her. Do not treat yourself for coughs, colds, or pain while you are using this medicine without asking your health care provider for advice. Some medicines may increase your blood pressure. You may get drowsy or dizzy. Do not drive, use machinery, or do anything that needs mental alertness until you know how this medicine affects you. Do not stand or sit up quickly, especially if you are an older patient. This reduces the risk of dizzy or fainting spells. Alcohol can make you more drowsy and dizzy. Avoid alcoholic drinks. Talk to your health care professional about your risk of skin cancer. You may be more at risk for skin cancer if you take this medicine. This medicine can make you more sensitive to the sun. Keep out of the sun. If you cannot avoid being in the sun, wear protective clothing and use sunscreen. Do not use sun lamps or tanning beds/booths. You may need to be on a special diet while taking this medicine. Ask your health care provider. Also, find out how many glasses of fluids you need to drink each day. Check with your health care provider if you get an attack of severe diarrhea, nausea and vomiting, or if you sweat a lot. The loss of too much body fluid can make it dangerous for you to take this medicine. This medicine may increase blood sugar. Ask your healthcare provider if changes in diet or medicines are needed if you have diabetes. What side effects may I notice from receiving this medicine? Side effects that you should report to your doctor or health care professional as soon as possible:  allergic reactions (skin rash, itching or hives; swelling of the face, lips, or tongue)  gout (severe pain, redness, or swelling in joints like the big  toe)  high blood sugar (increased hunger, thirst or urination; unusually weak or tired; blurry vision)  kidney injury (trouble passing urine or change in the amount of urine)  low blood pressure (dizziness; feeling faint or lightheaded, falls; unusually weak or tired)  low potassium levels (trouble breathing; chest pain; dizziness; fast, irregular heartbeat; feeling faint or lightheaded, falls;  muscle cramps or pain)  sudden change in vision or eye pain Side effects that usually do not require medical attention (report to your doctor or health care professional if they continue or are bothersome):  change in sex drive or performance  dry mouth  headache  stomach upset This list may not describe all possible side effects. Call your doctor for medical advice about side effects. You may report side effects to FDA at 1-800-FDA-1088. Where should I keep my medicine? Keep out of the reach of children and pets. Store at room temperature between 20 and 25 degrees C (68 and 77 degrees F). Protect from light and moisture. Keep the container tightly closed. Do not freeze. Get rid of any unused medicine after the expiration date. To get rid of medicines that are no longer needed or have expired:  Take the medicine to a medicine take-back program. Check with your pharmacy or law enforcement to find a location.  If you cannot return the medicine, check the label or package insert to see if the medicine should be thrown out in the garbage or flushed down the toilet. If you are not sure, ask your health care provider. If it is safe to put in the trash, empty the medicine out of the container. Mix the medicine with cat litter, dirt, coffee grounds, or other unwanted substance. Seal the mixture in a bag or container. Put it in the trash. NOTE: This sheet is a summary. It may not cover all possible information. If you have questions about this medicine, talk to your doctor, pharmacist, or health care  provider.  2021 [employer] (2020-10-05 17:16:00)

## 2021-02-07 ENCOUNTER — Other Ambulatory Visit: Payer: Self-pay | Admitting: Adult Health

## 2021-02-07 DIAGNOSIS — E1169 Type 2 diabetes mellitus with other specified complication: Secondary | ICD-10-CM

## 2021-02-07 DIAGNOSIS — E785 Hyperlipidemia, unspecified: Secondary | ICD-10-CM

## 2021-02-07 MED ORDER — SIMVASTATIN 40 MG PO TABS: ORAL_TABLET | ORAL | 3 refills | Status: DC

## 2021-02-08 ENCOUNTER — Encounter: Payer: Self-pay | Admitting: Adult Health

## 2021-02-08 DIAGNOSIS — E538 Deficiency of other specified B group vitamins: Secondary | ICD-10-CM | POA: Insufficient documentation

## 2021-02-08 LAB — CBC WITH DIFFERENTIAL/PLATELET
Absolute Monocytes: 715 cells/uL (ref 200–950)
Basophils Absolute: 88 cells/uL (ref 0–200)
Basophils Relative: 0.9 %
Eosinophils Absolute: 343 cells/uL (ref 15–500)
Eosinophils Relative: 3.5 %
HCT: 44.2 % (ref 38.5–50.0)
Hemoglobin: 15.1 g/dL (ref 13.2–17.1)
Lymphs Abs: 2920 cells/uL (ref 850–3900)
MCH: 30.3 pg (ref 27.0–33.0)
MCHC: 34.2 g/dL (ref 32.0–36.0)
MCV: 88.6 fL (ref 80.0–100.0)
MPV: 10.7 fL (ref 7.5–12.5)
Monocytes Relative: 7.3 %
Neutro Abs: 5733 cells/uL (ref 1500–7800)
Neutrophils Relative %: 58.5 %
Platelets: 300 10*3/uL (ref 140–400)
RBC: 4.99 10*6/uL (ref 4.20–5.80)
RDW: 12.4 % (ref 11.0–15.0)
Total Lymphocyte: 29.8 %
WBC: 9.8 10*3/uL (ref 3.8–10.8)

## 2021-02-08 LAB — URINALYSIS, ROUTINE W REFLEX MICROSCOPIC
Bilirubin Urine: NEGATIVE
Glucose, UA: NEGATIVE
Hgb urine dipstick: NEGATIVE
Ketones, ur: NEGATIVE
Leukocytes,Ua: NEGATIVE
Nitrite: NEGATIVE
Protein, ur: NEGATIVE
Specific Gravity, Urine: 1.006 (ref 1.001–1.03)
pH: 6.5 (ref 5.0–8.0)

## 2021-02-08 LAB — COMPLETE METABOLIC PANEL WITH GFR
AG Ratio: 1.6 (calc) (ref 1.0–2.5)
ALT: 24 U/L (ref 9–46)
AST: 19 U/L (ref 10–35)
Albumin: 4.3 g/dL (ref 3.6–5.1)
Alkaline phosphatase (APISO): 43 U/L (ref 35–144)
BUN: 16 mg/dL (ref 7–25)
CO2: 26 mmol/L (ref 20–32)
Calcium: 10 mg/dL (ref 8.6–10.3)
Chloride: 105 mmol/L (ref 98–110)
Creat: 0.89 mg/dL (ref 0.70–1.18)
GFR, Est African American: 98 mL/min/{1.73_m2} (ref >=60)
GFR, Est Non African American: 85 mL/min/{1.73_m2} (ref >=60)
Globulin: 2.7 g/dL (calc) (ref 1.9–3.7)
Glucose, Bld: 82 mg/dL (ref 65–99)
Potassium: 4.8 mmol/L (ref 3.5–5.3)
Sodium: 140 mmol/L (ref 135–146)
Total Bilirubin: 0.4 mg/dL (ref 0.2–1.2)
Total Protein: 7 g/dL (ref 6.1–8.1)

## 2021-02-08 LAB — TEST AUTHORIZATION

## 2021-02-08 LAB — LIPID PANEL
Cholesterol: 144 mg/dL (ref <200)
HDL: 33 mg/dL — ABNORMAL LOW (ref >=40)
LDL Cholesterol (Calc): 81 mg/dL (calc)
Non-HDL Cholesterol (Calc): 111 mg/dL (calc) (ref <130)
Total CHOL/HDL Ratio: 4.4 (calc) (ref <5.0)
Triglycerides: 201 mg/dL — ABNORMAL HIGH (ref <150)

## 2021-02-08 LAB — TSH: TSH: 3.17 mIU/L (ref 0.40–4.50)

## 2021-02-08 LAB — MICROALBUMIN / CREATININE URINE RATIO
Creatinine, Urine: 29 mg/dL (ref 20–320)
Microalb Creat Ratio: 10 mcg/mg creat (ref <30)
Microalb, Ur: 0.3 mg/dL

## 2021-02-08 LAB — PSA: PSA: 1.26 ng/mL (ref <=4.0)

## 2021-02-08 LAB — HEMOGLOBIN A1C
Hgb A1c MFr Bld: 6.4 % of total Hgb — ABNORMAL HIGH (ref <5.7)
Mean Plasma Glucose: 137 mg/dL
eAG (mmol/L): 7.6 mmol/L

## 2021-02-08 LAB — VITAMIN B12: Vitamin B-12: 228 pg/mL (ref 200–1100)

## 2021-02-08 LAB — MAGNESIUM: Magnesium: 2.1 mg/dL (ref 1.5–2.5)

## 2021-02-08 LAB — VITAMIN D 25 HYDROXY (VIT D DEFICIENCY, FRACTURES): Vit D, 25-Hydroxy: 84 ng/mL (ref 30–100)

## 2021-02-12 ENCOUNTER — Other Ambulatory Visit: Payer: Self-pay | Admitting: Adult Health

## 2021-02-12 ENCOUNTER — Other Ambulatory Visit: Payer: Self-pay | Admitting: Internal Medicine

## 2021-02-12 DIAGNOSIS — I10 Essential (primary) hypertension: Secondary | ICD-10-CM

## 2021-02-12 DIAGNOSIS — E1169 Type 2 diabetes mellitus with other specified complication: Secondary | ICD-10-CM

## 2021-02-13 ENCOUNTER — Other Ambulatory Visit: Payer: Self-pay | Admitting: Adult Health

## 2021-02-13 DIAGNOSIS — E1169 Type 2 diabetes mellitus with other specified complication: Secondary | ICD-10-CM

## 2021-02-13 DIAGNOSIS — E785 Hyperlipidemia, unspecified: Secondary | ICD-10-CM

## 2021-02-14 ENCOUNTER — Other Ambulatory Visit: Payer: Self-pay | Admitting: Adult Health

## 2021-02-14 DIAGNOSIS — E1169 Type 2 diabetes mellitus with other specified complication: Secondary | ICD-10-CM

## 2021-02-14 DIAGNOSIS — E785 Hyperlipidemia, unspecified: Secondary | ICD-10-CM

## 2021-02-14 MED ORDER — SIMVASTATIN 40 MG PO TABS: ORAL_TABLET | ORAL | 3 refills | Status: DC

## 2021-03-17 ENCOUNTER — Other Ambulatory Visit: Payer: Self-pay | Admitting: Adult Health

## 2021-03-17 DIAGNOSIS — E1122 Type 2 diabetes mellitus with diabetic chronic kidney disease: Secondary | ICD-10-CM

## 2021-03-17 MED ORDER — GLIMEPIRIDE 2 MG PO TABS: ORAL_TABLET | ORAL | 3 refills | Status: DC

## 2021-03-23 ENCOUNTER — Encounter: Payer: Self-pay | Admitting: Adult Health

## 2021-03-23 ENCOUNTER — Other Ambulatory Visit: Payer: Self-pay | Admitting: Adult Health

## 2021-03-23 MED ORDER — OLMESARTAN MEDOXOMIL 40 MG PO TABS: 40.0000 mg | ORAL_TABLET | Freq: Every day | ORAL | 3 refills | Status: DC

## 2021-03-23 NOTE — Progress Notes (Signed)
Future Appointments  Date Time Provider [place]  05/10/2021 11:30 AM Liane Comber, NP [facility] None  08/21/2021 10:30 AM Unk Pinto, MD [facility] None  10/19/2021 11:00 AM Liane Comber, NP [facility] None  02/06/2022  3:00 PM Liane Comber, NP [facility] None   Spoke with patient on phone re: poorly controlled BP; previously on losartan 100 mg daily, HCTZ 12. 5 mg daily was added, reports did not note improvement with this. Discussed options; will stop losartan/hctz and send in olmesartan 40 mg to start taking instead. He will message back with home values via mychart in 1-2 weeks; keep 6 week follow up appointment as scheduled and plan labs and in office BP recheck at that time.

## 2021-05-01 ENCOUNTER — Other Ambulatory Visit: Payer: Self-pay | Admitting: Adult Health

## 2021-05-09 NOTE — Progress Notes (Signed)
MEDICARE ANNUAL WELLNESS VISIT AND FOLLOW UP Assessment:    Encounter for annual medicare wellness visit Due annually   Benign essential HTN Continue medication Monitor blood pressure at home; call if consistently over 130/80 Continue DASH diet.   Reminder to go to the ER if any CP, SOB, nausea, dizziness, severe HA, changes vision/speech, left arm numbness and tingling and jaw pain. -     CBC with Differential/Platelet -     COMPLETE METABOLIC PANEL WITH GFR -     Magnesium  Aortic atherosclerosis ([place]) Per CT 2018 Control blood pressure, cholesterol, glucose, increase exercise.  -     Lipid panel  Type 2 diabetes mellitus with stage 2 chronic kidney disease, without long-term current use of insulin [facility]) Education: Reviewed 'ABCs' of diabetes management (respective goals in parentheses):  A1C (<7), blood pressure (<130/80), and cholesterol (LDL <70) Eye Exam yearly and Dental Exam every 6 months. Dietary recommendations Physical Activity recommendations -     COMPLETE METABOLIC PANEL WITH GFR -     Hemoglobin A1c  Hyperlipidemia associated with type 2 diabetes mellitus ([place]) Continue statin; titrate for LDL goal <70 Continue low cholesterol diet and exercise.  Check lipid panel.  -     Lipid panel -     TSH  CKD stage 2 due to type 2 diabetes mellitus (HCC) Increase fluids, avoid NSAIDS, monitor sugars, will monitor -     COMPLETE METABOLIC PANEL WITH GFR  Diabetic mononeuropathy associated with type 2 diabetes mellitus ([place]) Declines medications at this time, discussed feet checks.   ED associated with T2DM ([place]) Declines meds  Diabetc retinopathy [facility]) Ophthalmology follows; control sugars  Osteoarthritis of knee, unspecified laterality, unspecified osteoarthritis type Celebrex, tylenol PRN; establisehd with ortho to follow up if needed  Lumbar spondylosis Est with Dr. Ellene Route, managing with celebrex Declines other interventions at this time [place] loss  encouraged  Vitamin D deficiency At goal at recent check; continue to recommend supplementation for goal of 60-100 Defer vitamin D level  Morbid obesity - BMI 35+ with comorbidities (T2DM, thn, hyperlipidemia) Excellent progress over the last year with lifestyle modification;  Long discussion about weight loss, diet, and exercise Discussed ideal weight for height and initial weight goal (<245lb) Patient will work on increasing exercise, discussed water aerobics  Will follow up q40m  Heart murmur Monitor; no concerning sx    Over 30 minutes of exam, counseling, chart review, and critical decision making was performed  Future Appointments  Date Time Provider [place]  08/21/2021 10:30 AM Unk Pinto, MD [facility] None  02/06/2022  3:00 PM Liane Comber, NP [facility] None  05/10/2022 11:30 AM Liane Comber, NP [facility] None     Plan:   During the course of the visit the patient was educated and counseled about appropriate screening and preventive services including:    Pneumococcal vaccine   Influenza vaccine  Prevnar 13  Td vaccine  Screening electrocardiogram  Colorectal cancer screening  Diabetes screening  Glaucoma screening  Nutrition counseling    Subjective:  Joshua Koch is a 75 y.o. male who presents for Medicare Annual Wellness Visit and 3 month follow up for HTN, hyperlipidemia, T2DM with CKD, and vitamin D Def.   He has chronic lumbar pain with intermittent bilateral anterior thigh numbness, has been referred to Dr. Ellene Route, did PT but this made it worse, quit after 6 sessions, doing home exercises, taking celebrex 200 mg. Was discussing further imaging/MRI and injections but doing well and holding off  for now. He would prefer to avoid surgery, feels managing.   BMI is Body mass index is 35.43 kg/m., he has been working on diet, exercise limited due to back pain and knee (follows ortho). He has been prescribed phentermine/topamax in  the past but stopped due to not liking how he felt with medications. Has been doing well reducing portions of starches, etc. Weight is down from 299 lb in 2019.  He has been cutting down on sweets, admits loves food, meat and potatoes  - "I think I have a food addiction." Eats for flavor, not for hunger -  Have discussed ozempic, would consider but has declined thus far, continues to decline today Wt Readings from Last 3 Encounters:  05/10/21 254 lb (115.2 kg)  02/06/21 257 lb 12.8 oz (116.9 kg)  10/19/20 254 lb (115.2 kg)   His blood pressure has been controlled at home, today their BP is BP: 138/70 He does not workout. He denies chest pain, shortness of breath, dizziness.   He has aortic atherosclerosis per Ct 2018  He is on cholesterol medication (simvastatin 20 mg daily) and denies myalgias. His cholesterol is not at goal. The cholesterol last visit was:   Lab Results  Component Value Date   CHOL 144 02/06/2021   HDL 33 (L) 02/06/2021   LDLCALC 81 02/06/2021   TRIG 201 (H) 02/06/2021   CHOLHDL 4.4 02/06/2021   He has been working on diet and exercise for T2 diabetes with CKD II (on metformin 2000 mg, glimepiride 1/2 tab 2 mg BID - dose reduced to assist with weight loss), he is on bASA, he is on ACE/ARB and denies increased appetite, nausea, polydipsia, polyuria, visual disturbances and vomiting. He does have neuropathy, very poor sensation in bil feet. Has glucometer but doesn't check regularly.  He had normal ABI 08/2020. Mild retinopathy per Dr. Prudencio Burly, last 01/19/2021. He has had ED for many years; numerous medications didn't help and declines further.  Discussed ozempic or trulicity - would consider but declines for now Has glucometer but doesn't check regularly.  Last A1C in the office was:  Lab Results  Component Value Date   HGBA1C 6.4 (H) 02/06/2021   Last GFR Lab Results  Component Value Date   GFRNONAA 85 02/06/2021    Patient is on Vitamin D supplement.   Lab Results   Component Value Date   VD25OH 84 02/06/2021      Medication Review:  Current Outpatient Medications (Endocrine & Metabolic):  .  metFORMIN (GLUCOPHAGE-XR) 500 MG 24 hr tablet, TAKE 2 TABLETS BY MOUTH 2  TIMES PER DAY WITH MEALS  FOR DIABETES .  glimepiride (AMARYL) 1 MG tablet, Take 1 tablet 2 x /day with Meals for Diabetes  Current Outpatient Medications (Cardiovascular):  .  olmesartan (BENICAR) 40 MG tablet, Take 1 tablet (40 mg total) by mouth daily. .  simvastatin (ZOCOR) 20 MG tablet, TAKE 1 TABLET BY MOUTH AT  BEDTIME FOR CHOLESTEROL  Current Outpatient Medications (Respiratory):  .  cetirizine (ZYRTEC) 10 MG tablet, Take 10 mg by mouth daily.  Current Outpatient Medications (Analgesics):  .  aspirin (ECOTRIN LOW STRENGTH) 81 MG EC tablet, Take 1 tablet daily .  Celecoxib (CELEBREX PO), Take 200 mg by mouth daily. Daily PRN back pain   Current Outpatient Medications (Other):  Marland Kitchen  APPLE CIDER VINEGAR PO, Take by mouth daily. .  Ascorbic Acid (VITAMIN C PO), Take by mouth. 1 tablet daily .  CHOLECALCIFEROL PO, Take 10,000 Units by mouth  daily. .  Magnesium 400 MG CAPS, Take 1 capsule by mouth 2 (two) times daily. .  Omega-3 Fatty Acids (FISH OIL PO), Take by mouth 2 (two) times daily. .  Zinc 50 MG TABS, Take by mouth daily.  Allergies: Allergies  Allergen Reactions  . 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  Screening Tests Immunization History  Administered Date(s) Administered  . PFIZER(Purple Top)SARS-COV-2 Vaccination 01/01/2020, 01/22/2020, 09/07/2020  . Pneumococcal-Unspecified 05/05/2009, 03/03/2013  . Td 05/05/2009  . Tdap 06/21/2015  . Zoster, Live 03/03/2013   Preventative care: Last colonoscopy: Dr Earlean Shawl, 12/24/2019 due 2031  Prior vaccinations: TD or Tdap: 2016  Influenza: Declines  Pneumococcal: 2014 Prevnar13: Declines Zostavax: 02/2013, declines shingrix Covid 19: 3/3, 2021, pfizer   Names of Other Physician/Practitioners you currently use: 1. [facility] here for primary care 2. Eye Exam, Dr. Prudencio Burly,  Diabetic 01/19/2021, mild retinopathy  3. Dental Exam, Dr. Jone Baseman, last in 2021 4. Derm, Dr. 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 Family His family history includes CAD (age of onset: 1) in his brother; COPD in his brother; Heart attack (age of onset: 25) in his father; Lung cancer in his sister; Stroke in his brother. Social history  He reports that he has never smoked. He has never used smokeless tobacco. He reports current alcohol use of about 1.0 standard drink of alcohol per week. He reports that he does not use drugs.  MEDICARE WELLNESS OBJECTIVES: Physical activity: Current Exercise Habits: The patient does not participate in regular exercise at present, Exercise limited by: neurologic condition(s) Cardiac risk factors: Cardiac Risk Factors include: advanced age (>62men, >30 women);dyslipidemia;hypertension;male gender;obesity (BMI >30kg/m2);sedentary lifestyle;diabetes  mellitus Depression/mood screen:   Depression screen [facility] 2/9 05/10/2021  Decreased Interest 0  Down, Depressed, Hopeless 1  PHQ - 2 Score 1    ADLs:  In your present state of health, do you have any difficulty performing the following activities: 05/10/2021 10/19/2020  Hearing? N Y  Vision? N N  Difficulty concentrating or making decisions? N N  Walking or climbing stairs? N N  Comment - can manage slowly  Dressing or bathing? N N  Doing errands, shopping? N N  Some recent data might be hidden     Cognitive Testing  Alert? Yes  Normal Appearance?Yes  Oriented to person? Yes  Place? Yes   Time? Yes  Recall of three objects?  Yes  Can perform simple calculations? Yes  Displays appropriate judgment?Yes  Can read the correct time from a watch face?Yes  EOL planning: Does Patient Have a Medical Advance Directive?: No Would patient like information on creating a medical advance directive?: No - Patient declined   Objective:   Today's Vitals   05/10/21 1121  05/10/21 1216  BP: (!) 142/68 138/70  Pulse: 74   Temp: (!) 97.3 F (36.3 C)   SpO2: 98%   Weight: 254 lb (115.2 kg)   Height: 5\' 11"  (1.803 m)    Body mass index is 35.43 kg/m.  General appearance: alert, no distress, WD/WN, male HEENT: normocephalic, sclerae anicteric, TMs pearly, nares patent, no discharge or erythema, pharynx normal Oral cavity: MMM, no lesions Neck: supple, no lymphadenopathy, no thyromegaly, no masses Heart: RRR, normal S1, S2, no murmurs Lungs: CTA bilaterally, no wheezes, rhonchi, or rales Abdomen: +bs, soft, obese, non tender, non distended, no masses, no hepatomegaly, no splenomegaly Musculoskeletal: nontender, no swelling, no obvious deformity Extremities: no edema, no cyanosis, no clubbing Pulses: 2+ symmetric, upper and lower extremities, normal cap refill Neurological: alert, oriented x 3, CN2-12 intact, strength normal upper extremities and lower extremities, sensation diminished bil  feet, DTRs 2+ throughout, no cerebellar signs, gait steady Psychiatric: normal affect, behavior normal, pleasant   Medicare Attestation I have personally reviewed: The patient's medical and social history Their use of alcohol, tobacco or illicit drugs Their current medications and supplements The patient's functional ability including ADLs,fall risks, home safety risks, cognitive, and hearing and visual impairment Diet and physical activities Evidence for depression or mood disorders  The patient's weight, height, BMI, and visual acuity have been recorded in the chart.  I have made referrals, counseling, and provided education to the patient based on review of the above and I have provided the patient with a written personalized care plan for preventive services.     Izora Ribas, NP   05/10/2021

## 2021-05-10 ENCOUNTER — Other Ambulatory Visit: Payer: Self-pay

## 2021-05-10 ENCOUNTER — Other Ambulatory Visit: Payer: Self-pay | Admitting: Adult Health

## 2021-05-10 ENCOUNTER — Encounter: Payer: Self-pay | Admitting: Adult Health

## 2021-05-10 ENCOUNTER — Ambulatory Visit (INDEPENDENT_AMBULATORY_CARE_PROVIDER_SITE_OTHER): Payer: Medicare Other | Admitting: Adult Health

## 2021-05-10 VITALS — BP 138/70 | HR 74 | Temp 97.3°F | Ht 71.0 in | Wt 254.0 lb

## 2021-05-10 DIAGNOSIS — M179 Osteoarthritis of knee, unspecified: Secondary | ICD-10-CM

## 2021-05-10 DIAGNOSIS — I10 Essential (primary) hypertension: Secondary | ICD-10-CM

## 2021-05-10 DIAGNOSIS — M1710 Unilateral primary osteoarthritis, unspecified knee: Secondary | ICD-10-CM

## 2021-05-10 DIAGNOSIS — R6889 Other general symptoms and signs: Secondary | ICD-10-CM

## 2021-05-10 DIAGNOSIS — E1169 Type 2 diabetes mellitus with other specified complication: Secondary | ICD-10-CM | POA: Diagnosis not present

## 2021-05-10 DIAGNOSIS — E1122 Type 2 diabetes mellitus with diabetic chronic kidney disease: Secondary | ICD-10-CM

## 2021-05-10 DIAGNOSIS — M47816 Spondylosis without myelopathy or radiculopathy, lumbar region: Secondary | ICD-10-CM

## 2021-05-10 DIAGNOSIS — E113299 Type 2 diabetes mellitus with mild nonproliferative diabetic retinopathy without macular edema, unspecified eye: Secondary | ICD-10-CM

## 2021-05-10 DIAGNOSIS — Z0001 Encounter for general adult medical examination with abnormal findings: Secondary | ICD-10-CM | POA: Diagnosis not present

## 2021-05-10 DIAGNOSIS — E538 Deficiency of other specified B group vitamins: Secondary | ICD-10-CM

## 2021-05-10 DIAGNOSIS — I700 Atherosclerosis of aorta: Secondary | ICD-10-CM | POA: Diagnosis not present

## 2021-05-10 DIAGNOSIS — Z724 Inappropriate diet and eating habits: Secondary | ICD-10-CM

## 2021-05-10 DIAGNOSIS — N182 Chronic kidney disease, stage 2 (mild): Secondary | ICD-10-CM

## 2021-05-10 DIAGNOSIS — E1141 Type 2 diabetes mellitus with diabetic mononeuropathy: Secondary | ICD-10-CM

## 2021-05-10 DIAGNOSIS — Z0000 Encounter for general adult medical examination without abnormal findings: Secondary | ICD-10-CM

## 2021-05-10 DIAGNOSIS — E559 Vitamin D deficiency, unspecified: Secondary | ICD-10-CM

## 2021-05-10 MED ORDER — GLIMEPIRIDE 1 MG PO TABS: ORAL_TABLET | ORAL | 3 refills | Status: DC

## 2021-05-10 NOTE — Patient Instructions (Addendum)
Joshua Koch , Thank you for taking time to come for your Medicare Wellness Visit. I appreciate your ongoing commitment to your health goals. Please review the following plan we discussed and let me know if I can assist you in the future.   These are the goals we discussed: Goals    . Blood Pressure < 130/80    . DIET - INCREASE WATER INTAKE     65-80+ fluid ounces daily     . HEMOGLOBIN A1C < 7    . LDL CALC < 70    . Weight (lb) < 220 lb (99.8 kg)       This is a list of the screening recommended for you and due dates:  Health Maintenance  Topic Date Due  . Zoster (Shingles) Vaccine (1 of 2) 08/10/2021*  . Flu Shot  07/10/2021  . Hemoglobin A1C  08/06/2021  . Eye exam for diabetics  01/19/2022  . Complete foot exam   02/06/2022  . Tetanus Vaccine  06/20/2025  . Colon Cancer Screening  12/23/2029  . COVID-19 Vaccine  Completed  . Hepatitis C Screening: USPSTF Recommendation to screen - Ages 55-79 yo.  Completed  . HPV Vaccine  Aged Out  . Pneumonia vaccines  Discontinued  *Topic was postponed. The date shown is not the original due date.    Try adding a fiber supplement - daily  If that's not helping add miralax daily as needed      Constipation, Adult Constipation is when a person has trouble pooping (having a bowel movement). When you have this condition, you may poop fewer than 3 times a week. Your poop (stool) may also be dry, hard, or bigger than normal. Follow these instructions at home: Eating and drinking  Eat foods that have a lot of fiber, such as: ? Fresh fruits and vegetables. ? Whole grains. ? Beans.  Eat less of foods that are low in fiber and high in fat and sugar, such as: ? [place] fries. ? Hamburgers. ? Cookies. ? Candy. ? Soda.  Drink enough fluid to keep your pee (urine) pale yellow.   General instructions  Exercise regularly or as told by your doctor. Try to do 150 minutes of exercise each week.  Go to the restroom when you feel like you  need to poop. Do not hold it in.  Take over-the-counter and prescription medicines only as told by your doctor. These include any fiber supplements.  When you poop: ? Do deep breathing while relaxing your lower belly (abdomen). ? Relax your pelvic floor. The pelvic floor is a group of muscles that support the rectum, bladder, and intestines (as well as the uterus in women).  Watch your condition for any changes. Tell your doctor if you notice any.  Keep all follow-up visits as told by your doctor. This is important. Contact a doctor if:  You have pain that gets worse.  You have a fever.  You have not pooped for 4 days.  You vomit.  You are not hungry.  You lose weight.  You are bleeding from the opening of the butt (anus).  You have thin, pencil-like poop. Get help right away if:  You have a fever, and your symptoms suddenly get worse.  You leak poop or have blood in your poop.  Your belly feels hard or bigger than normal (bloated).  You have very bad belly pain.  You feel dizzy or you faint. Summary  Constipation is when a person poops fewer  than 3 times a week, has trouble pooping, or has poop that is dry, hard, or bigger than normal.  Eat foods that have a lot of fiber.  Drink enough fluid to keep your pee (urine) pale yellow.  Take over-the-counter and prescription medicines only as told by your doctor. These include any fiber supplements. This information is not intended to replace advice given to you by your health care provider. Make sure you discuss any questions you have with your health care provider. Document Revised: 10/14/2019 Document Reviewed: 10/14/2019 Elsevier Patient Education  [place].

## 2021-05-11 ENCOUNTER — Other Ambulatory Visit: Payer: Self-pay | Admitting: Adult Health

## 2021-05-11 DIAGNOSIS — E785 Hyperlipidemia, unspecified: Secondary | ICD-10-CM

## 2021-05-11 DIAGNOSIS — E1169 Type 2 diabetes mellitus with other specified complication: Secondary | ICD-10-CM

## 2021-05-11 LAB — CBC WITH DIFFERENTIAL/PLATELET
Absolute Monocytes: 569 cells/uL (ref 200–950)
Basophils Absolute: 62 cells/uL (ref 0–200)
Basophils Relative: 0.8 %
Eosinophils Absolute: 172 cells/uL (ref 15–500)
Eosinophils Relative: 2.2 %
HCT: 45 % (ref 38.5–50.0)
Hemoglobin: 14.6 g/dL (ref 13.2–17.1)
Lymphs Abs: 2231 cells/uL (ref 850–3900)
MCH: 29.3 pg (ref 27.0–33.0)
MCHC: 32.4 g/dL (ref 32.0–36.0)
MCV: 90.2 fL (ref 80.0–100.0)
MPV: 10.1 fL (ref 7.5–12.5)
Monocytes Relative: 7.3 %
Neutro Abs: 4766 cells/uL (ref 1500–7800)
Neutrophils Relative %: 61.1 %
Platelets: 287 10*3/uL (ref 140–400)
RBC: 4.99 10*6/uL (ref 4.20–5.80)
RDW: 12.3 % (ref 11.0–15.0)
Total Lymphocyte: 28.6 %
WBC: 7.8 10*3/uL (ref 3.8–10.8)

## 2021-05-11 LAB — COMPLETE METABOLIC PANEL WITH GFR
AG Ratio: 1.7 (calc) (ref 1.0–2.5)
ALT: 18 U/L (ref 9–46)
AST: 18 U/L (ref 10–35)
Albumin: 4.4 g/dL (ref 3.6–5.1)
Alkaline phosphatase (APISO): 41 U/L (ref 35–144)
BUN: 23 mg/dL (ref 7–25)
CO2: 24 mmol/L (ref 20–32)
Calcium: 9.5 mg/dL (ref 8.6–10.3)
Chloride: 105 mmol/L (ref 98–110)
Creat: 0.98 mg/dL (ref 0.70–1.18)
GFR, Est African American: 88 mL/min/{1.73_m2} (ref >=60)
GFR, Est Non African American: 76 mL/min/{1.73_m2} (ref >=60)
Globulin: 2.6 g/dL (calc) (ref 1.9–3.7)
Glucose, Bld: 99 mg/dL (ref 65–99)
Potassium: 5.1 mmol/L (ref 3.5–5.3)
Sodium: 138 mmol/L (ref 135–146)
Total Bilirubin: 0.3 mg/dL (ref 0.2–1.2)
Total Protein: 7 g/dL (ref 6.1–8.1)

## 2021-05-11 LAB — LIPID PANEL
Cholesterol: 132 mg/dL (ref <200)
HDL: 36 mg/dL — ABNORMAL LOW (ref >=40)
LDL Cholesterol (Calc): 76 mg/dL (calc)
Non-HDL Cholesterol (Calc): 96 mg/dL (calc) (ref <130)
Total CHOL/HDL Ratio: 3.7 (calc) (ref <5.0)
Triglycerides: 118 mg/dL (ref <150)

## 2021-05-11 LAB — TSH: TSH: 2.67 mIU/L (ref 0.40–4.50)

## 2021-05-11 LAB — HEMOGLOBIN A1C
Hgb A1c MFr Bld: 6.6 % of total Hgb — ABNORMAL HIGH (ref <5.7)
Mean Plasma Glucose: 143 mg/dL
eAG (mmol/L): 7.9 mmol/L

## 2021-05-11 LAB — MAGNESIUM: Magnesium: 2.2 mg/dL (ref 1.5–2.5)

## 2021-05-11 MED ORDER — SIMVASTATIN 40 MG PO TABS: ORAL_TABLET | ORAL | 3 refills | Status: DC

## 2021-06-13 DIAGNOSIS — M47816 Spondylosis without myelopathy or radiculopathy, lumbar region: Secondary | ICD-10-CM | POA: Diagnosis not present

## 2021-06-13 DIAGNOSIS — M545 Low back pain, unspecified: Secondary | ICD-10-CM | POA: Diagnosis not present

## 2021-06-15 ENCOUNTER — Other Ambulatory Visit: Payer: Self-pay | Admitting: Physical Medicine and Rehabilitation

## 2021-06-15 DIAGNOSIS — M5136 Other intervertebral disc degeneration, lumbar region: Secondary | ICD-10-CM

## 2021-06-15 DIAGNOSIS — M545 Low back pain, unspecified: Secondary | ICD-10-CM

## 2021-06-25 ENCOUNTER — Ambulatory Visit
Admission: RE | Admit: 2021-06-25 | Discharge: 2021-06-25 | Disposition: A | Discharge status: Home / Self Care | Payer: Medicare Other | Source: Ambulatory Visit | Attending: Physical Medicine and Rehabilitation | Admitting: Physical Medicine and Rehabilitation

## 2021-06-25 DIAGNOSIS — M545 Low back pain, unspecified: Secondary | ICD-10-CM | POA: Diagnosis not present

## 2021-06-25 DIAGNOSIS — M48061 Spinal stenosis, lumbar region without neurogenic claudication: Secondary | ICD-10-CM | POA: Diagnosis not present

## 2021-06-25 DIAGNOSIS — M5136 Other intervertebral disc degeneration, lumbar region: Secondary | ICD-10-CM

## 2021-07-03 DIAGNOSIS — M47816 Spondylosis without myelopathy or radiculopathy, lumbar region: Secondary | ICD-10-CM | POA: Diagnosis not present

## 2021-07-13 DIAGNOSIS — M47816 Spondylosis without myelopathy or radiculopathy, lumbar region: Secondary | ICD-10-CM | POA: Diagnosis not present

## 2021-07-19 ENCOUNTER — Other Ambulatory Visit (HOSPITAL_BASED_OUTPATIENT_CLINIC_OR_DEPARTMENT_OTHER): Payer: Self-pay

## 2021-07-19 ENCOUNTER — Ambulatory Visit: Payer: Medicare Other | Attending: Internal Medicine

## 2021-07-19 DIAGNOSIS — Z23 Encounter for immunization: Secondary | ICD-10-CM

## 2021-07-19 MED ORDER — PFIZER-BIONT COVID-19 VAC-TRIS 30 MCG/0.3ML IM SUSP
INTRAMUSCULAR | 0 refills | Status: DC
Start: 2021-07-19 — End: 2021-10-10
  Filled 2021-07-19: qty 0.3, 1d supply, fill #0

## 2021-07-19 NOTE — Progress Notes (Signed)
   Covid-19 Vaccination Clinic  Name:  PRATHER LINENBERGER    MRN: [id number] DOB: 10-23-1946  07/19/2021  Mr. Schwieterman was observed post Covid-19 immunization for 15 minutes without incident. He was provided with Vaccine Information Sheet and instruction to access the V-Safe system.   Mr. Rodi was instructed to call 911 with any severe reactions post vaccine: Difficulty breathing  Swelling of face and throat  A fast heartbeat  A bad rash all over body  Dizziness and weakness   Immunizations Administered     Name Date Dose VIS Date Route   PFIZER Comrnaty(Gray TOP) Covid-19 Vaccine 07/19/2021 10:33 AM 0.3 mL 11/17/2020 Intramuscular   Manufacturer: [employer]   Lot: [id number]   [place]: [phone]

## 2021-07-27 DIAGNOSIS — M47816 Spondylosis without myelopathy or radiculopathy, lumbar region: Secondary | ICD-10-CM | POA: Diagnosis not present

## 2021-08-21 ENCOUNTER — Ambulatory Visit: Payer: Medicare Other | Admitting: Internal Medicine

## 2021-08-28 ENCOUNTER — Other Ambulatory Visit (HOSPITAL_BASED_OUTPATIENT_CLINIC_OR_DEPARTMENT_OTHER): Payer: Self-pay

## 2021-08-28 MED ORDER — TETANUS-DIPHTH-ACELL PERTUSSIS 5-2.5-18.5 LF-MCG/0.5 IM SUSY
PREFILLED_SYRINGE | INTRAMUSCULAR | 0 refills | Status: DC
  Filled 2021-08-28: qty 0.5, 1d supply, fill #0

## 2021-08-30 ENCOUNTER — Encounter: Payer: Self-pay | Admitting: Internal Medicine

## 2021-08-30 ENCOUNTER — Ambulatory Visit (INDEPENDENT_AMBULATORY_CARE_PROVIDER_SITE_OTHER): Payer: Medicare Other | Admitting: Internal Medicine

## 2021-08-30 ENCOUNTER — Other Ambulatory Visit: Payer: Self-pay

## 2021-08-30 DIAGNOSIS — J300 Vasomotor rhinitis: Secondary | ICD-10-CM | POA: Diagnosis not present

## 2021-08-30 DIAGNOSIS — G4733 Obstructive sleep apnea (adult) (pediatric): Secondary | ICD-10-CM

## 2021-08-30 DIAGNOSIS — E785 Hyperlipidemia, unspecified: Secondary | ICD-10-CM | POA: Diagnosis not present

## 2021-08-30 DIAGNOSIS — I700 Atherosclerosis of aorta: Secondary | ICD-10-CM

## 2021-08-30 DIAGNOSIS — E1122 Type 2 diabetes mellitus with diabetic chronic kidney disease: Secondary | ICD-10-CM | POA: Diagnosis not present

## 2021-08-30 DIAGNOSIS — Z79899 Other long term (current) drug therapy: Secondary | ICD-10-CM

## 2021-08-30 DIAGNOSIS — N182 Chronic kidney disease, stage 2 (mild): Secondary | ICD-10-CM | POA: Diagnosis not present

## 2021-08-30 DIAGNOSIS — I10 Essential (primary) hypertension: Secondary | ICD-10-CM

## 2021-08-30 DIAGNOSIS — Z9989 Dependence on other enabling machines and devices: Secondary | ICD-10-CM

## 2021-08-30 DIAGNOSIS — E559 Vitamin D deficiency, unspecified: Secondary | ICD-10-CM

## 2021-08-30 DIAGNOSIS — E1169 Type 2 diabetes mellitus with other specified complication: Secondary | ICD-10-CM

## 2021-08-30 MED ORDER — IPRATROPIUM BROMIDE 0.06 % NA SOLN: NASAL | 3 refills | Status: DC

## 2021-08-30 NOTE — Patient Instructions (Signed)

## 2021-08-30 NOTE — Progress Notes (Signed)
Future Appointments  Date Time Provider [place]  08/30/2021  2:30 PM Unk Pinto, MD [facility] None  02/06/2022   - CPE   3:00 PM Liane Comber, NP [facility] None  05/10/2022      - Wellness 11:30 AM Liane Comber, NP [facility] None    History of Present Illness:       This very nice 75 y.o. MWM presents for 3 month follow up with HTN, HLD, T2_NIDDM and Vitamin D Deficiency. CT scan in 2018 revealed Aortic Atherosclerosis. Patient is on CPAP for OSA with improved restorative sleep        Patient is treated for HTN  (1995) & BP has been controlled at home. Today's BP is at goal - 138/84. Patient has had no complaints of any cardiac type chest pain, palpitations, dyspnea / orthopnea / PND, dizziness, claudication, or dependent edema.       Hyperlipidemia is controlled with diet & meds. Patient denies myalgias or other med SE's. 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  Last A1c was not at goal:  Lab Results  Component Value Date   HGBA1C 6.6 (H) 05/10/2021        Further, the patient also has history of Vitamin D Deficiency  ("29" /2019) and supplements vitamin D without any suspected side-effects. Last vitamin D was at goal:   Lab Results  Component Value Date   VD25OH 84 02/06/2021     Current Outpatient Medications on File Prior to Visit  Medication Sig   APPLE CIDER VINEGAR PO Take by mouth daily.   Ascorbic Acid (VITAMIN C PO) Take by mouth. 1 tablet daily   aspirin (ECOTRIN LOW STRENGTH) 81 MG EC tablet Take 1 tablet daily   Celecoxib (CELEBREX PO) Take 200 mg by mouth daily. Daily PRN back pain   cetirizine (ZYRTEC) 10 MG tablet  Take 10 mg by mouth daily.   CHOLECALCIFEROL PO Take 10,000 Units by mouth daily.   COVID-19 mRNA Vac-TriS, Pfizer, (PFIZER-BIONT COVID-19 VAC-TRIS) SUSP injection Inject into the muscle.   glimepiride (AMARYL) 1 MG tablet Take 1 tablet 2 x /day with Meals for Diabetes   Magnesium 400 MG CAPS Take 1 capsule by mouth 2 (two) times daily.   metFORMIN (GLUCOPHAGE-XR) 500 MG 24 hr tablet Take  2 tablets  2 x /day  with Meals  for Diabetes   olmesartan (BENICAR) 40 MG tablet Take 1 tablet (40 mg total) by mouth daily.   Omega-3 Fatty Acids (FISH OIL PO) Take by mouth 2 (two) times daily.   simvastatin (ZOCOR) 40 MG tablet TAKE 1 TABLET BY MOUTH AT  BEDTIME FOR CHOLESTEROL   Tdap (BOOSTRIX) 5-2.5-18.5 LF-MCG/0.5 injection Inject into the muscle.   Zinc 50 MG TABS Take by mouth daily.     Allergies  Allergen Reactions   No Known Allergies      PMHx:   Past Medical History:  Diagnosis Date   Arthritis    Asthma    childhood   Cancer ([place])    basal cell face and back   Diabetes mellitus  Diabetes mellitus type 2 with neurological manifestations ([place]) 10/04/2014   Dyspnea    Erectile dysfunction    GERD (gastroesophageal reflux disease)    Headache    Heart murmur    History of kidney stones    Hypercholesteremia    Hyperlipemia    Hypertension    Kidney stone    Low back pain    Obesity    Rotator cuff dysfunction    partial tear , left shoulder   Sleep apnea      Immunization History  Administered Date(s) Administered   PFIZER Covid-19 Tri-Sucrose Vacc  07/19/2021   PFIZER SARS-COV-2 Vacci  01/01/2020, 01/22/2020, 09/07/2020   Pneumococcal-23 05/05/2009, 03/03/2013   Td 05/05/2009   Tdap 06/21/2015   Zoster, Live 03/03/2013     Past Surgical History:  Procedure Laterality Date   COLONOSCOPY     KNEE ARTHROSCOPY     PILONIDAL CYST EXCISION     from base of spine   RT TOTAL KNEE ARTHROPLASTY;  Gaynelle Arabian, MD;  10/21/2017     FHx:    Reviewed /  unchanged  SHx:    Reviewed / unchanged   Systems Review:  Constitutional: Denies fever, chills, wt changes, headaches, insomnia, fatigue, night sweats, change in appetite. Eyes: Denies redness, blurred vision, diplopia, discharge, itchy, watery eyes.  ENT: Denies discharge, congestion, post nasal drip, epistaxis, sore throat, earache, hearing loss, dental pain, tinnitus, vertigo, sinus pain, snoring.  CV: Denies chest pain, palpitations, irregular heartbeat, syncope, dyspnea, diaphoresis, orthopnea, PND, claudication or edema. Respiratory: denies cough, dyspnea, DOE, pleurisy, hoarseness, laryngitis, wheezing.  Gastrointestinal: Denies dysphagia, odynophagia, heartburn, reflux, water brash, abdominal pain or cramps, nausea, vomiting, bloating, diarrhea, constipation, hematemesis, melena, hematochezia  or hemorrhoids. Genitourinary: Denies dysuria, frequency, urgency, nocturia, hesitancy, discharge, hematuria or flank pain. Musculoskeletal: Denies arthralgias, myalgias, stiffness, jt. swelling, pain, limping or strain/sprain.  Skin: Denies pruritus, rash, hives, warts, acne, eczema or change in skin lesion(s). Neuro: No weakness, tremor, incoordination, spasms, paresthesia or pain. Psychiatric: Denies confusion, memory loss or sensory loss. Endo: Denies change in weight, skin or hair change.  Heme/Lymph: No excessive bleeding, bruising or enlarged lymph nodes.  Physical Exam  BP 138/84   Pulse 69   Temp (!) 97.5 F (36.4 C)   Resp 16   Ht 5\' 11"  (1.803 m)   Wt 256 lb 6.4 oz (116.3 kg)   SpO2 98%   BMI 35.76 kg/m   Appears pover nourished, well groomed  and in no distress.  Eyes: PERRLA, EOMs, conjunctiva no swelling or erythema. Sinuses: No frontal/maxillary tenderness ENT/Mouth: EAC's clear, TM's nl w/o erythema, bulging. Nares clear w/o erythema, swelling, exudates. Oropharynx clear without erythema or exudates. Oral hygiene is good. Tongue normal, non obstructing. Hearing  intact.  Neck: Supple. Thyroid not palpable. Car 2+/2+ without bruits, nodes or JVD. Chest: Respirations nl with BS clear & equal w/o rales, rhonchi, wheezing or stridor.  Cor: Heart sounds normal w/ regular rate and rhythm without sig. murmurs, gallops, clicks or rubs. Peripheral pulses normal and equal  without edema.  Abdomen: Soft & bowel sounds normal. Non-tender w/o guarding, rebound, hernias, masses or organomegaly.  Lymphatics: Unremarkable.  Musculoskeletal: Full ROM all peripheral extremities, joint stability, 5/5 strength and normal gait.  Skin: Warm, dry without exposed rashes, lesions or ecchymosis apparent.  Neuro: Cranial nerves intact, reflexes equal bilaterally. Sensory-motor testing grossly intact. Tendon reflexes grossly intact.  Pysch: Alert & oriented x 3.  Insight and judgement nl & appropriate. No ideations.  Assessment and Plan:  1. Severe obesity with body mass index (BMI) of 35.0 to 39.9 with comorbidity ([place])   2. Essential hypertension  - Continue medication, monitor blood pressure at home.  - Continue DASH diet.  Reminder to go to the ER if any CP,  SOB, nausea, dizziness, severe HA, changes vision/speech.   - CBC with Differential/Platelet - COMPLETE METABOLIC PANEL WITH GFR - Magnesium - TSH  3. Hyperlipidemia associated with type 2 diabetes mellitus ([place])  - Continue diet/meds, exercise,& lifestyle modifications.  - Continue monitor periodic cholesterol/liver & renal functions    - Lipid panel - TSH  4. Type 2 diabetes mellitus with stage 2 chronic kidney  disease, without long-term current use of insulin ([facility])  - Continue diet, exercise  - Lifestyle modifications.  - Monitor appropriate labs   - COMPLETE METABOLIC PANEL WITH GFR - Hemoglobin A1c - Insulin, random  5. Vitamin D deficiency  - Continue supplementation.  - VITAMIN D 25 Hydroxy   6. Aortic atherosclerosis ([place]) by CT scan 2018  - Lipid panel  7. OSA on CPAP   8.  Medication management  - CBC with Differential/Platelet - COMPLETE METABOLIC PANEL WITH GFR - Magnesium - Lipid panel - TSH - Hemoglobin A1c - Insulin, random - VITAMIN D 25 Hydroxy (Vit-D Deficiency, Fractures)  9. Vasomotor rhinitis  - ipratropium (ATROVENT) 0.06 % nasal spray;  Use 1 to 2 sprays each nostril 2 to 3 x /day as needed for Watery Rhinorrhea   Dispense: 45 mL; Refill: 3         Discussed  regular exercise, BP monitoring, weight control to achieve/maintain BMI less than 25 and discussed med and SE's. Recommended labs to assess and monitor clinical status with further disposition pending results of labs.  I discussed the assessment and treatment plan with the patient. The patient was provided an opportunity to ask questions and all were answered. The patient agreed with the plan and demonstrated an understanding of the instructions.  I provided over 30 minutes of exam, counseling, chart review and  complex critical decision making.        The patient was advised to call back or seek an in-person evaluation if the symptoms worsen or if the condition fails to improve as anticipated.   Kirtland Bouchard, MD

## 2021-08-31 DIAGNOSIS — M47816 Spondylosis without myelopathy or radiculopathy, lumbar region: Secondary | ICD-10-CM | POA: Diagnosis not present

## 2021-08-31 LAB — CBC WITH DIFFERENTIAL/PLATELET
Absolute Monocytes: 751 cells/uL (ref 200–950)
Basophils Absolute: 76 cells/uL (ref 0–200)
Basophils Relative: 0.8 %
Eosinophils Absolute: 228 cells/uL (ref 15–500)
Eosinophils Relative: 2.4 %
HCT: 42.4 % (ref 38.5–50.0)
Hemoglobin: 14.4 g/dL (ref 13.2–17.1)
Lymphs Abs: 3477 cells/uL (ref 850–3900)
MCH: 30.8 pg (ref 27.0–33.0)
MCHC: 34 g/dL (ref 32.0–36.0)
MCV: 90.8 fL (ref 80.0–100.0)
MPV: 10.7 fL (ref 7.5–12.5)
Monocytes Relative: 7.9 %
Neutro Abs: 4969 cells/uL (ref 1500–7800)
Neutrophils Relative %: 52.3 %
Platelets: 268 10*3/uL (ref 140–400)
RBC: 4.67 10*6/uL (ref 4.20–5.80)
RDW: 12.3 % (ref 11.0–15.0)
Total Lymphocyte: 36.6 %
WBC: 9.5 10*3/uL (ref 3.8–10.8)

## 2021-08-31 LAB — COMPLETE METABOLIC PANEL WITH GFR
AG Ratio: 1.4 (calc) (ref 1.0–2.5)
ALT: 18 U/L (ref 9–46)
AST: 19 U/L (ref 10–35)
Albumin: 4.2 g/dL (ref 3.6–5.1)
Alkaline phosphatase (APISO): 37 U/L (ref 35–144)
BUN: 24 mg/dL (ref 7–25)
CO2: 23 mmol/L (ref 20–32)
Calcium: 9.3 mg/dL (ref 8.6–10.3)
Chloride: 105 mmol/L (ref 98–110)
Creat: 1.05 mg/dL (ref 0.70–1.28)
Globulin: 2.9 g/dL (calc) (ref 1.9–3.7)
Glucose, Bld: 60 mg/dL — ABNORMAL LOW (ref 65–99)
Potassium: 4.7 mmol/L (ref 3.5–5.3)
Sodium: 137 mmol/L (ref 135–146)
Total Bilirubin: 0.3 mg/dL (ref 0.2–1.2)
Total Protein: 7.1 g/dL (ref 6.1–8.1)
eGFR: 75 mL/min/{1.73_m2} (ref >=60)

## 2021-08-31 LAB — LIPID PANEL
Cholesterol: 119 mg/dL (ref <200)
HDL: 35 mg/dL — ABNORMAL LOW (ref >=40)
LDL Cholesterol (Calc): 61 mg/dL (calc)
Non-HDL Cholesterol (Calc): 84 mg/dL (calc) (ref <130)
Total CHOL/HDL Ratio: 3.4 (calc) (ref <5.0)
Triglycerides: 151 mg/dL — ABNORMAL HIGH (ref <150)

## 2021-08-31 LAB — MAGNESIUM: Magnesium: 2 mg/dL (ref 1.5–2.5)

## 2021-08-31 LAB — HEMOGLOBIN A1C
Hgb A1c MFr Bld: 6.5 % of total Hgb — ABNORMAL HIGH (ref <5.7)
Mean Plasma Glucose: 140 mg/dL
eAG (mmol/L): 7.7 mmol/L

## 2021-08-31 LAB — TSH: TSH: 3.4 mIU/L (ref 0.40–4.50)

## 2021-08-31 LAB — VITAMIN D 25 HYDROXY (VIT D DEFICIENCY, FRACTURES): Vit D, 25-Hydroxy: 70 ng/mL (ref 30–100)

## 2021-08-31 LAB — INSULIN, RANDOM: Insulin: 27.1 u[IU]/mL — ABNORMAL HIGH

## 2021-08-31 NOTE — Progress Notes (Signed)
============================================================ -   Test results slightly outside the reference range are not unusual. If there is anything important, I will review this with you,  otherwise it is considered normal test values.  If you have further questions,  please do not hesitate to contact me at the office or via My Chart.  ============================================================ ============================================================  -  Total Chol = 119    &   LDL    Chol = 61    -    Both   Excellent   - Very low risk for Heart Attack  / Stroke ============================================================ ============================================================  -  A1c = 6.5%  - Still too high & increased Risks .    -  Being diabetic has a  300% increased risk for heart attack,  stroke, cancer, and alzheimer- type vascular dementia.   -  It is very important that you work harder with diet by  avoiding all foods that are white except chicken,   fish & calliflower.  - Avoid white rice  (brown & wild rice is OK),   - Avoid white potatoes  (sweet potatoes in moderation is OK),   White bread or wheat bread or anything made out of   white flour like bagels, donuts, rolls, buns, biscuits, cakes,  - pastries, cookies, pizza crust, and pasta (made from  white flour & egg whites)   - vegetarian pasta or spinach or wheat pasta is OK.  - Multigrain breads like Arnold's, Pepperidge Farm or   multigrain sandwich thins or high fiber breads like   [place] bread or "Dave's Killer" breads that are  4 to 5 grams fiber per slice !  are best.    Diet, exercise and weight loss can reverse and cure  diabetes in the early stages.    - Diet, exercise and weight loss is very important in the   control and prevention of complications of diabetes which  affects every system in your body, ie.   -Brain - dementia/stroke,  - eyes - glaucoma/blindness,  - heart -  heart attack/heart failure,  - kidneys - dialysis,  - stomach - gastric paralysis,  - intestines - malabsorption,  - nerves - severe painful neuritis,  - circulation - gangrene & loss of a leg(s)  - and finally  . . . . . . . . . . . . . . . . . .    - cancer and Alzheimers. ============================================================ ============================================================  -  Vitamin D = 70 - Excellent - Please keep dose same  ============================================================ ============================================================  -  All Else - CBC - Kidneys - Electrolytes - Liver - Magnesium & Thyroid    - all  Normal / OK ===========================================================  ===========================================================

## 2021-09-11 ENCOUNTER — Other Ambulatory Visit: Payer: Self-pay | Admitting: Nurse Practitioner

## 2021-09-11 DIAGNOSIS — E1122 Type 2 diabetes mellitus with diabetic chronic kidney disease: Secondary | ICD-10-CM

## 2021-09-11 DIAGNOSIS — E1169 Type 2 diabetes mellitus with other specified complication: Secondary | ICD-10-CM

## 2021-09-11 DIAGNOSIS — I10 Essential (primary) hypertension: Secondary | ICD-10-CM

## 2021-09-11 DIAGNOSIS — E785 Hyperlipidemia, unspecified: Secondary | ICD-10-CM

## 2021-09-11 DIAGNOSIS — N182 Chronic kidney disease, stage 2 (mild): Secondary | ICD-10-CM

## 2021-09-11 MED ORDER — OLMESARTAN MEDOXOMIL 40 MG PO TABS: 40.0000 mg | ORAL_TABLET | Freq: Every day | ORAL | 3 refills | Status: DC

## 2021-09-11 MED ORDER — SIMVASTATIN 40 MG PO TABS: ORAL_TABLET | ORAL | 3 refills | Status: DC

## 2021-09-11 MED ORDER — GLIMEPIRIDE 1 MG PO TABS: ORAL_TABLET | ORAL | 3 refills | Status: DC

## 2021-09-13 DIAGNOSIS — K5900 Constipation, unspecified: Secondary | ICD-10-CM | POA: Diagnosis not present

## 2021-09-14 DIAGNOSIS — M47816 Spondylosis without myelopathy or radiculopathy, lumbar region: Secondary | ICD-10-CM | POA: Diagnosis not present

## 2021-09-25 DIAGNOSIS — M47816 Spondylosis without myelopathy or radiculopathy, lumbar region: Secondary | ICD-10-CM | POA: Diagnosis not present

## 2021-09-26 ENCOUNTER — Other Ambulatory Visit (HOSPITAL_BASED_OUTPATIENT_CLINIC_OR_DEPARTMENT_OTHER): Payer: Self-pay

## 2021-09-26 ENCOUNTER — Ambulatory Visit: Payer: Medicare Other | Attending: Internal Medicine

## 2021-09-26 DIAGNOSIS — Z23 Encounter for immunization: Secondary | ICD-10-CM

## 2021-09-26 MED ORDER — PFIZER COVID-19 VAC BIVALENT 30 MCG/0.3ML IM SUSP
INTRAMUSCULAR | 0 refills | Status: DC
  Filled 2021-09-26: qty 0.3, 1d supply, fill #0

## 2021-09-26 NOTE — Progress Notes (Signed)
   Covid-19 Vaccination Clinic  Name:  AVONTAE BURKHEAD    MRN: [id number] DOB: 10-22-1946  09/26/2021  Mr. Whitworth was observed post Covid-19 immunization for 15 minutes without incident. He was provided with Vaccine Information Sheet and instruction to access the V-Safe system.   Mr. Bouillon was instructed to call 911 with any severe reactions post vaccine: Difficulty breathing  Swelling of face and throat  A fast heartbeat  A bad rash all over body  Dizziness and weakness

## 2021-10-10 ENCOUNTER — Other Ambulatory Visit: Payer: Self-pay

## 2021-10-10 ENCOUNTER — Encounter: Payer: Self-pay | Admitting: Adult Health

## 2021-10-10 ENCOUNTER — Other Ambulatory Visit: Payer: Medicare Other | Admitting: Internal Medicine

## 2021-10-10 ENCOUNTER — Encounter: Payer: Self-pay | Admitting: Internal Medicine

## 2021-10-10 ENCOUNTER — Ambulatory Visit: Payer: Medicare Other | Admitting: Adult Health

## 2021-10-10 VITALS — BP 123/54 | HR 83

## 2021-10-10 DIAGNOSIS — U071 COVID-19: Secondary | ICD-10-CM | POA: Insufficient documentation

## 2021-10-10 DIAGNOSIS — J300 Vasomotor rhinitis: Secondary | ICD-10-CM | POA: Insufficient documentation

## 2021-10-10 MED ORDER — DEXAMETHASONE 1 MG PO TABS: ORAL_TABLET | ORAL | 0 refills | Status: DC

## 2021-10-10 MED ORDER — PROMETHAZINE-DM 6.25-15 MG/5ML PO SYRP: 5.0000 mL | ORAL_SOLUTION | Freq: Four times a day (QID) | ORAL | 1 refills | Status: DC | PRN

## 2021-10-10 NOTE — Progress Notes (Signed)
THIS ENCOUNTER IS A VIRTUAL VISIT DUE TO COVID-19 - PATIENT WAS NOT SEEN IN THE OFFICE.  PATIENT HAS CONSENTED TO VIRTUAL VISIT / TELEMEDICINE VISIT   Virtual Visit via telephone Note  I connected with  Joshua Koch on 10/10/2021 by telephone.  I verified that I am speaking with the correct person using two identifiers.    I discussed the limitations of evaluation and management by telemedicine and the availability of in person appointments. The patient expressed understanding and agreed to proceed.  History of Present Illness:  BP (!) 123/54   Pulse 83  75 y.o. patient with T2DM, morbid obesity, OSA contacted office reporting URI sx. he tested positive by rapid screen at home this morning. OV was conducted by telephone to minimize exposure. This patient was vaccinated for covid 19, last boosted 09/26/2021.   Sx began 3 days ago with scratchy throat (resolved), nasal congestion, aching in chest with cough (denies CP at rest or exertion, dyspnea, wheezing), generalized back ach/leg ache, dry cough/non-productive. Denies fever/chills, fatigue, dyspnea. Denies GI sx.   Treatments tried so far: nasal sprays, taking tylenol for aching.   Exposures: just back from [employer] [place] last week   He reports diabetes is well controlled;  Lab Results  Component Value Date   HGBA1C 6.5 (H) 08/30/2021     Medications  Current Outpatient Medications (Endocrine & Metabolic):    glimepiride (AMARYL) 1 MG tablet, Take 2 tablet 2 x /day with Meals for Diabetes   metFORMIN (GLUCOPHAGE-XR) 500 MG 24 hr tablet, Take  2 tablets  2 x /day  with Meals  for Diabetes  Current Outpatient Medications (Cardiovascular):    olmesartan (BENICAR) 40 MG tablet, Take 1 tablet (40 mg total) by mouth daily.   simvastatin (ZOCOR) 40 MG tablet, TAKE 1 TABLET BY MOUTH AT  BEDTIME FOR CHOLESTEROL  Current Outpatient Medications (Respiratory):    cetirizine (ZYRTEC) 10 MG tablet, Take 10 mg by mouth daily.   ipratropium  (ATROVENT) 0.06 % nasal spray, Use 1 to 2 sprays each nostril 2 to 3 x /day as needed for Watery Rhinorrhea  Current Outpatient Medications (Analgesics):    aspirin (ECOTRIN LOW STRENGTH) 81 MG EC tablet, Take 1 tablet daily   Celecoxib (CELEBREX PO), Take 200 mg by mouth daily. Daily PRN back pain   Current Outpatient Medications (Other):    APPLE CIDER VINEGAR PO, Take by mouth daily.   Ascorbic Acid (VITAMIN C PO), Take by mouth. 1 tablet daily   CHOLECALCIFEROL PO, Take 10,000 Units by mouth daily.   Magnesium 400 MG CAPS, Take 1 capsule by mouth 2 (two) times daily.   Omega-3 Fatty Acids (FISH OIL PO), Take by mouth 2 (two) times daily.   Zinc 50 MG TABS, Take by mouth daily.  Allergies:  Allergies  Allergen Reactions   No Known Allergies     Problem list He has Type 2 diabetes mellitus with stage 2 chronic kidney disease, without long-term current use of insulin ([place]); Essential hypertension; Hyperlipidemia associated with type 2 diabetes mellitus ([place]); OA (osteoarthritis) of knee; Aortic atherosclerosis ([place]) by CT scan 2018; Vitamin D deficiency; Severe obesity with body mass index (BMI) of 35.0 to 39.9 with comorbidity ([place]); CKD stage 2 due to type 2 diabetes mellitus ([place]); Inappropriate diet or eating habits; Diabetic mononeuropathy associated with type 2 diabetes mellitus ([place]); Lumbar spondylosis; 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   Social History:   reports that he has never smoked. He has never used smokeless tobacco. He reports current alcohol use of about 1.0 standard drink per week. He reports that he does not use drugs.  Observations/Objective:  General : Well sounding patient in no apparent distress HEENT: no hoarseness, no cough for duration of visit Lungs:  speaks in complete sentences, no audible wheezing, no apparent distress Neurological: alert, oriented x 3 Psychiatric: pleasant, judgement appropriate   Assessment and Plan:  Covid 19 Covid 19 positive per rapid screening test today, day 3 of sx Risk factors include: morbid obesity, T2DM, age Symptoms are: mild, "like a cold/flu" Due to co morbid conditions and risk factors, discussed antiviral, patient declined (can call back and start up to day 5 from onset if any worsening) Immue support  Steroid taper offered Reviewed doing regular breathing exercises,  Take tylenol PRN temp 101+ Push hydration Regular ambulation or calf exercises exercises for clot prevention and 81 mg ASA unless contraindicated Sx supportive therapy suggested and reviewed Follow up via mychart or telephone if needed Advised patient obtain O2 monitor; present to ED if persistently <88% or with severe dyspnea, CP, fever uncontrolled by tylenol, confusion, sudden decline Should remain in isolation until at least 5 days from onset of sx, 24-48 hours fever free without tylenol, sx such as cough are improved.  -     dexamethasone (DECADRON) 1 MG tablet; Take 3 tabs for 3 days, 2 tabs for 3 days 1 tab for 5 days. Take with food. -     promethazine-dextromethorphan (PROMETHAZINE-DM) 6.25-15 MG/5ML syrup; Take 5 mLs by mouth 4 (four) times daily as needed for cough.   Follow Up Instructions:  I discussed the assessment and treatment plan with the patient. The patient was provided an opportunity to ask questions and all were answered. The patient agreed with the plan and demonstrated an understanding of the instructions.   The patient was advised to call back or seek an in-person evaluation if the symptoms worsen or if the condition fails to improve as anticipated.  I provided 20 minutes of non-face-to-face time during this encounter.   Izora Ribas, NP

## 2021-10-18 ENCOUNTER — Telehealth: Payer: Self-pay

## 2021-10-18 NOTE — Telephone Encounter (Signed)
Still congested and lots of sinus pressure. Has a cough and tightness in his chest. Still testing positive for Covid.

## 2021-10-19 ENCOUNTER — Encounter (HOSPITAL_BASED_OUTPATIENT_CLINIC_OR_DEPARTMENT_OTHER): Payer: Self-pay | Admitting: Emergency Medicine

## 2021-10-19 ENCOUNTER — Other Ambulatory Visit: Payer: Self-pay

## 2021-10-19 ENCOUNTER — Emergency Department (HOSPITAL_BASED_OUTPATIENT_CLINIC_OR_DEPARTMENT_OTHER)
Admission: EM | Admit: 2021-10-19 | Discharge: 2021-10-19 | Disposition: A | Discharge status: Home / Self Care | Payer: Medicare Other | Attending: Emergency Medicine | Admitting: Emergency Medicine

## 2021-10-19 ENCOUNTER — Ambulatory Visit: Payer: Medicare Other | Admitting: Adult Health

## 2021-10-19 ENCOUNTER — Emergency Department (HOSPITAL_BASED_OUTPATIENT_CLINIC_OR_DEPARTMENT_OTHER): Payer: Medicare Other

## 2021-10-19 DIAGNOSIS — E119 Type 2 diabetes mellitus without complications: Secondary | ICD-10-CM | POA: Diagnosis not present

## 2021-10-19 DIAGNOSIS — Z7984 Long term (current) use of oral hypoglycemic drugs: Secondary | ICD-10-CM | POA: Insufficient documentation

## 2021-10-19 DIAGNOSIS — Z96651 Presence of right artificial knee joint: Secondary | ICD-10-CM | POA: Diagnosis not present

## 2021-10-19 DIAGNOSIS — K529 Noninfective gastroenteritis and colitis, unspecified: Secondary | ICD-10-CM

## 2021-10-19 DIAGNOSIS — Z85828 Personal history of other malignant neoplasm of skin: Secondary | ICD-10-CM | POA: Diagnosis not present

## 2021-10-19 DIAGNOSIS — R109 Unspecified abdominal pain: Secondary | ICD-10-CM | POA: Diagnosis not present

## 2021-10-19 DIAGNOSIS — J45909 Unspecified asthma, uncomplicated: Secondary | ICD-10-CM | POA: Insufficient documentation

## 2021-10-19 DIAGNOSIS — I10 Essential (primary) hypertension: Secondary | ICD-10-CM | POA: Insufficient documentation

## 2021-10-19 DIAGNOSIS — Z79899 Other long term (current) drug therapy: Secondary | ICD-10-CM | POA: Diagnosis not present

## 2021-10-19 DIAGNOSIS — Z7982 Long term (current) use of aspirin: Secondary | ICD-10-CM | POA: Insufficient documentation

## 2021-10-19 DIAGNOSIS — R1110 Vomiting, unspecified: Secondary | ICD-10-CM | POA: Diagnosis not present

## 2021-10-19 LAB — COMPREHENSIVE METABOLIC PANEL
ALT: 31 U/L (ref 0–44)
AST: 24 U/L (ref 15–41)
Albumin: 4.1 g/dL (ref 3.5–5.0)
Alkaline Phosphatase: 39 U/L (ref 38–126)
Anion gap: 13 (ref 5–15)
BUN: 38 mg/dL — ABNORMAL HIGH (ref 8–23)
CO2: 22 mmol/L (ref 22–32)
Calcium: 9.6 mg/dL (ref 8.9–10.3)
Chloride: 99 mmol/L (ref 98–111)
Creatinine, Ser: 1.56 mg/dL — ABNORMAL HIGH (ref 0.61–1.24)
GFR, Estimated: 46 mL/min — ABNORMAL LOW (ref >60)
Glucose, Bld: 274 mg/dL — ABNORMAL HIGH (ref 70–99)
Potassium: 4.7 mmol/L (ref 3.5–5.1)
Sodium: 134 mmol/L — ABNORMAL LOW (ref 135–145)
Total Bilirubin: 0.6 mg/dL (ref 0.3–1.2)
Total Protein: 7.7 g/dL (ref 6.5–8.1)

## 2021-10-19 LAB — CBC WITH DIFFERENTIAL/PLATELET
Abs Immature Granulocytes: 0.12 10*3/uL — ABNORMAL HIGH (ref 0.00–0.07)
Basophils Absolute: 0.1 10*3/uL (ref 0.0–0.1)
Basophils Relative: 0 %
Eosinophils Absolute: 0.1 10*3/uL (ref 0.0–0.5)
Eosinophils Relative: 0 %
HCT: 45.5 % (ref 39.0–52.0)
Hemoglobin: 15.5 g/dL (ref 13.0–17.0)
Immature Granulocytes: 1 %
Lymphocytes Relative: 12 %
Lymphs Abs: 2.5 10*3/uL (ref 0.7–4.0)
MCH: 30.5 pg (ref 26.0–34.0)
MCHC: 34.1 g/dL (ref 30.0–36.0)
MCV: 89.6 fL (ref 80.0–100.0)
Monocytes Absolute: 1 10*3/uL (ref 0.1–1.0)
Monocytes Relative: 5 %
Neutro Abs: 17.5 10*3/uL — ABNORMAL HIGH (ref 1.7–7.7)
Neutrophils Relative %: 82 %
Platelets: 413 10*3/uL — ABNORMAL HIGH (ref 150–400)
RBC: 5.08 MIL/uL (ref 4.22–5.81)
RDW: 12.5 % (ref 11.5–15.5)
WBC: 21.3 10*3/uL — ABNORMAL HIGH (ref 4.0–10.5)
nRBC: 0 % (ref 0.0–0.2)

## 2021-10-19 LAB — LIPASE, BLOOD: Lipase: 27 U/L (ref 11–51)

## 2021-10-19 MED ORDER — ONDANSETRON 8 MG PO TBDP: 8.0000 mg | ORAL_TABLET | Freq: Three times a day (TID) | ORAL | 0 refills | Status: DC | PRN

## 2021-10-19 MED ORDER — DICYCLOMINE HCL 20 MG PO TABS: 10.0000 mg | ORAL_TABLET | Freq: Four times a day (QID) | ORAL | 0 refills | Status: DC | PRN

## 2021-10-19 MED ORDER — IOHEXOL 300 MG/ML  SOLN
100.0000 mL | Freq: Once | INTRAMUSCULAR | Status: AC | PRN
  Administered 2021-10-19: 100 mL via INTRAVENOUS

## 2021-10-19 MED ORDER — FENTANYL CITRATE PF 50 MCG/ML IJ SOSY
100.0000 ug | PREFILLED_SYRINGE | Freq: Once | INTRAMUSCULAR | Status: AC
  Administered 2021-10-19: 100 ug via INTRAVENOUS
  Filled 2021-10-19: qty 2

## 2021-10-19 MED ORDER — METOCLOPRAMIDE HCL 5 MG/ML IJ SOLN
10.0000 mg | Freq: Once | INTRAMUSCULAR | Status: AC
  Administered 2021-10-19: 10 mg via INTRAVENOUS
  Filled 2021-10-19: qty 2

## 2021-10-19 MED ORDER — SODIUM CHLORIDE 0.9 % IV BOLUS
1000.0000 mL | Freq: Once | INTRAVENOUS | Status: AC
  Administered 2021-10-19: 1000 mL via INTRAVENOUS

## 2021-10-19 NOTE — ED Provider Notes (Signed)
[facility] Provider Note: Georgena Spurling, MD, FACEP  CSN: [id number] MRN: [id number] ARRIVAL: 10/19/21 at [facility]: [place]  Abdominal Pain   HISTORY OF PRESENT ILLNESS  10/19/21 2:40 AM Joshua Koch is a 75 y.o. male positive for COVID on October 31 and tested again positive yesterday.  He is here with severe abdominal pain that began yesterday evening after eating dinner.  The pain is located periumbilically with radiation up into the epigastrium and into the left lower quadrant.  It is aching and cramping and not constant.  He rates it as a 6 out of 10.  It is somewhat worse with palpation.  He has had associated nausea and vomiting and estimates he has vomited 7 times.  He has had 1 bowel movement but it was normal.  He has not had a fever.  He tried taking a hydrocodone tablet but vomited this up.  He has had associated hiccuping and belching as well.   Past Medical History:  Diagnosis Date   Arthritis    Asthma    childhood   Cancer ([place])    basal cell face and back   Diabetes mellitus    Diabetes mellitus type 2 with neurological manifestations ([place]) 10/04/2014   Dyspnea    Erectile dysfunction    GERD (gastroesophageal reflux disease)    Headache    Heart murmur    History of kidney stones    Hypercholesteremia    Hyperlipemia    Hypertension    Kidney stone    Low back pain    Obesity    Rotator cuff dysfunction    partial tear , left shoulder   Sleep apnea     Past Surgical History:  Procedure Laterality Date   COLONOSCOPY     KNEE ARTHROSCOPY     PILONIDAL CYST EXCISION     from base of spine   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   Procedure: RIGHT TOTAL KNEE ARTHROPLASTY;  Surgeon: Gaynelle Arabian, MD;  Location: [facility];  Service: Orthopedics;  Laterality: Right;    Family History  Problem Relation Age of Onset   Heart attack Father 59       MI   Lung cancer Sister        smoker   COPD Brother    CAD  Brother 90   Stroke Brother     Social History   Tobacco Use   Smoking status: Never   Smokeless tobacco: Never  Vaping Use   Vaping Use: Never used  Substance Use Topics   Alcohol use: Yes    Alcohol/week: 1.0 standard drink    Types: 1 Cans of beer per week    Comment: occasional   Drug use: No    Prior to Admission medications   Medication Sig Start Date End Date Taking? Authorizing Provider  dicyclomine (BENTYL) 20 MG tablet Take 0.5-1 tablets (10-20 mg total) by mouth 4 (four) times daily as needed (Abdominal cramping). 10/19/21  Yes Karnisha Lefebre, MD  ondansetron (ZOFRAN ODT) 8 MG disintegrating tablet Take 1 tablet (8 mg total) by mouth every 8 (eight) hours as needed for vomiting or nausea. 10/19/21  Yes Stephie Xu, MD  APPLE CIDER VINEGAR PO Take by mouth daily.    [provider]  Ascorbic Acid (VITAMIN C PO) Take by mouth. 1 tablet daily    [provider]  aspirin (ECOTRIN LOW STRENGTH) 81 MG EC tablet Take 1 tablet daily 09/06/18  Unk Pinto, MD  cetirizine (ZYRTEC) 10 MG tablet Take 10 mg by mouth daily.    [provider]  CHOLECALCIFEROL PO Take 10,000 Units by mouth daily.    [provider]  glimepiride (AMARYL) 1 MG tablet Take 2 tablet 2 x /day with Meals for Diabetes 09/11/21   Magda Bernheim, NP  ipratropium (ATROVENT) 0.06 % nasal spray USE 1 TO 2 SPRAYS EACH NOSTRIL 2 TO 3 X /DAY AS NEEDED FOR WATERY RHINORRHEA 10/10/21   Unk Pinto, MD  Magnesium 400 MG CAPS Take 1 capsule by mouth 2 (two) times daily.    [provider]  metFORMIN (GLUCOPHAGE-XR) 500 MG 24 hr tablet Take  2 tablets  2 x /day  with Meals  for Diabetes 05/10/21   Unk Pinto, MD  olmesartan (BENICAR) 40 MG tablet Take 1 tablet (40 mg total) by mouth daily. 09/11/21 09/11/22  Magda Bernheim, NP  Omega-3 Fatty Acids (FISH OIL PO) Take by mouth 2 (two) times daily.    [provider]  promethazine-dextromethorphan (PROMETHAZINE-DM)  6.25-15 MG/5ML syrup Take 5 mLs by mouth 4 (four) times daily as needed for cough. 10/10/21   Liane Comber, NP  simvastatin (ZOCOR) 40 MG tablet TAKE 1 TABLET BY MOUTH AT  BEDTIME FOR CHOLESTEROL 09/11/21   Magda Bernheim, NP  Zinc 50 MG TABS Take by mouth daily.    [provider]    Allergies No known allergies   REVIEW OF SYSTEMS  Negative except as noted here or in the History of Present Illness.   PHYSICAL EXAMINATION  Initial Vital Signs Blood pressure (!) 174/55, pulse 61, temperature 97.8 F (36.6 C), temperature source Oral, resp. rate 18, height 5\' 11"  (1.803 m), weight 113.4 kg, SpO2 100 %.  Examination General: Well-developed, well-nourished male in no acute distress; appearance consistent with age of record HENT: normocephalic; atraumatic Eyes: pupils equal, round and reactive to light; extraocular muscles intact Neck: supple Heart: regular rate and rhythm Lungs: clear to auscultation bilaterally; hiccups Abdomen: soft; nondistended; periumbilical, epigastric and left lower quadrant tenderness; bowel sounds present Extremities: No deformity; full range of motion; pulses normal Neurologic: Awake, alert and oriented; motor function intact in all extremities and symmetric; no facial droop Skin: Warm and dry; chronic appearing hyperpigmentation of lower legs Psychiatric: Normal mood and affect   RESULTS  Summary of this visit's results, reviewed and interpreted by myself:   EKG Interpretation  Date/Time:    Ventricular Rate:    PR Interval:    QRS Duration:   QT Interval:    QTC Calculation:   R Axis:     Text Interpretation:         Laboratory Studies: Results for orders placed or performed during the hospital encounter of 10/19/21 (from the past 24 hour(s))  CBC with Differential/Platelet     Status: Abnormal   Collection Time: 10/19/21  2:46 AM  Result Value Ref Range   WBC 21.3 (H) 4.0 - 10.5 K/uL   RBC 5.08 4.22 - 5.81 MIL/uL   Hemoglobin  15.5 13.0 - 17.0 g/dL   HCT 45.5 39.0 - 52.0 %   MCV 89.6 80.0 - 100.0 fL   MCH 30.5 26.0 - 34.0 pg   MCHC 34.1 30.0 - 36.0 g/dL   RDW 12.5 11.5 - 15.5 %   Platelets 413 (H) 150 - 400 K/uL   nRBC 0.0 0.0 - 0.2 %   Neutrophils Relative % 82 %   Neutro Abs 17.5 (H)  1.7 - 7.7 K/uL   Lymphocytes Relative 12 %   Lymphs Abs 2.5 0.7 - 4.0 K/uL   Monocytes Relative 5 %   Monocytes Absolute 1.0 0.1 - 1.0 K/uL   Eosinophils Relative 0 %   Eosinophils Absolute 0.1 0.0 - 0.5 K/uL   Basophils Relative 0 %   Basophils Absolute 0.1 0.0 - 0.1 K/uL   Immature Granulocytes 1 %   Abs Immature Granulocytes 0.12 (H) 0.00 - 0.07 K/uL  Comprehensive metabolic panel     Status: Abnormal   Collection Time: 10/19/21  2:46 AM  Result Value Ref Range   Sodium 134 (L) 135 - 145 mmol/L   Potassium 4.7 3.5 - 5.1 mmol/L   Chloride 99 98 - 111 mmol/L   CO2 22 22 - 32 mmol/L   Glucose, Bld 274 (H) 70 - 99 mg/dL   BUN 38 (H) 8 - 23 mg/dL   Creatinine, Ser 1.56 (H) 0.61 - 1.24 mg/dL   Calcium 9.6 8.9 - 10.3 mg/dL   Total Protein 7.7 6.5 - 8.1 g/dL   Albumin 4.1 3.5 - 5.0 g/dL   AST 24 15 - 41 U/L   ALT 31 0 - 44 U/L   Alkaline Phosphatase 39 38 - 126 U/L   Total Bilirubin 0.6 0.3 - 1.2 mg/dL   GFR, Estimated 46 (L) >60 mL/min   Anion gap 13 5 - 15  Lipase, blood     Status: None   Collection Time: 10/19/21  2:46 AM  Result Value Ref Range   Lipase 27 11 - 51 U/L   Imaging Studies: CT ABDOMEN PELVIS W CONTRAST  Result Date: 10/19/2021 CLINICAL DATA:  Abdominal pain with vomiting EXAM: CT ABDOMEN AND PELVIS WITH CONTRAST TECHNIQUE: Multidetector CT imaging of the abdomen and pelvis was performed using the standard protocol following bolus administration of intravenous contrast. CONTRAST:  141mL OMNIPAQUE IOHEXOL 300 MG/ML  SOLN COMPARISON:  02/22/2012 FINDINGS: Lower chest:  No contributory findings. Hepatobiliary: No focal liver abnormality.No evidence of biliary obstruction or stone. Pancreas:  Unremarkable. Spleen: Unremarkable. Adrenals/Urinary Tract: Negative adrenals. No hydronephrosis or stone. Simple left renal cyst unremarkable bladder. Stomach/Bowel: Intermittent fluid levels in the colon and small bowel. No transition point. No bowel wall thickening or appendicitis. Vascular/Lymphatic: Scattered atheromatous calcification of the aorta and iliacs. No mass or adenopathy. Reproductive:No pathologic findings. Other: No ascites or pneumoperitoneum. Musculoskeletal: No acute abnormalities. Generalized lumbar spine degeneration with mild scoliosis. IMPRESSION: Small bowel and colonic fluid levels suggesting enteritis/diarrheal illness. Electronically Signed   By: Jorje Guild M.D.   On: 10/19/2021 04:10    ED COURSE and MDM  Nursing notes, initial and subsequent vitals signs, including pulse oximetry, reviewed and interpreted by myself.  Vitals:   10/19/21 0237 10/19/21 0300 10/19/21 0330 10/19/21 0400  BP: (!) 174/55 (!) 138/54 (!) 148/54 (!) 165/54  Pulse: 61 79 88 90  Resp: 18 18 18 18   Temp: 97.8 F (36.6 C)     TempSrc: Oral     SpO2: 100% 100% 97% 99%  Weight:      Height:       Medications  sodium chloride 0.9 % bolus 1,000 mL (1,000 mLs Intravenous New Bag/Given 10/19/21 0304)  metoCLOPramide (REGLAN) injection 10 mg (10 mg Intravenous Given 10/19/21 0302)  fentaNYL (SUBLIMAZE) injection 100 mcg (100 mcg Intravenous Given 10/19/21 0303)  iohexol (OMNIPAQUE) 300 MG/ML solution 100 mL (100 mLs Intravenous Contrast Given 10/19/21 0339)   Presentation consistent with gastroenteritis.  Although the patient  has not had to had diarrhea his CT findings suggest diarrhea is soon to occur.  We will treat with antinausea and antispasmodic medication.  He was advised he may take over-the-counter Imodium for diarrhea.   PROCEDURES  Procedures   ED DIAGNOSES     ICD-10-CM   1. Gastroenteritis  K52.9          Dakari Stabler, MD 10/19/21 0425

## 2021-10-19 NOTE — ED Triage Notes (Addendum)
Pt is c/o severe abd pain that started last night after he ate supper  Pt states he had green beans and chicken and dumplings  Pt states he has had vomiting  States he had a normal bowel movement  Pt states the pain is from his mid abdomen upward  Pt states he tested Covid positive on October 31st and again yesterday

## 2021-11-13 DIAGNOSIS — M791 Myalgia, unspecified site: Secondary | ICD-10-CM | POA: Diagnosis not present

## 2021-11-13 DIAGNOSIS — M47816 Spondylosis without myelopathy or radiculopathy, lumbar region: Secondary | ICD-10-CM | POA: Diagnosis not present

## 2022-01-22 DIAGNOSIS — H25013 Cortical age-related cataract, bilateral: Secondary | ICD-10-CM | POA: Diagnosis not present

## 2022-01-22 DIAGNOSIS — H2513 Age-related nuclear cataract, bilateral: Secondary | ICD-10-CM | POA: Diagnosis not present

## 2022-01-22 DIAGNOSIS — H524 Presbyopia: Secondary | ICD-10-CM | POA: Diagnosis not present

## 2022-01-22 DIAGNOSIS — E113293 Type 2 diabetes mellitus with mild nonproliferative diabetic retinopathy without macular edema, bilateral: Secondary | ICD-10-CM | POA: Diagnosis not present

## 2022-01-22 LAB — HM DIABETES EYE EXAM

## 2022-01-24 ENCOUNTER — Encounter: Payer: Self-pay | Admitting: Internal Medicine

## 2022-01-31 DIAGNOSIS — I8312 Varicose veins of left lower extremity with inflammation: Secondary | ICD-10-CM | POA: Diagnosis not present

## 2022-01-31 DIAGNOSIS — Z85828 Personal history of other malignant neoplasm of skin: Secondary | ICD-10-CM | POA: Diagnosis not present

## 2022-01-31 DIAGNOSIS — D225 Melanocytic nevi of trunk: Secondary | ICD-10-CM | POA: Diagnosis not present

## 2022-01-31 DIAGNOSIS — I872 Venous insufficiency (chronic) (peripheral): Secondary | ICD-10-CM | POA: Diagnosis not present

## 2022-01-31 DIAGNOSIS — D2261 Melanocytic nevi of right upper limb, including shoulder: Secondary | ICD-10-CM | POA: Diagnosis not present

## 2022-01-31 DIAGNOSIS — I8311 Varicose veins of right lower extremity with inflammation: Secondary | ICD-10-CM | POA: Diagnosis not present

## 2022-01-31 DIAGNOSIS — D485 Neoplasm of uncertain behavior of skin: Secondary | ICD-10-CM | POA: Diagnosis not present

## 2022-01-31 DIAGNOSIS — D2239 Melanocytic nevi of other parts of face: Secondary | ICD-10-CM | POA: Diagnosis not present

## 2022-01-31 DIAGNOSIS — D0461 Carcinoma in situ of skin of right upper limb, including shoulder: Secondary | ICD-10-CM | POA: Diagnosis not present

## 2022-01-31 DIAGNOSIS — L821 Other seborrheic keratosis: Secondary | ICD-10-CM | POA: Diagnosis not present

## 2022-01-31 DIAGNOSIS — L570 Actinic keratosis: Secondary | ICD-10-CM | POA: Diagnosis not present

## 2022-01-31 DIAGNOSIS — D2262 Melanocytic nevi of left upper limb, including shoulder: Secondary | ICD-10-CM | POA: Diagnosis not present

## 2022-02-06 ENCOUNTER — Encounter: Payer: Medicare Other | Admitting: Adult Health

## 2022-02-06 IMAGING — MR MR LUMBAR SPINE W/O CM
4 of 5 series · 19 of 48 positions shown · non-contrast
Comparison: Lumbar spine MRI 03/17/2018 from [REDACTED]

CLINICAL DATA: Chronic low back and bilateral leg pain.

EXAM:
MRI LUMBAR SPINE WITHOUT CONTRAST
TECHNIQUE: Multiplanar, multisequence MR imaging of the lumbar spine was
performed. No intravenous contrast was administered.

[Series 5: T2 · sagittal · 4.0mm · 0.73mm/px · 6 of 17 slices shown (1 of 2)]
[im 1/17]
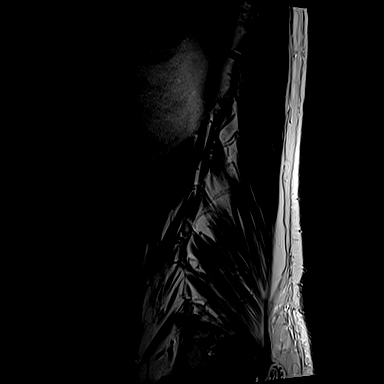
[im 4/17]
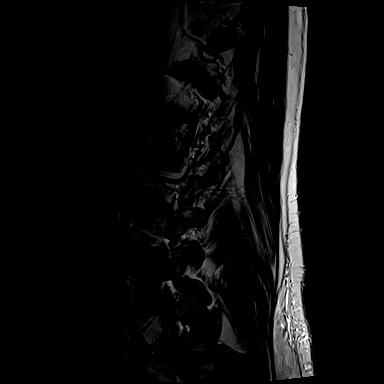
[im 7/17]
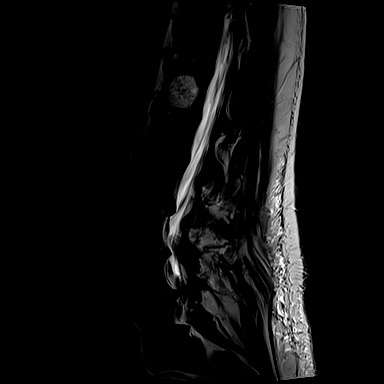
[im 10/17]
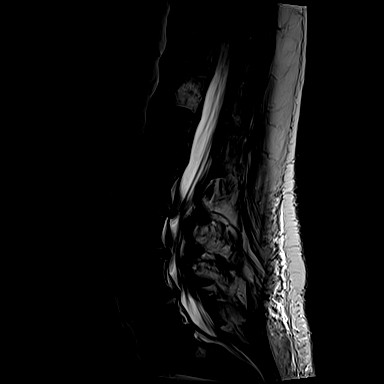
[im 13/17]
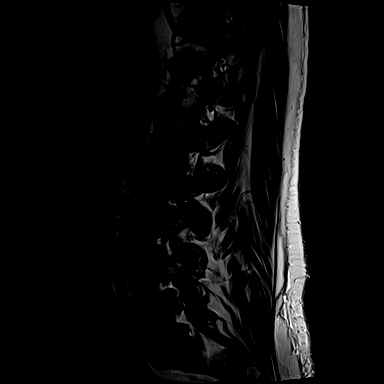
[im 17/17]
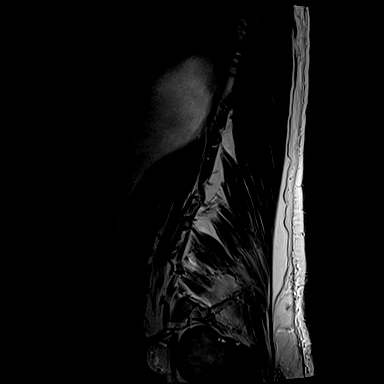

[Series 6: T1 · sagittal · 4.0mm · 1.09mm/px · 3 of 17 slices shown (1 of 2)]
[im 4/17]
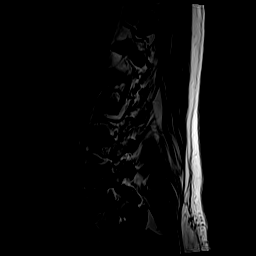
[im 10/17]
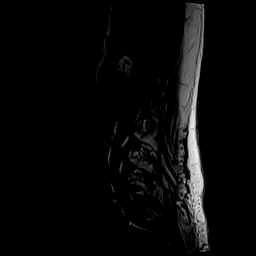
[im 17/17]
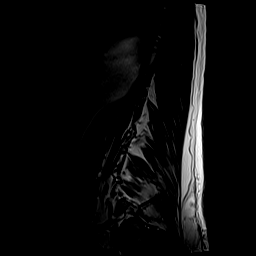

[Series 10: T1 · axial · 4.0mm · 0.28mm/px · z∈[-101,+81]mm · 3 of 46 slices shown (2 of 2)]
[im 7/46]
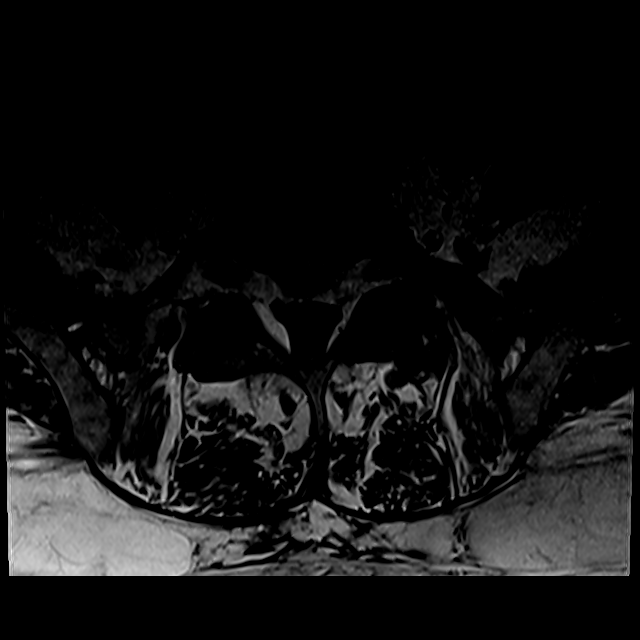
[im 23/46]
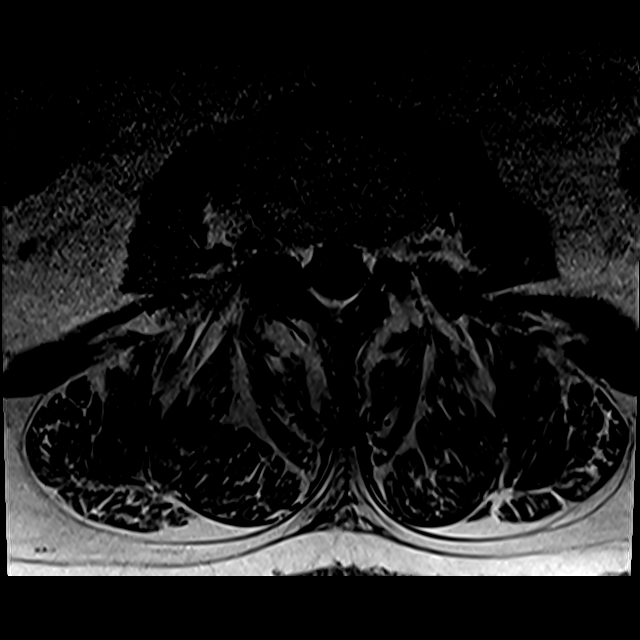
[im 39/46]
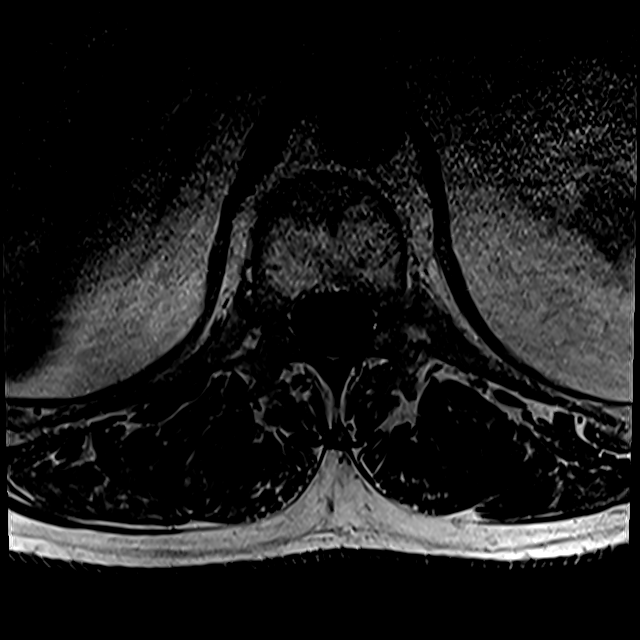

[Series 13: T2 · axial · 4.0mm · 0.28mm/px · z∈[-131,+81]mm · 7 of 46 slices shown (2 of 2)]
[im 1/46]
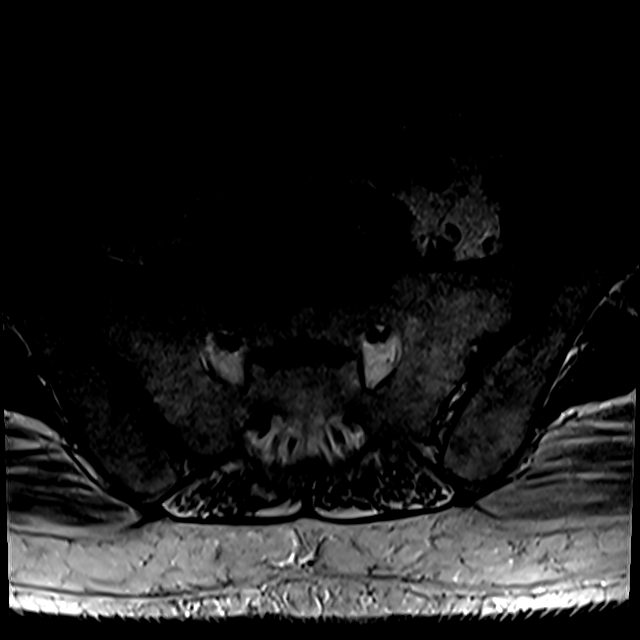
[im 7/46]
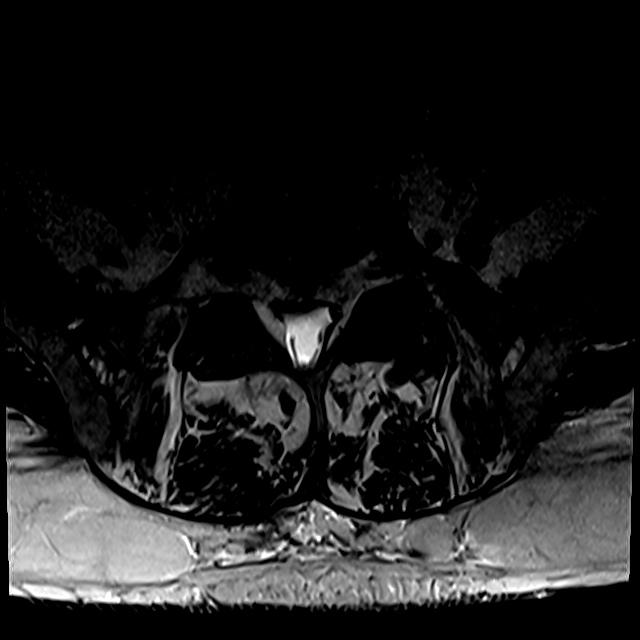
[im 13/46]
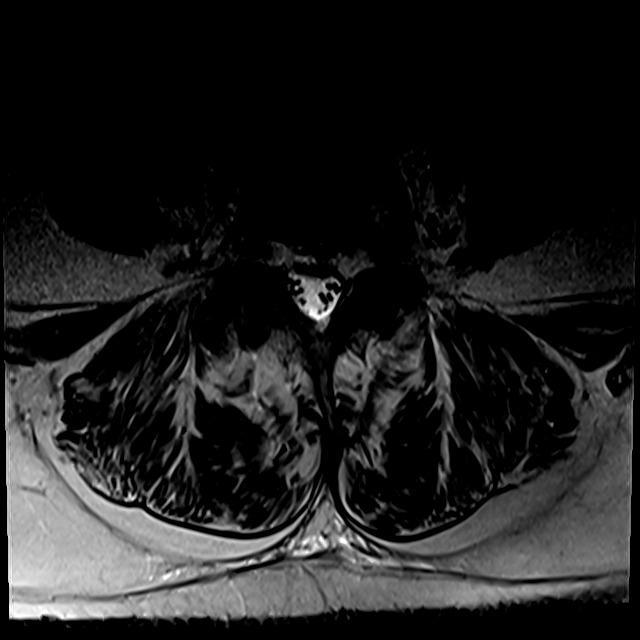
[im 20/46]
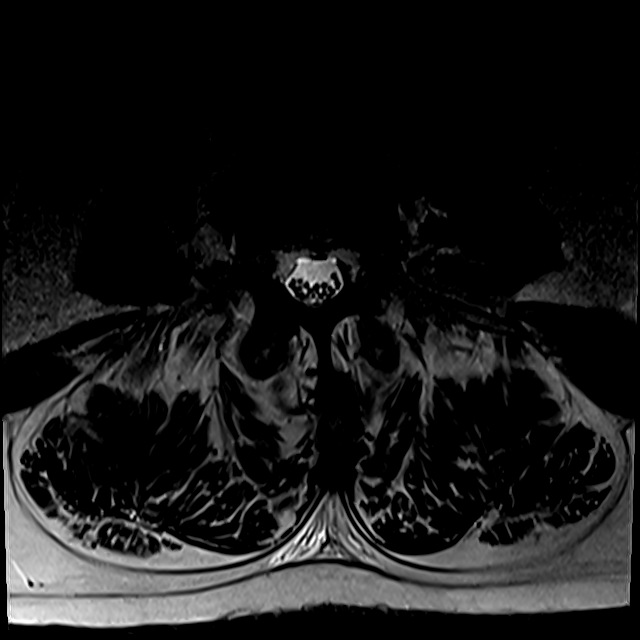
[im 23/46]
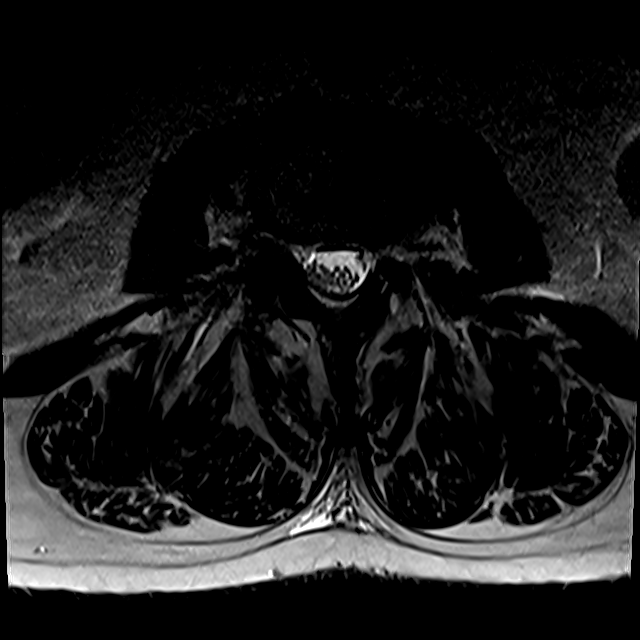
[im 26/46]
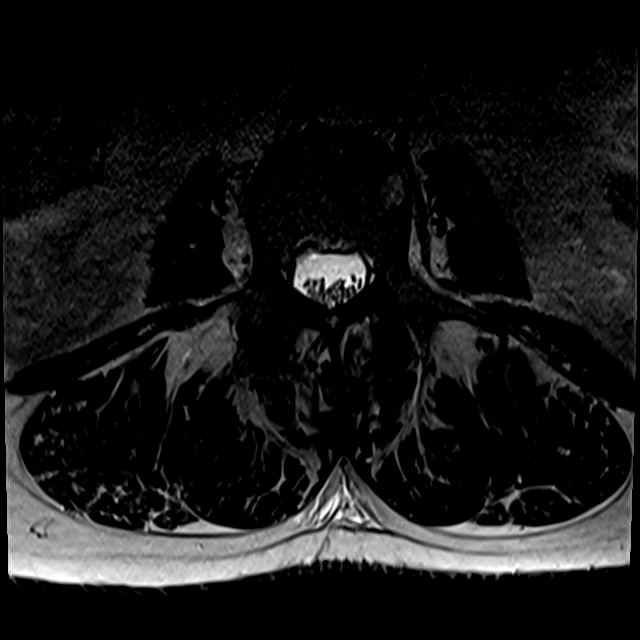
[im 39/46]
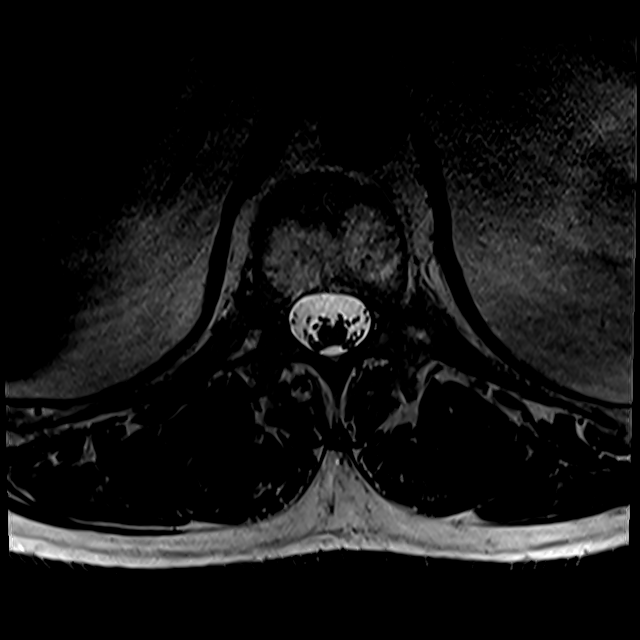

[19 of 48 positions shown; findings below may reference images not displayed]

FINDINGS: Segmentation:  Standard.

Alignment:  Unchanged grade 1 retrolisthesis of L2 on L3.

Vertebrae: No fracture, suspicious marrow lesion, or significant
marrow edema. Unchanged large hemangiomas in the T12 vertebral body.
Scattered small hemangiomas elsewhere in the lumbar spine and
sacrum. Chronic degenerative endplate changes at L2-3 and L4-5.

Conus medullaris and cauda equina: Conus extends to the L1 level.
Conus and cauda equina appear normal.

Paraspinal and other soft tissues: Unremarkable.

Disc levels:

Disc desiccation throughout the lumbar spine. Chronic severe disc
space narrowing at L4-5 and milder narrowing at L2-3 and L5-S1.

T11-12 and T12-L1: Negative.

L1-2: Mild facet hypertrophy and at most minimal disc bulging
without stenosis.

L2-3: Circumferential disc bulging eccentric to the right and mild
facet hypertrophy result in moderate right neural foraminal stenosis
without spinal stenosis, progressed from prior.

L3-4: Disc bulging and moderate facet hypertrophy result in
mild-to-moderate bilateral neural foraminal stenosis, not
significantly changed. Epidural fat is less prominent than on the
prior study, and there is no significant spinal stenosis currently.

L4-5: Disc bulging and moderate facet hypertrophy result in moderate
right and mild left neural foraminal stenosis and mild bilateral
lateral recess stenosis without significant generalized spinal
stenosis, unchanged.

L5-S1: Disc bulging and moderate right and severe left facet
hypertrophy result in mild left neural foraminal stenosis without
spinal stenosis, unchanged.
IMPRESSION: 1. Progressive, moderate right neural foraminal stenosis at L2-3.
2. Unchanged multilevel neural foraminal stenosis elsewhere,
moderate on the right at L4-5.
3. No significant spinal stenosis.

## 2022-02-08 ENCOUNTER — Encounter: Payer: Self-pay | Admitting: Adult Health

## 2022-02-08 ENCOUNTER — Ambulatory Visit (INDEPENDENT_AMBULATORY_CARE_PROVIDER_SITE_OTHER): Payer: HMO | Admitting: Adult Health

## 2022-02-08 ENCOUNTER — Other Ambulatory Visit: Payer: Self-pay

## 2022-02-08 ENCOUNTER — Other Ambulatory Visit (HOSPITAL_COMMUNITY): Payer: Self-pay

## 2022-02-08 VITALS — BP 126/68 | HR 79 | Temp 97.9°F | Ht 71.0 in | Wt 250.0 lb

## 2022-02-08 DIAGNOSIS — Z125 Encounter for screening for malignant neoplasm of prostate: Secondary | ICD-10-CM | POA: Diagnosis not present

## 2022-02-08 DIAGNOSIS — G4733 Obstructive sleep apnea (adult) (pediatric): Secondary | ICD-10-CM

## 2022-02-08 DIAGNOSIS — Z79899 Other long term (current) drug therapy: Secondary | ICD-10-CM

## 2022-02-08 DIAGNOSIS — E538 Deficiency of other specified B group vitamins: Secondary | ICD-10-CM

## 2022-02-08 DIAGNOSIS — Z0001 Encounter for general adult medical examination with abnormal findings: Secondary | ICD-10-CM

## 2022-02-08 DIAGNOSIS — E1122 Type 2 diabetes mellitus with diabetic chronic kidney disease: Secondary | ICD-10-CM

## 2022-02-08 DIAGNOSIS — E559 Vitamin D deficiency, unspecified: Secondary | ICD-10-CM | POA: Diagnosis not present

## 2022-02-08 DIAGNOSIS — N182 Chronic kidney disease, stage 2 (mild): Secondary | ICD-10-CM | POA: Diagnosis not present

## 2022-02-08 DIAGNOSIS — Z0000 Encounter for general adult medical examination without abnormal findings: Secondary | ICD-10-CM

## 2022-02-08 DIAGNOSIS — I700 Atherosclerosis of aorta: Secondary | ICD-10-CM

## 2022-02-08 DIAGNOSIS — E785 Hyperlipidemia, unspecified: Secondary | ICD-10-CM

## 2022-02-08 DIAGNOSIS — Z136 Encounter for screening for cardiovascular disorders: Secondary | ICD-10-CM

## 2022-02-08 DIAGNOSIS — E113299 Type 2 diabetes mellitus with mild nonproliferative diabetic retinopathy without macular edema, unspecified eye: Secondary | ICD-10-CM

## 2022-02-08 DIAGNOSIS — Z1329 Encounter for screening for other suspected endocrine disorder: Secondary | ICD-10-CM

## 2022-02-08 DIAGNOSIS — I10 Essential (primary) hypertension: Secondary | ICD-10-CM

## 2022-02-08 DIAGNOSIS — E1169 Type 2 diabetes mellitus with other specified complication: Secondary | ICD-10-CM | POA: Diagnosis not present

## 2022-02-08 DIAGNOSIS — E1141 Type 2 diabetes mellitus with diabetic mononeuropathy: Secondary | ICD-10-CM

## 2022-02-08 DIAGNOSIS — N521 Erectile dysfunction due to diseases classified elsewhere: Secondary | ICD-10-CM

## 2022-02-08 MED ORDER — GABAPENTIN 100 MG PO CAPS
100.0000 mg | ORAL_CAPSULE | Freq: Three times a day (TID) | ORAL | 0 refills | Status: DC | PRN
  Filled 2022-02-08: qty 90, 10d supply, fill #0

## 2022-02-08 NOTE — Progress Notes (Signed)
CPE AND FOLLOW UP Assessment:    Encounter for Annual Physical Exam with abnormal findings Due annually  Health Maintenance reviewed Healthy lifestyle reviewed and goals set Declines all vaccines today   Benign essential HTN Continue medication Monitor blood pressure at home; call if consistently over 130/80 Continue DASH diet.   Reminder to go to the ER if any CP, SOB, nausea, dizziness, severe HA, changes vision/speech, left arm numbness and tingling and jaw pain.  Aortic atherosclerosis ([place]) Per CT 2018 Control blood pressure, cholesterol, glucose, increase exercise.   Type 2 diabetes mellitus with stage 2 chronic kidney disease, without long-term current use of insulin (HCC) Would benefit from alternative therapy from glipizide, risks of hypoglycemia reviewed; he declines change in therapy at this time Education: Reviewed ABCs of diabetes management (respective goals in parentheses):  A1C (<7), blood pressure (<130/80), and cholesterol (LDL <70) Eye Exam yearly and Dental Exam every 6 months. Dietary recommendations Physical Activity recommendations -     COMPLETE METABOLIC PANEL WITH GFR -     Hemoglobin A1c  Hyperlipidemia associated with type 2 diabetes mellitus ([place]) Continue statin; titrate for LDL goal <70 Continue low cholesterol diet and exercise.  Check lipid panel.   CKD stage 2 due to type 2 diabetes mellitus (HCC) Increase fluids, avoid NSAIDS, monitor sugars, will monitor  Diabetic mononeuropathy associated with type 2 diabetes mellitus ([place])  discussed feet checks.  Gabapentin sent in to try   ED associated with T2DM ([place]) Declines meds  Diabetc retinopathy [facility]) Ophthalmology follows; control sugars  Osteoarthritis of knee, unspecified laterality, unspecified osteoarthritis type Celebrex, tylenol PRN; establisehd with ortho to follow up if needed  Lumbar spondylosis Est with Dr. Ellene Route, managing with celebrex Declines other interventions at  this time [place] loss encouraged  Vitamin D deficiency At goal at recent check; continue to recommend supplementation for goal of 60-100 Check vitamin D level  Morbid obesity - BMI 35+ with comorbidities (T2DM, thn, hyperlipidemia) Excellent progress over the last year with lifestyle modification;  Long discussion about weight loss, diet, and exercise Discussed ideal weight for height and initial weight goal (<245lb) Patient will work on increasing exercise, discussed water aerobics  Will follow up q31m  OSA on CPAP  Reports 100% compliance No needs Weight loss encouraged  Heart murmur Monitor; no concerning sx   B12 def Start 500 mcg SL daily; check levels  Orders Placed This Encounter  Procedures   CBC with Differential/Platelet   COMPLETE METABOLIC PANEL WITH GFR   Magnesium   Lipid panel   TSH   Hemoglobin A1c   VITAMIN D 25 Hydroxy (Vit-D Deficiency, Fractures)   Vitamin B12   Microalbumin / creatinine urine ratio   Urinalysis, Routine w reflex microscopic   PSA   EKG 12-Lead   HM DIABETES FOOT EXAM     Over 30 minutes of exam, counseling, chart review, and critical decision making was performed  Future Appointments  Date Time Provider [place]  05/29/2022 11:30 AM Liane Comber, NP [facility] None  09/05/2022 11:30 AM Unk Pinto, MD [facility] None  02/13/2023  3:00 PM Liane Comber, NP [facility] None     Plan:   During the course of the visit the patient was educated and counseled about appropriate screening and preventive services including:   Pneumococcal vaccine  Influenza vaccine Prevnar 13 Td vaccine Screening electrocardiogram Colorectal cancer screening Diabetes screening Glaucoma screening Nutrition counseling    Subjective:  Joshua Koch is a 76 y.o. male who presents  for CPE. 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  He is married, 2 daughters, 3rd grandchild was born 62 months ago, doing well. He is a retired Armed forces operational officer for [employer].   He follows with derm Dr. Martinique annually for non-melanoma skin cancer, hx of R temple and also recently had R wrist lesion removed.   He has chronic lumbar pain with intermittent bilateral anterior thigh numbness, has been referred to Dr. Ellene Route, did PT but this made it worse, quit after 6 sessions, doing home exercises, taking celebrex 200 mg. Was discussing further imaging/MRI and injections and ablation but doing well and holding off for now. He would prefer to avoid surgery, feels managing.   He has OSA and on CPAP, reports 100% compliance with restorative sleep.   BMI is Body mass index is 34.87 kg/m., he has been working on diet, exercise limited due to back pain and knee (follows ortho). He has been prescribed phentermine/topamax in the past but stopped due to not liking how he felt with medications. Has been doing well reducing portions of starches, etc. Weight is down from 299 lb in 2019.  He has been cutting down on sweets, admits loves food, meat and potatoes  - "I think I have a food addiction." Eats for flavor, not for hunger -  Have discussed ozempic, would consider but has declined thus far,  continues to decline today Wt Readings from Last 3 Encounters:  02/08/22 250 lb (113.4 kg)  10/19/21 250 lb (113.4 kg)  08/30/21 256 lb 6.4 oz (116.3 kg)   His blood pressure has been controlled at home, today their BP is BP: 126/68 He does not workout. He denies chest pain, shortness of breath, dizziness.   He has aortic atherosclerosis per Ct 2018  He is on cholesterol medication (simvastatin 20 mg daily) and denies myalgias. His cholesterol is not at goal. The cholesterol last visit was:   Lab Results  Component Value Date   CHOL 119 08/30/2021   HDL 35 (L) 08/30/2021   LDLCALC 61 08/30/2021   TRIG 151 (H) 08/30/2021   CHOLHDL 3.4 08/30/2021   He has been working on diet and exercise for T2 diabetes with CKD II (on metformin 2000 mg, glimepiride 2 mg BID - dose reduced to assist with weight loss), he is on bASA, he is on ACE/ARB and denies increased appetite, nausea, polydipsia, polyuria, visual disturbances and vomiting. He does have neuropathy, very poor sensation in bil feet. Has glucometer but doesn't check regularly.  He had normal ABI 08/2020. Retinopathy per Dr. Prudencio Burly, last 01/22/2022.  He has had ED for many years; numerous medications didn't help and declines further.  Discussed ozempic or trulicity - would consider but declines for now Has glucometer but doesn't check regularly.  Last A1C in the office was:  Lab Results  Component Value  Date   HGBA1C 6.5 (H) 08/30/2021   He has baseline CKD II, drop last visit, on olmesartan 40 mg. Last GFR Lab Results  Component Value Date   GFRNONAA 46 (L) 10/19/2021   GFRNONAA 76 05/10/2021   GFRNONAA 85 02/06/2021    Patient is on Vitamin D supplement, unsure of dose.  Lab Results  Component Value Date   VD25OH 73 08/30/2021     Low B12 but never started on supplement, receptive to starting -  Lab Results  Component Value Date   VITAMINB12 228 02/06/2021   He denies LUTs. Last PSA:  Lab Results  Component Value Date    PSA 1.26 02/06/2021   PSA 0.4 01/04/2020     Medication Review:  Current Outpatient Medications (Endocrine & Metabolic):    glimepiride (AMARYL) 1 MG tablet, Take 2 tablet 2 x /day with Meals for Diabetes   metFORMIN (GLUCOPHAGE-XR) 500 MG 24 hr tablet, Take  2 tablets  2 x /day  with Meals  for Diabetes  Current Outpatient Medications (Cardiovascular):    olmesartan (BENICAR) 40 MG tablet, Take 1 tablet (40 mg total) by mouth daily.   simvastatin (ZOCOR) 40 MG tablet, TAKE 1 TABLET BY MOUTH AT  BEDTIME FOR CHOLESTEROL  Current Outpatient Medications (Respiratory):    cetirizine (ZYRTEC) 10 MG tablet, Take 10 mg by mouth daily.   ipratropium (ATROVENT) 0.06 % nasal spray, USE 1 TO 2 SPRAYS EACH NOSTRIL 2 TO 3 X /DAY AS NEEDED FOR WATERY RHINORRHEA  Current Outpatient Medications (Analgesics):    aspirin (ECOTRIN LOW STRENGTH) 81 MG EC tablet, Take 1 tablet daily  Current Outpatient Medications (Hematological):    Cyanocobalamin (B-12) 500 MCG SUBL, Place 1 tablet under the tongue daily.  Current Outpatient Medications (Other):    APPLE CIDER VINEGAR PO, Take by mouth daily.   Ascorbic Acid (VITAMIN C PO), Take by mouth. 1 tablet daily   CHOLECALCIFEROL PO, Take 10,000 Units by mouth daily.   gabapentin (NEURONTIN) 100 MG capsule, Take 1 - 3 capsules by mouth 3 times daily as needed (neuropathy pain).   Magnesium 400 MG CAPS, Take 1 capsule by mouth 2 (two) times daily.   Omega-3 Fatty Acids (FISH OIL PO), Take by mouth 2 (two) times daily.   Zinc 50 MG TABS, Take by mouth daily.  Allergies: Allergies  Allergen Reactions   No Known Allergies     Current Problems (verified) has Type 2 diabetes mellitus with stage 2 chronic kidney disease, without long-term current use of insulin ([place]); Essential hypertension; Hyperlipidemia associated with type 2 diabetes mellitus ([place]); OA (osteoarthritis) of knee; Aortic atherosclerosis ([place]) by CT scan 2018; Vitamin D deficiency; Severe  obesity with body mass index (BMI) of 35.0 to 39.9 with comorbidity ([place]); CKD stage 2 due to type 2 diabetes mellitus ([place]); Inappropriate diet or eating habits; Diabetic mononeuropathy associated with type 2 diabetes mellitus ([place]); Lumbar spondylosis; 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  Screening Tests Immunization History  Administered Date(s) Administered   PFIZER Comirnaty(Gray Top)Covid-19 Tri-Sucrose Vaccine 07/19/2021   PFIZER(Purple Top)SARS-COV-2 Vaccination 01/01/2020, 01/22/2020, 09/07/2020   Pfizer Covid-19 Vaccine Bivalent Booster 20yrs & up 09/26/2021   Pneumococcal-Unspecified 05/05/2009, 03/03/2013   Td 05/05/2009   Tdap 06/21/2015   Zoster, Live 03/03/2013   Health Maintenance  Topic Date Due   INFLUENZA VACCINE  03/09/2022 (Originally 07/10/2021)   HEMOGLOBIN A1C  02/27/2022   OPHTHALMOLOGY EXAM  01/22/2023   FOOT EXAM  02/09/2023   TETANUS/TDAP  06/20/2025   COLONOSCOPY (Pts 45-57yrs Insurance coverage will need to be confirmed)  12/23/2029   COVID-19 Vaccine  Completed   Hepatitis C Screening  Completed   HPV VACCINES  Aged Out   Pneumonia Vaccine 46+ Years old  Discontinued   Zoster Vaccines- Shingrix  Discontinued   Last colonoscopy: Dr Earlean Shawl, 12/24/2019 due 2031  Influenza: Declines  Pneumococcal: 2014 Prevnar13: Declines Zostavax: 02/2013, declines shingrix  Names of Other Physician/Practitioners you currently use: 1. [facility] here for primary care 2. Eye Exam, Dr. Prudencio Burly,  Diabetic 01/22/2022,  retinopathy  3. Dental Exam, Dr. Jone Baseman, last in 2021 4. Derm, Dr. Amy Martinique, last 01/2021  Patient Care Team: Unk Pinto, MD as PCP - General (Internal Medicine) Tisovec, Fransico Him, MD (Internal  Medicine)  Surgical: He  has a past surgical history that includes Knee arthroscopy; Pilonidal cyst excision; Colonoscopy; and Total knee arthroplasty (Right, 10/21/2017). Family His family history includes CAD (age of onset: 57) in his brother; COPD in his brother; Heart attack (age of onset: 11) in his father; Lung cancer in his sister; Stroke in his brother. Social history  He reports that he has never smoked. He has never used smokeless tobacco. He reports current alcohol use of about 1.0 standard drink per week. He reports that he does not use drugs.   Review of Systems  Constitutional:  Negative for malaise/fatigue and weight loss.  HENT:  Negative for hearing loss and tinnitus.   Eyes:  Negative for blurred vision and double vision.  Respiratory:  Negative for cough, sputum production, shortness of breath and wheezing.   Cardiovascular:  Negative for chest pain, palpitations, orthopnea, claudication, leg swelling and PND.  Gastrointestinal:  Negative for abdominal pain, blood in stool, constipation, diarrhea, heartburn, melena, nausea and vomiting.  Genitourinary: Negative.   Musculoskeletal:  Positive for back pain (chronic lumbar). Negative for falls, joint pain and myalgias.  Skin:  Negative for rash.  Neurological:  Positive for tingling (bil fee). Negative for dizziness, sensory change, weakness and headaches.  Endo/Heme/Allergies:  Negative for polydipsia.  Psychiatric/Behavioral: Negative.  Negative for depression, memory loss, substance abuse and suicidal ideas. The patient is not nervous/anxious and does not have insomnia.   All other systems reviewed and are negative.    Objective:   Today's Vitals   02/08/22 1453  BP: 126/68  Pulse: 79  Temp: 97.9 F (36.6 C)  SpO2: 99%  Weight: 250 lb (113.4 kg)  Height: 5\' 11"  (1.803 m)    Body mass index is 34.87 kg/m.  General appearance: alert, no distress, WD/WN, male HEENT: normocephalic, sclerae anicteric, TMs  pearly, nares patent, no discharge or erythema, pharynx normal Oral cavity: MMM, no lesions Neck: supple, no lymphadenopathy, no thyromegaly, no masses Heart: RRR, normal S1, S2, no murmurs Lungs: CTA bilaterally, no wheezes, rhonchi, or rales Abdomen: +bs, soft, obese, non tender, non distended, no masses, no hepatomegaly, no splenomegaly. Large ventral hernia with bearing down.  Musculoskeletal: nontender, no swelling, no obvious deformity Extremities: no edema, no cyanosis, no clubbing Pulses: 2+ symmetric, upper and lower extremities, normal cap refill Neurological: alert, oriented x 3, CN2-12 intact, strength normal upper extremities and lower extremities, sensation diminished bil feet, DTRs 2+ throughout, no cerebellar signs, gait steady. Sensation severely diminished bil feet to monofilament and touch.  Psychiatric: normal affect, behavior  normal, pleasant  GU: no concerns, declines   EKG: NSR  Izora Ribas, NP   02/08/2022

## 2022-02-08 NOTE — Patient Instructions (Addendum)
?Mr. Joshua Koch , ?Thank you for taking time to come for your Annual Wellness Visit. I appreciate your ongoing commitment to your health goals. Please review the following plan we discussed and let me know if I can assist you in the future.  ? ?These are the goals we discussed: ? Goals   ? ?  Blood Pressure < 130/80   ?  DIET - INCREASE WATER INTAKE   ?  65-80+ fluid ounces daily  ?  ?  HEMOGLOBIN A1C < 7   ?  LDL CALC < 70   ?  Weight (lb) < 220 lb (99.8 kg)   ? ?  ?  ?This is a list of the screening recommended for you and due dates:  ?Health Maintenance  ?Topic Date Due  ? Flu Shot  03/09/2022*  ? Hemoglobin A1C  02/27/2022  ? Eye exam for diabetics  01/22/2023  ? Complete foot exam   02/09/2023  ? Tetanus Vaccine  06/20/2025  ? Colon Cancer Screening  12/23/2029  ? COVID-19 Vaccine  Completed  ? Hepatitis C Screening: USPSTF Recommendation to screen - Ages 74-79 yo.  Completed  ? HPV Vaccine  Aged Out  ? Pneumonia Vaccine  Discontinued  ? Zoster (Shingles) Vaccine  Discontinued  ?*Topic was postponed. The date shown is not the original due date.  ? ? ? ?Please start b12 sublingual 500 mcg daily  ? ? ? ? ?Know what a healthy weight is for you (roughly BMI <25) and aim to maintain this ? ?Aim for 7+ servings of fruits and vegetables daily ? ?65-80+ fluid ounces of water or unsweet tea for healthy kidneys ? ?Limit to max 1 drink of alcohol per day; avoid smoking/tobacco ? ?Limit animal fats in diet for cholesterol and heart health - choose grass fed whenever available ? ?Avoid highly processed foods, and foods high in saturated/trans fats ? ?Aim for low stress - take time to unwind and care for your mental health ? ?Aim for 150 min of moderate intensity exercise weekly for heart health, and weights twice weekly for bone health ? ?Aim for 7-9 hours of sleep daily ? ? ? ? ? ?Diabetes Mellitus and Foot Care ?Foot care is an important part of your health, especially when you have diabetes. Diabetes may cause you to have  problems because of poor blood flow (circulation) to your feet and legs, which can cause your skin to: ?Become thinner and drier. ?Break more easily. ?Heal more slowly. ?Peel and crack. ?You may also have nerve damage (neuropathy) in your legs and feet, causing decreased feeling in them. This means that you may not notice minor injuries to your feet that could lead to more serious problems. Noticing and addressing any potential problems early is the best way to prevent future foot problems. ?How to care for your feet ?Foot hygiene ? ?Wash your feet daily with warm water and mild soap. Do not use hot water. Then, pat your feet and the areas between your toes until they are completely dry. Do not soak your feet as this can dry your skin. ?Trim your toenails straight across. Do not dig under them or around the cuticle. File the edges of your nails with an emery board or nail file. ?Apply a moisturizing lotion or petroleum jelly to the skin on your feet and to dry, brittle toenails. Use lotion that does not contain alcohol and is unscented. Do not apply lotion between your toes. ?Shoes and socks ?Wear clean socks or  stockings every day. Make sure they are not too tight. Do not wear knee-high stockings since they may decrease blood flow to your legs. ?Wear shoes that fit properly and have enough cushioning. Always look in your shoes before you put them on to be sure there are no objects inside. ?To break in new shoes, wear them for just a few hours a day. This prevents injuries on your feet. ?Wounds, scrapes, corns, and calluses ? ?Check your feet daily for blisters, cuts, bruises, sores, and redness. If you cannot see the bottom of your feet, use a mirror or ask someone for help. ?Do not cut corns or calluses or try to remove them with medicine. ?If you find a minor scrape, cut, or break in the skin on your feet, keep it and the skin around it clean and dry. You may clean these areas with mild soap and water. Do not  clean the area with peroxide, alcohol, or iodine. ?If you have a wound, scrape, corn, or callus on your foot, look at it several times a day to make sure it is healing and not infected. Check for: ?Redness, swelling, or pain. ?Fluid or blood. ?Warmth. ?Pus or a bad smell. ?General tips ?Do not cross your legs. This may decrease blood flow to your feet. ?Do not use heating pads or hot water bottles on your feet. They may burn your skin. If you have lost feeling in your feet or legs, you may not know this is happening until it is too late. ?Protect your feet from hot and cold by wearing shoes, such as at the beach or on hot pavement. ?Schedule a complete foot exam at least once a year (annually) or more often if you have foot problems. Report any cuts, sores, or bruises to your health care provider immediately. ?Where to find more information ?American Diabetes Association: www.diabetes.org ?Association of Diabetes Care & Education Specialists: www.diabeteseducator.org ?Contact a health care provider if: ?You have a medical condition that increases your risk of infection and you have any cuts, sores, or bruises on your feet. ?You have an injury that is not healing. ?You have redness on your legs or feet. ?You feel burning or tingling in your legs or feet. ?You have pain or cramps in your legs and feet. ?Your legs or feet are numb. ?Your feet always feel cold. ?You have pain around any toenails. ?Get help right away if: ?You have a wound, scrape, corn, or callus on your foot and: ?You have pain, swelling, or redness that gets worse. ?You have fluid or blood coming from the wound, scrape, corn, or callus. ?Your wound, scrape, corn, or callus feels warm to the touch. ?You have pus or a bad smell coming from the wound, scrape, corn, or callus. ?You have a fever. ?You have a red line going up your leg. ?Summary ?Check your feet every day for blisters, cuts, bruises, sores, and redness. ?Apply a moisturizing lotion or  petroleum jelly to the skin on your feet and to dry, brittle toenails. ?Wear shoes that fit properly and have enough cushioning. ?If you have foot problems, report any cuts, sores, or bruises to your health care provider immediately. ?Schedule a complete foot exam at least once a year (annually) or more often if you have foot problems. ?This information is not intended to replace advice given to you by your health care provider. Make sure you discuss any questions you have with your health care provider. ?Document Revised: 06/16/2020 Document Reviewed: 06/16/2020 ?Elsevier  Patient Education ? [place]. ? ?

## 2022-02-09 ENCOUNTER — Other Ambulatory Visit: Payer: Self-pay | Admitting: Adult Health

## 2022-02-09 ENCOUNTER — Other Ambulatory Visit (HOSPITAL_COMMUNITY): Payer: Self-pay

## 2022-02-09 DIAGNOSIS — E875 Hyperkalemia: Secondary | ICD-10-CM

## 2022-02-09 DIAGNOSIS — E1122 Type 2 diabetes mellitus with diabetic chronic kidney disease: Secondary | ICD-10-CM

## 2022-02-09 LAB — LIPID PANEL
Cholesterol: 137 mg/dL (ref <200)
HDL: 35 mg/dL — ABNORMAL LOW (ref >=40)
LDL Cholesterol (Calc): 77 mg/dL (calc)
Non-HDL Cholesterol (Calc): 102 mg/dL (calc) (ref <130)
Total CHOL/HDL Ratio: 3.9 (calc) (ref <5.0)
Triglycerides: 150 mg/dL — ABNORMAL HIGH (ref <150)

## 2022-02-09 LAB — CBC WITH DIFFERENTIAL/PLATELET
Absolute Monocytes: 787 cells/uL (ref 200–950)
Basophils Absolute: 67 cells/uL (ref 0–200)
Basophils Relative: 0.7 %
Eosinophils Absolute: 259 cells/uL (ref 15–500)
Eosinophils Relative: 2.7 %
HCT: 43.6 % (ref 38.5–50.0)
Hemoglobin: 14.6 g/dL (ref 13.2–17.1)
Lymphs Abs: 3379 cells/uL (ref 850–3900)
MCH: 30.3 pg (ref 27.0–33.0)
MCHC: 33.5 g/dL (ref 32.0–36.0)
MCV: 90.5 fL (ref 80.0–100.0)
MPV: 10.2 fL (ref 7.5–12.5)
Monocytes Relative: 8.2 %
Neutro Abs: 5107 cells/uL (ref 1500–7800)
Neutrophils Relative %: 53.2 %
Platelets: 307 10*3/uL (ref 140–400)
RBC: 4.82 10*6/uL (ref 4.20–5.80)
RDW: 12.1 % (ref 11.0–15.0)
Total Lymphocyte: 35.2 %
WBC: 9.6 10*3/uL (ref 3.8–10.8)

## 2022-02-09 LAB — URINALYSIS, ROUTINE W REFLEX MICROSCOPIC
Bilirubin Urine: NEGATIVE
Glucose, UA: NEGATIVE
Hgb urine dipstick: NEGATIVE
Ketones, ur: NEGATIVE
Leukocytes,Ua: NEGATIVE
Nitrite: NEGATIVE
Protein, ur: NEGATIVE
Specific Gravity, Urine: 1.007 (ref 1.001–1.035)
pH: 6 (ref 5.0–8.0)

## 2022-02-09 LAB — COMPLETE METABOLIC PANEL WITH GFR
AG Ratio: 1.6 (calc) (ref 1.0–2.5)
ALT: 20 U/L (ref 9–46)
AST: 20 U/L (ref 10–35)
Albumin: 4.5 g/dL (ref 3.6–5.1)
Alkaline phosphatase (APISO): 43 U/L (ref 35–144)
BUN/Creatinine Ratio: 16 (calc) (ref 6–22)
BUN: 21 mg/dL (ref 7–25)
CO2: 26 mmol/L (ref 20–32)
Calcium: 10.2 mg/dL (ref 8.6–10.3)
Chloride: 104 mmol/L (ref 98–110)
Creat: 1.31 mg/dL — ABNORMAL HIGH (ref 0.70–1.28)
Globulin: 2.8 g/dL (calc) (ref 1.9–3.7)
Glucose, Bld: 72 mg/dL (ref 65–99)
Potassium: 5.4 mmol/L — ABNORMAL HIGH (ref 3.5–5.3)
Sodium: 139 mmol/L (ref 135–146)
Total Bilirubin: 0.3 mg/dL (ref 0.2–1.2)
Total Protein: 7.3 g/dL (ref 6.1–8.1)
eGFR: 57 mL/min/{1.73_m2} — ABNORMAL LOW (ref >=60)

## 2022-02-09 LAB — TSH: TSH: 2.39 mIU/L (ref 0.40–4.50)

## 2022-02-09 LAB — MICROALBUMIN / CREATININE URINE RATIO
Creatinine, Urine: 37 mg/dL (ref 20–320)
Microalb, Ur: <0.2 mg/dL

## 2022-02-09 LAB — HEMOGLOBIN A1C
Hgb A1c MFr Bld: 6.8 % of total Hgb — ABNORMAL HIGH (ref <5.7)
Mean Plasma Glucose: 148 mg/dL
eAG (mmol/L): 8.2 mmol/L

## 2022-02-09 LAB — MAGNESIUM: Magnesium: 2.2 mg/dL (ref 1.5–2.5)

## 2022-02-09 LAB — VITAMIN B12: Vitamin B-12: 226 pg/mL (ref 200–1100)

## 2022-02-09 LAB — PSA: PSA: 0.31 ng/mL (ref <=4.00)

## 2022-02-09 LAB — VITAMIN D 25 HYDROXY (VIT D DEFICIENCY, FRACTURES): Vit D, 25-Hydroxy: 79 ng/mL (ref 30–100)

## 2022-02-09 MED ORDER — ROSUVASTATIN CALCIUM 5 MG PO TABS
ORAL_TABLET | ORAL | 1 refills | Status: DC
  Filled 2022-02-09: qty 90, 90d supply, fill #0
  Filled 2022-05-24: qty 90, 90d supply, fill #1

## 2022-02-09 MED ORDER — GLIMEPIRIDE 2 MG PO TABS
ORAL_TABLET | ORAL | 3 refills | Status: DC
  Filled 2022-02-09: qty 180, 90d supply, fill #0
  Filled 2022-05-24: qty 180, 90d supply, fill #1

## 2022-02-22 ENCOUNTER — Ambulatory Visit: Payer: Medicare Other

## 2022-02-22 ENCOUNTER — Other Ambulatory Visit: Payer: Self-pay

## 2022-02-22 DIAGNOSIS — E875 Hyperkalemia: Secondary | ICD-10-CM | POA: Diagnosis not present

## 2022-02-22 NOTE — Progress Notes (Signed)
Patient returns for BMP w/GFR today. He reports he is not taking any NSAIDS and is drinking up to 80 fluid ounces of water/liquid a day. He reports no issues today.  ?

## 2022-02-23 LAB — BASIC METABOLIC PANEL WITH GFR
BUN: 20 mg/dL (ref 7–25)
CO2: 21 mmol/L (ref 20–32)
Calcium: 9.5 mg/dL (ref 8.6–10.3)
Chloride: 105 mmol/L (ref 98–110)
Creat: 1.21 mg/dL (ref 0.70–1.28)
Glucose, Bld: 102 mg/dL — ABNORMAL HIGH (ref 65–99)
Potassium: 5.1 mmol/L (ref 3.5–5.3)
Sodium: 138 mmol/L (ref 135–146)
eGFR: 63 mL/min/{1.73_m2} (ref >=60)

## 2022-03-22 ENCOUNTER — Other Ambulatory Visit: Payer: Self-pay | Admitting: Internal Medicine

## 2022-03-22 ENCOUNTER — Encounter: Payer: Self-pay | Admitting: Adult Health

## 2022-03-22 DIAGNOSIS — E1141 Type 2 diabetes mellitus with diabetic mononeuropathy: Secondary | ICD-10-CM

## 2022-03-22 MED ORDER — GABAPENTIN 100 MG PO CAPS: ORAL_CAPSULE | ORAL | 3 refills | Status: DC

## 2022-04-02 ENCOUNTER — Other Ambulatory Visit (HOSPITAL_COMMUNITY): Payer: Self-pay

## 2022-04-02 ENCOUNTER — Other Ambulatory Visit: Payer: Self-pay

## 2022-04-02 DIAGNOSIS — I10 Essential (primary) hypertension: Secondary | ICD-10-CM

## 2022-04-02 DIAGNOSIS — E1141 Type 2 diabetes mellitus with diabetic mononeuropathy: Secondary | ICD-10-CM

## 2022-04-02 MED ORDER — OLMESARTAN MEDOXOMIL 40 MG PO TABS
40.0000 mg | ORAL_TABLET | Freq: Every day | ORAL | 3 refills | Status: DC
  Filled 2022-04-02: qty 5, 5d supply, fill #0
  Filled 2022-04-02: qty 85, 85d supply, fill #0
  Filled 2022-07-02: qty 90, 90d supply, fill #1

## 2022-04-02 MED ORDER — GABAPENTIN 100 MG PO CAPS
ORAL_CAPSULE | ORAL | 3 refills | Status: DC
  Filled 2022-04-02 (×2): qty 270, 90d supply, fill #0

## 2022-04-03 ENCOUNTER — Other Ambulatory Visit (HOSPITAL_COMMUNITY): Payer: Self-pay

## 2022-04-05 ENCOUNTER — Encounter: Payer: Self-pay | Admitting: Nurse Practitioner

## 2022-04-05 ENCOUNTER — Ambulatory Visit (INDEPENDENT_AMBULATORY_CARE_PROVIDER_SITE_OTHER): Payer: HMO | Admitting: Nurse Practitioner

## 2022-04-05 ENCOUNTER — Other Ambulatory Visit (HOSPITAL_COMMUNITY): Payer: Self-pay

## 2022-04-05 VITALS — BP 136/64 | HR 71 | Temp 97.7°F | Wt 256.6 lb

## 2022-04-05 DIAGNOSIS — I499 Cardiac arrhythmia, unspecified: Secondary | ICD-10-CM | POA: Diagnosis not present

## 2022-04-05 DIAGNOSIS — R051 Acute cough: Secondary | ICD-10-CM | POA: Diagnosis not present

## 2022-04-05 DIAGNOSIS — R0981 Nasal congestion: Secondary | ICD-10-CM | POA: Diagnosis not present

## 2022-04-05 DIAGNOSIS — J018 Other acute sinusitis: Secondary | ICD-10-CM

## 2022-04-05 DIAGNOSIS — J029 Acute pharyngitis, unspecified: Secondary | ICD-10-CM

## 2022-04-05 MED ORDER — AZITHROMYCIN 250 MG PO TABS
ORAL_TABLET | ORAL | 1 refills | Status: DC
  Filled 2022-04-05: qty 6, 5d supply, fill #0

## 2022-04-05 NOTE — Progress Notes (Signed)
Assessment and Plan: ? ?Joshua Koch was seen today for an episodic visit. ? ?Diagnoses and all order for this visit: ? ?1. Acute non-recurrent sinusitis of other sinus ?Continue to stay well hydrated. ?Change Zyrtec to Allegra or Claritin. ?Avoid triggers. ? ?- azithromycin (ZITHROMAX) 250 MG tablet; Take 2 tablets (500 mg) on Day 1 followed by 1 tablet  (250 mg) daily until complete.  Dispense: 6 each; Refill: 1 ? ?2. Acute cough ?Continue to stay well hydrated.  ?OTC Coricidin  ? ?3. Nasal congestion ?Continue Atrovent ? ?4. Sore throat ?May gargle with warm salt water several times throughout the day. ?Throat Lozenges PRN ? ?5. Irregular heart rhythm ?PAC ?Stay well hydrated ?Avoid caffeine, stimulant  ? ?- EKG 12-Lead ? ?Continue to monitor for worsening symptoms. ? ?Notify office for further evaluation and treatment, questions or concerns if s/s fail to improve. The risks and benefits of my recommendations, as well as other treatment options were discussed with the patient today. Questions were answered. ? ?Further disposition pending results of labs. Discussed med's effects and SE's.   ? ?Over 20 minutes of exam, counseling, chart review, and critical decision making was performed.  ? ?Future Appointments  ?Date Time Provider [place]  ?05/29/2022 11:30 AM Joshua Comber, NP [facility] None  ?09/05/2022 11:30 AM Joshua Pinto, MD [facility] None  ?02/13/2023  3:00 PM Joshua Comber, NP [facility] None  ? ? ?------------------------------------------------------------------------------------------------------------------ ? ? ?HPI ?BP 136/64   Pulse 71   Temp 97.7 ?F (36.5 ?C)   Wt 256 lb 9.6 oz (116.4 kg)   SpO2 99%   BMI 35.79 kg/m?  ? ?76 y.o.male presents for productive cough that has lasted for one week.  Initially started as sore throat with nasal congestion, sinus pressure and HA.  He takes Zyrtec daily. He does not alternate antihistamine brands. He has not noticed the color of phlegm.   Endorses dyspnea, reports this is chronic in natures.  Has a hx of  childhood asthma, is no recent flares, does not use inhaler. He uses a CPAP without water (dry), has not recently changed to tubing. Recently had Covid in 09/2022, reports mild symptoms.  Is UTD with Covid vaccine and boosters. Denies acid reflux.  He is on Losartan.  BP well managed, no BLE edema.  Denies fever, chills.   ? ?He shares with me today that he is concerned for the cough because his brother had "the same issue" and died.  States that his brother was treated with a "few things" that did not work and a week later he died. ? ?Past Medical History:  ?Diagnosis Date  ? Arthritis   ? Asthma   ? childhood  ? Cancer [facility])   ? basal cell face and back  ? Diabetes mellitus   ? Diabetes mellitus type 2 with neurological manifestations ([place]) 10/04/2014  ? Dyspnea   ? Erectile dysfunction   ? GERD (gastroesophageal reflux disease)   ? Headache   ? Heart murmur   ? History of kidney stones   ? Hypercholesteremia   ? Hyperlipemia   ? Hypertension   ? Kidney stone   ? Low back pain   ? Obesity   ? Rotator cuff dysfunction   ? partial tear , left shoulder  ? Sleep apnea   ?  ? ?Allergies  ?Allergen Reactions  ? No Known Allergies   ? ? ?Current Outpatient Medications on File Prior to Visit  ?Medication Sig  ? Ascorbic Acid (VITAMIN C  PO) Take by mouth. 1 tablet daily  ? aspirin (ECOTRIN LOW STRENGTH) 81 MG EC tablet Take 1 tablet daily  ? cetirizine (ZYRTEC) 10 MG tablet Take 10 mg by mouth daily.  ? CHOLECALCIFEROL PO Take 10,000 Units by mouth daily.  ? Cyanocobalamin (B-12) 500 MCG SUBL Place 1 tablet under the tongue daily.  ? gabapentin (NEURONTIN) 100 MG capsule Take 1 capsule by mouth 3 times daily for chronic pain  ? glimepiride (AMARYL) 2 MG tablet Take 1 tablet by mouth 2 times a day with meals for diabetes  ? ipratropium (ATROVENT) 0.06 % nasal spray USE 1 TO 2 SPRAYS EACH NOSTRIL 2 TO 3 X /DAY AS NEEDED FOR WATERY RHINORRHEA  ? Magnesium 400  MG CAPS Take 1 capsule by mouth 2 (two) times daily.  ? metFORMIN (GLUCOPHAGE-XR) 500 MG 24 hr tablet Take  2 tablets  2 x /day  with Meals  for Diabetes  ? olmesartan (BENICAR) 40 MG tablet Take 1 tablet (40 mg total) by mouth daily.  ? Omega-3 Fatty Acids (FISH OIL PO) Take by mouth 2 (two) times daily.  ? rosuvastatin (CRESTOR) 5 MG tablet Take 1 tablet by mouth nightly for cholesterol.  ? Zinc 50 MG TABS Take by mouth daily.  ? APPLE CIDER VINEGAR PO Take by mouth daily. (Patient not taking: Reported on 04/05/2022)  ? ?No current facility-administered medications on file prior to visit.  ? ? ?ROS: all negative except what is noted in the HPI.  ? ?Physical Exam: ? ?BP 136/64   Pulse 71   Temp 97.7 ?F (36.5 ?C)   Wt 256 lb 9.6 oz (116.4 kg)   SpO2 99%   BMI 35.79 kg/m?  ? ?General Appearance: NAD.  Awake, conversant and cooperative. ?Eyes: PERRLA, EOMs intact.  Sclera white.  Conjunctiva without erythema. ?Sinuses: No frontal/maxillary tenderness.  No nasal discharge. Nares patent.  ?ENT/Mouth: Ext aud canals clear.  Bilateral TMs w/DOL and without erythema or bulging. Hearing intact.  Posterior pharynx without swelling or exudate.  Tonsils without swelling or erythema.  ?Neck: Supple.  No masses, nodules or thyromegaly. ?Respiratory: Effort is regular with non-labored breathing. Breath sounds are equal bilaterally without rales, rhonchi, wheezing or stridor.  ?Cardio: Irregularly irregular.  Brisk peripheral pulses without edema.  ?Abdomen: Active BS in all four quadrants.  Soft and non-tender without guarding, rebound tenderness, hernias or masses. ?Lymphatics: Non tender without lymphadenopathy.  ?Musculoskeletal: Full ROM, 5/5 strength, normal ambulation.  No clubbing or cyanosis. ?Skin: Appropriate color for ethnicity. Warm without rashes, lesions, ecchymosis, ulcers.  ?Neuro: CN II-XII grossly normal. Normal muscle tone without cerebellar symptoms and intact sensation.   ?Psych: AO X 3,  appropriate mood  and affect, insight and judgment.  ?  ? ?Joshua Jump, NP ?3:49 PM ?[facility] ? ?

## 2022-04-25 ENCOUNTER — Encounter: Payer: Self-pay | Admitting: Adult Health

## 2022-04-25 ENCOUNTER — Other Ambulatory Visit: Payer: Self-pay | Admitting: Nurse Practitioner

## 2022-04-25 ENCOUNTER — Other Ambulatory Visit (HOSPITAL_COMMUNITY): Payer: Self-pay

## 2022-04-25 DIAGNOSIS — N182 Chronic kidney disease, stage 2 (mild): Secondary | ICD-10-CM

## 2022-04-25 MED ORDER — METFORMIN HCL ER 500 MG PO TB24
ORAL_TABLET | ORAL | 3 refills | Status: DC
Start: 2022-04-25 — End: 2023-05-06
  Filled 2022-04-25: qty 360, 60d supply, fill #0
  Filled 2022-08-06: qty 360, 90d supply, fill #1
  Filled 2022-10-30: qty 360, 90d supply, fill #2
  Filled 2023-01-26 (×2): qty 360, 90d supply, fill #3

## 2022-05-10 ENCOUNTER — Ambulatory Visit: Payer: Medicare Other | Admitting: Adult Health

## 2022-05-24 ENCOUNTER — Other Ambulatory Visit (HOSPITAL_COMMUNITY): Payer: Self-pay

## 2022-05-25 NOTE — Progress Notes (Signed)
CPE AND FOLLOW UP Assessment:    Encounter for Annual Physical Exam with abnormal findings Due annually  Health Maintenance reviewed Healthy lifestyle reviewed and goals set Declines all vaccines today   Benign essential HTN Continue medication Monitor blood pressure at home; call if consistently over 130/80 Continue DASH diet.   Reminder to go to the ER if any CP, SOB, nausea, dizziness, severe HA, changes vision/speech, left arm numbness and tingling and jaw pain.  Aortic atherosclerosis ([place]) Per CT 2018 Control blood pressure, cholesterol, glucose, increase exercise.   Type 2 diabetes mellitus with stage 2 chronic kidney disease, without long-term current use of insulin (HCC) Would benefit from alternative therapy from glipizide, risks of hypoglycemia reviewed; he declines change in therapy at this time Education: Reviewed 'ABCs' of diabetes management (respective goals in parentheses):  A1C (<7), blood pressure (<130/80), and cholesterol (LDL <70) Eye Exam yearly and Dental Exam every 6 months. Dietary recommendations Physical Activity recommendations -     COMPLETE METABOLIC PANEL WITH GFR -     Hemoglobin A1c  Hyperlipidemia associated with type 2 diabetes mellitus ([place]) Continue statin; titrate for LDL goal <70 Continue low cholesterol diet and exercise.  Check lipid panel.   CKD stage 2 due to type 2 diabetes mellitus (HCC) Increase fluids, avoid NSAIDS, monitor sugars, will monitor  Diabetic mononeuropathy associated with type 2 diabetes mellitus ([place])  discussed feet checks.  Gabapentin sent in to try   ED associated with T2DM ([place]) Declines meds  Diabetc retinopathy [facility]) Ophthalmology follows; control sugars  Osteoarthritis of knee, unspecified laterality, unspecified osteoarthritis type Celebrex, tylenol PRN; establisehd with ortho to follow up if needed  Lumbar spondylosis Est with Dr. Ellene Route, managing with celebrex Declines other interventions at  this time [place] loss encouraged  Vitamin D deficiency At goal at recent check; continue to recommend supplementation for goal of 60-100 Check vitamin D level  Morbid obesity - BMI 35+ with comorbidities (T2DM, thn, hyperlipidemia) Excellent progress over the last year with lifestyle modification;  Long discussion about weight loss, diet, and exercise Discussed ideal weight for height and initial weight goal (<245lb) Patient will work on increasing exercise, discussed water aerobics  Will follow up q71m OSA on CPAP  Reports 100% compliance No needs Weight loss encouraged  Heart murmur Monitor; no concerning sx   B12 def Start 500 mcg SL daily; check levels  No orders of the defined types were placed in this encounter.    Over 30 minutes of exam, counseling, chart review, and critical decision making was performed  Future Appointments  Date Time Provider [place] 05/29/2022 11:30 AM CLiane Comber NP [facility] None  09/05/2022 11:30 AM MUnk Pinto MD [facility] None  02/13/2023  3:00 PM CDarrol Jump NP [facility] None     Plan:   During the course of the visit the patient was educated and counseled about appropriate screening and preventive services including:   Pneumococcal vaccine  Influenza vaccine Prevnar 13 Td vaccine Screening electrocardiogram Colorectal cancer screening Diabetes screening Glaucoma screening Nutrition counseling    Subjective:  Joshua GALINDOis a 76y.o. male who presents for CPE. 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  He is married, 2 daughters, 3rd grandchild was born 33 months ago, doing well. He is a retired Armed forces operational officer for [employer].   He follows with derm Dr. Martinique annually for non-melanoma skin cancer, hx of R temple and also recently had R wrist lesion removed.   He has chronic lumbar pain with intermittent bilateral anterior thigh numbness, has been referred to Dr. Ellene Route, did PT but this made it worse, quit after 6 sessions, doing home exercises, taking celebrex 200 mg. Was discussing further imaging/MRI and injections and ablation but doing well and holding off for now. He would prefer to avoid surgery, feels managing.   He has OSA and on CPAP, reports 100% compliance with restorative sleep.   BMI is There is no height or weight on file to calculate BMI., he has been working on diet, exercise limited due to back pain and knee (follows ortho). He has been prescribed phentermine/topamax in the past but stopped due to not liking how he felt with medications. Has been doing well reducing portions of starches, etc. Weight is down from 299 lb in 2019.  He has been cutting down on sweets, admits loves food, meat and potatoes  - "I think I have a food addiction." Eats for flavor, not for hunger -  Have discussed ozempic, would consider but has declined thus far, continues to decline today Wt Readings from Last 3 Encounters:  04/05/22 256 lb 9.6 oz (116.4 kg)  02/22/22 255 lb 6.4 oz (115.8 kg)  02/08/22 250 lb (113.4 kg)   His blood pressure has been controlled at home, today their BP is   He does not workout. He denies chest pain, shortness of breath, dizziness.    He has aortic atherosclerosis per Ct 2018  He is on cholesterol medication (simvastatin 20 mg daily) and denies myalgias. His cholesterol is not at goal. The cholesterol last visit was:   Lab Results  Component Value Date   CHOL 137 02/08/2022   HDL 35 (L) 02/08/2022   LDLCALC 77 02/08/2022   TRIG 150 (H) 02/08/2022   CHOLHDL 3.9 02/08/2022   He has been working on diet and exercise for T2 diabetes with CKD II (on metformin 2000 mg, glimepiride 2 mg BID - dose reduced to assist with weight loss), he is on bASA, he is on ACE/ARB and denies increased appetite, nausea, polydipsia, polyuria, visual disturbances and vomiting. He does have neuropathy, very poor sensation in bil feet. Has glucometer but doesn't check regularly.  He had normal ABI 08/2020. Retinopathy per Dr. Prudencio Burly, last 01/22/2022.  He has had ED for many years; numerous medications didn't help and declines further.  Discussed ozempic or trulicity - would consider but declines for now Has glucometer but doesn't check regularly.  Last A1C in the office was:  Lab Results  Component Value Date   HGBA1C 6.8 (H) 02/08/2022   He has baseline CKD II, drop last visit, on olmesartan 40 mg. Last GFR Lab Results  Component Value Date   GFRNONAA 46 (L) 10/19/2021   GFRNONAA 76 05/10/2021   GFRNONAA 85 02/06/2021    Patient is on Vitamin D supplement, unsure of dose.  Lab Results  Component Value Date   VD25OH 79 02/08/2022     Low B12 but never started on supplement, receptive to starting -  Lab Results  Component Value Date   VITAMINB12 226 02/08/2022   He denies LUTs. Last PSA:  Lab Results  Component Value Date   PSA 0.31 02/08/2022   PSA 1.26 02/06/2021   PSA 0.4 01/04/2020     Medication Review:  Current Outpatient Medications (Endocrine & Metabolic):  .  glimepiride (AMARYL) 2 MG tablet, Take 1 tablet by mouth 2 times a day with a meal for diabetes .  metFORMIN (GLUCOPHAGE-XR) 500 MG 24 hr tablet, Take 2  tablets by mouth twice daily with a meal for diabetes.  Current Outpatient Medications (Cardiovascular):  .  olmesartan (BENICAR) 40 MG tablet, Take 1 tablet (40 mg total) by mouth daily. .  rosuvastatin (CRESTOR) 5 MG tablet, Take 1 tablet by mouth nightly for cholesterol.  Current Outpatient Medications (Respiratory):  .  cetirizine (ZYRTEC) 10 MG tablet, Take 10 mg by mouth daily. Marland Kitchen  ipratropium (ATROVENT) 0.06 % nasal spray, USE 1 TO 2 SPRAYS EACH NOSTRIL 2 TO 3 X /DAY AS NEEDED FOR WATERY RHINORRHEA  Current Outpatient Medications (Analgesics):  .  aspirin (ECOTRIN LOW STRENGTH) 81 MG EC tablet, Take 1 tablet daily  Current Outpatient Medications (Hematological):  Marland Kitchen  Cyanocobalamin (B-12) 500 MCG SUBL, Place 1 tablet under the tongue daily.  Current Outpatient Medications (Other):  Marland Kitchen  APPLE CIDER VINEGAR PO, Take by mouth daily. (Patient not taking: Reported on 04/05/2022) .  Ascorbic Acid (VITAMIN C PO), Take by mouth. 1 tablet daily .  azithromycin (ZITHROMAX) 250 MG tablet, Take 2 tablets (500 mg) on day 1 followed by 1 tablet  (250 mg) daily until complete. .  CHOLECALCIFEROL PO, Take 10,000 Units by mouth daily. Marland Kitchen  gabapentin (NEURONTIN) 100 MG capsule, Take 1 capsule by mouth 3 times daily for chronic pain .  Magnesium 400 MG CAPS, Take 1 capsule by mouth 2 (two) times daily. .  Omega-3 Fatty Acids (FISH OIL PO), Take by mouth 2 (two) times daily. .  Zinc 50 MG TABS, Take by mouth daily.  Allergies: Allergies  Allergen Reactions  . No Known Allergies     Current Problems (verified) has Type 2 diabetes mellitus with stage 2 chronic kidney disease, without long-term current use of insulin ([place]); Essential hypertension; Hyperlipidemia associated with type 2 diabetes mellitus ([place]); OA (osteoarthritis) of knee; Aortic atherosclerosis ([place]) by CT scan 2018; Vitamin D deficiency; Morbid obesity ([place]) - BMI 30+ with OSA; CKD stage 2 due to type 2 diabetes mellitus ([place]);  Inappropriate diet or eating habits; Diabetic mononeuropathy associated with type 2 diabetes mellitus ([place]); Lumbar spondylosis; 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  Screening Tests Immunization History  Administered Date(s) Administered  . PFIZER Comirnaty([place])Covid-19 Tri-Sucrose Vaccine 07/19/2021  . PFIZER([place])SARS-COV-2 Vaccination 01/01/2020, 01/22/2020, 09/07/2020  . Pension scheme manager 48yr & up 09/26/2021  . Pneumococcal-Unspecified 05/05/2009, 03/03/2013  . Td 05/05/2009  . Tdap 06/21/2015  . Zoster, Live 03/03/2013   Health Maintenance  Topic Date Due  . INFLUENZA VACCINE  07/10/2022  . HEMOGLOBIN A1C  08/11/2022  . OPHTHALMOLOGY EXAM  01/22/2023  . FOOT EXAM  02/09/2023  . TETANUS/TDAP  06/20/2025  . COLONOSCOPY (Pts [id number] coverage will need to  be confirmed)  12/23/2029  . COVID-19 Vaccine  Completed  . Hepatitis C Screening  Completed  . HPV VACCINES  Aged Out  . Pneumonia Vaccine 20+ Years old  Discontinued  . Zoster Vaccines- Shingrix  Discontinued   Last colonoscopy: Dr Earlean Shawl, 12/24/2019 due 2031  Influenza: Declines  Pneumococcal: 2014 Prevnar13: Declines Zostavax: 02/2013, declines shingrix  Names of Other Physician/Practitioners you currently use: 1.  [facility] here for primary care 2. Eye Exam, Dr. Prudencio Burly,  Diabetic 01/22/2022,  retinopathy  3. Dental Exam, Dr. Jone Baseman, last in 2021 4. Derm, Dr. Amy Martinique, last 01/2021  Patient Care Team: Unk Pinto, MD as PCP - General (Internal Medicine) Tisovec, Fransico Him, MD (Internal Medicine)  Surgical: He  has a past surgical history that includes Knee arthroscopy; Pilonidal cyst excision; Colonoscopy; and  Total knee arthroplasty (Right, 10/21/2017). Family His family history includes CAD (age of onset: 27) in his brother; COPD in his brother; Heart attack (age of onset: 17) in his father; Lung cancer in his sister; Stroke in his brother. Social history  He reports that he has never smoked. He has never used smokeless tobacco. He reports current alcohol use of about 1.0 standard drink of alcohol per week. He reports that he does not use drugs.   Review of Systems  Constitutional:  Negative for malaise/fatigue and weight loss.  HENT:  Negative for hearing loss and tinnitus.   Eyes:  Negative for blurred vision and double vision.  Respiratory:  Negative for cough, sputum production, shortness of breath and wheezing.   Cardiovascular:  Negative for chest pain, palpitations, orthopnea, claudication, leg swelling and PND.  Gastrointestinal:  Negative for abdominal pain, blood in stool, constipation, diarrhea, heartburn, melena, nausea and vomiting.  Genitourinary: Negative.   Musculoskeletal:  Positive for back pain (chronic lumbar). Negative for falls, joint pain and myalgias.  Skin:  Negative for rash.  Neurological:  Positive for tingling (bil fee). Negative for dizziness, sensory change, weakness and headaches.  Endo/Heme/Allergies:  Negative for polydipsia.  Psychiatric/Behavioral: Negative.  Negative for depression, memory loss, substance abuse and suicidal ideas. The patient is not nervous/anxious and does not have insomnia.   All other systems reviewed and are negative.    Objective:   There were no vitals filed for this visit.   There is no height or weight on file to calculate BMI.  General appearance: alert, no distress, WD/WN, male HEENT: normocephalic, sclerae anicteric, TMs pearly, nares patent, no discharge or erythema, pharynx normal Oral cavity: MMM, no lesions Neck: supple, no lymphadenopathy, no thyromegaly, no masses Heart: RRR, normal S1, S2, no murmurs Lungs: CTA  bilaterally, no wheezes, rhonchi, or rales Abdomen: +bs, soft, obese, non tender, non distended, no masses, no hepatomegaly, no splenomegaly. Large ventral hernia with bearing down.  Musculoskeletal: nontender, no swelling, no obvious deformity Extremities: no edema, no cyanosis, no clubbing Pulses: 2+ symmetric, upper and lower extremities, normal cap refill Neurological: alert, oriented x 3, CN2-12 intact, strength normal upper extremities and lower extremities, sensation diminished bil feet, DTRs 2+ throughout, no cerebellar signs, gait steady. Sensation severely diminished bil feet to monofilament and touch.  Psychiatric: normal affect, behavior normal, pleasant  GU: no concerns, declines   EKG: NSR  Izora Ribas, NP   05/25/2022

## 2022-05-29 ENCOUNTER — Ambulatory Visit (INDEPENDENT_AMBULATORY_CARE_PROVIDER_SITE_OTHER): Payer: HMO | Admitting: Adult Health

## 2022-05-29 ENCOUNTER — Encounter: Payer: Self-pay | Admitting: Adult Health

## 2022-05-29 ENCOUNTER — Other Ambulatory Visit (HOSPITAL_COMMUNITY): Payer: Self-pay

## 2022-05-29 VITALS — BP 120/70 | HR 66 | Temp 96.9°F | Wt 257.0 lb

## 2022-05-29 DIAGNOSIS — N182 Chronic kidney disease, stage 2 (mild): Secondary | ICD-10-CM

## 2022-05-29 DIAGNOSIS — E1141 Type 2 diabetes mellitus with diabetic mononeuropathy: Secondary | ICD-10-CM | POA: Diagnosis not present

## 2022-05-29 DIAGNOSIS — E1169 Type 2 diabetes mellitus with other specified complication: Secondary | ICD-10-CM

## 2022-05-29 DIAGNOSIS — Z0000 Encounter for general adult medical examination without abnormal findings: Secondary | ICD-10-CM

## 2022-05-29 DIAGNOSIS — E113299 Type 2 diabetes mellitus with mild nonproliferative diabetic retinopathy without macular edema, unspecified eye: Secondary | ICD-10-CM | POA: Diagnosis not present

## 2022-05-29 DIAGNOSIS — Z0001 Encounter for general adult medical examination with abnormal findings: Secondary | ICD-10-CM

## 2022-05-29 DIAGNOSIS — E1122 Type 2 diabetes mellitus with diabetic chronic kidney disease: Secondary | ICD-10-CM

## 2022-05-29 DIAGNOSIS — R6889 Other general symptoms and signs: Secondary | ICD-10-CM | POA: Diagnosis not present

## 2022-05-29 DIAGNOSIS — E559 Vitamin D deficiency, unspecified: Secondary | ICD-10-CM | POA: Diagnosis not present

## 2022-05-29 DIAGNOSIS — I10 Essential (primary) hypertension: Secondary | ICD-10-CM

## 2022-05-29 DIAGNOSIS — Z79899 Other long term (current) drug therapy: Secondary | ICD-10-CM

## 2022-05-29 DIAGNOSIS — Z85828 Personal history of other malignant neoplasm of skin: Secondary | ICD-10-CM | POA: Diagnosis not present

## 2022-05-29 DIAGNOSIS — H9193 Unspecified hearing loss, bilateral: Secondary | ICD-10-CM

## 2022-05-29 DIAGNOSIS — E785 Hyperlipidemia, unspecified: Secondary | ICD-10-CM

## 2022-05-29 DIAGNOSIS — E538 Deficiency of other specified B group vitamins: Secondary | ICD-10-CM

## 2022-05-29 DIAGNOSIS — G4733 Obstructive sleep apnea (adult) (pediatric): Secondary | ICD-10-CM

## 2022-05-29 DIAGNOSIS — I700 Atherosclerosis of aorta: Secondary | ICD-10-CM | POA: Diagnosis not present

## 2022-05-29 DIAGNOSIS — Z9989 Dependence on other enabling machines and devices: Secondary | ICD-10-CM

## 2022-05-29 DIAGNOSIS — N521 Erectile dysfunction due to diseases classified elsewhere: Secondary | ICD-10-CM

## 2022-05-29 MED ORDER — OZEMPIC (0.25 OR 0.5 MG/DOSE) 2 MG/3ML ~~LOC~~ SOPN
PEN_INJECTOR | SUBCUTANEOUS | 0 refills | Status: DC
  Filled 2022-05-29: qty 3, 42d supply, fill #0

## 2022-05-29 NOTE — Patient Instructions (Addendum)
Joshua Koch , Thank you for taking time to come for your Medicare Wellness Visit. I appreciate your ongoing commitment to your health goals. Please review the following plan we discussed and let me know if I can assist you in the future.   These are the goals we discussed:  Goals      Blood Pressure < 130/80     DIET - INCREASE WATER INTAKE     65-80+ fluid ounces daily      HEMOGLOBIN A1C < 7     LDL CALC < 70     Weight (lb) < 220 lb (99.8 kg)        This is a list of the screening recommended for you and due dates:  Health Maintenance  Topic Date Due   Flu Shot  07/10/2022   Hemoglobin A1C  08/11/2022   Eye exam for diabetics  01/22/2023   Complete foot exam   02/09/2023   Tetanus Vaccine  06/20/2025   Colon Cancer Screening  12/23/2029   COVID-19 Vaccine  Completed   Hepatitis C Screening: USPSTF Recommendation to screen - Ages 18-79 yo.  Completed   HPV Vaccine  Aged Out   Pneumonia Vaccine  Discontinued   Zoster (Shingles) Vaccine  Discontinued         Semaglutide Injection What is this medication? SEMAGLUTIDE (SEM a GLOO tide) treats type 2 diabetes. It works by increasing insulin levels in your body, which decreases your blood sugar (glucose). It also reduces the amount of sugar released into the blood and slows down your digestion. It can also be used to lower the risk of heart attack and stroke in people with type 2 diabetes. Changes to diet and exercise are often combined with this medication. This medicine may be used for other purposes; ask your health care provider or pharmacist if you have questions. COMMON BRAND NAME(S): OZEMPIC What should I tell my care team before I take this medication? They need to know if you have any of these conditions: Endocrine tumors (MEN 2) or if someone in your family had these tumors Eye disease, vision problems History of pancreatitis Kidney disease Stomach problems Thyroid cancer or if someone in your family had thyroid  cancer An unusual or allergic reaction to semaglutide, other medications, foods, dyes, or preservatives Pregnant or trying to get pregnant Breast-feeding How should I use this medication? This medication is for injection under the skin of your upper leg (thigh), stomach area, or upper arm. It is given once every week (every 7 days). You will be taught how to prepare and give this medication. Use exactly as directed. Take your medication at regular intervals. Do not take it more often than directed. If you use this medication with insulin, you should inject this medication and the insulin separately. Do not mix them together. Do not give the injections right next to each other. Change (rotate) injection sites with each injection. It is important that you put your used needles and syringes in a special sharps container. Do not put them in a trash can. If you do not have a sharps container, call your pharmacist or care team to get one. A special MedGuide will be given to you by the pharmacist with each prescription and refill. Be sure to read this information carefully each time. This medication comes with INSTRUCTIONS FOR USE. Ask your pharmacist for directions on how to use this medication. Read the information carefully. Talk to your pharmacist or care team if you  have questions. Talk to your care team about the use of this medication in children. Special care may be needed. Overdosage: If you think you have taken too much of this medicine contact a poison control center or emergency room at once. NOTE: This medicine is only for you. Do not share this medicine with others. What if I miss a dose? If you miss a dose, take it as soon as you can within 5 days after the missed dose. Then take your next dose at your regular weekly time. If it has been longer than 5 days after the missed dose, do not take the missed dose. Take the next dose at your regular time. Do not take double or extra doses. If you have  questions about a missed dose, contact your care team for advice. What may interact with this medication? Other medications for diabetes Many medications may cause changes in blood sugar, these include: Alcohol containing beverages Antiviral medications for HIV or AIDS Aspirin and aspirin-like medications Certain medications for blood pressure, heart disease, irregular heart beat Chromium Diuretics Male hormones, such as estrogens or progestins, birth control pills Fenofibrate Gemfibrozil Isoniazid Lanreotide Male hormones or anabolic steroids MAOIs like Carbex, Eldepryl, Marplan, Nardil, and Parnate Medications for weight loss Medications for allergies, asthma, cold, or cough Medications for depression, anxiety, or psychotic disturbances Niacin Nicotine NSAIDs, medications for pain and inflammation, like ibuprofen or naproxen Octreotide Pasireotide Pentamidine Phenytoin Probenecid Quinolone antibiotics such as ciprofloxacin, levofloxacin, ofloxacin Some herbal dietary supplements Steroid medications such as prednisone or cortisone Sulfamethoxazole; trimethoprim Thyroid hormones Some medications can hide the warning symptoms of low blood sugar (hypoglycemia). You may need to monitor your blood sugar more closely if you are taking one of these medications. These include: Beta-blockers, often used for high blood pressure or heart problems (examples include atenolol, metoprolol, propranolol) Clonidine Guanethidine Reserpine This list may not describe all possible interactions. Give your health care provider a list of all the medicines, herbs, non-prescription drugs, or dietary supplements you use. Also tell them if you smoke, drink alcohol, or use illegal drugs. Some items may interact with your medicine. What should I watch for while using this medication? Visit your care team for regular checks on your progress. Drink plenty of fluids while taking this medication. Check with  your care team if you get an attack of severe diarrhea, nausea, and vomiting. The loss of too much body fluid can make it dangerous for you to take this medication. A test called the HbA1C (A1C) will be monitored. This is a simple blood test. It measures your blood sugar control over the last 2 to 3 months. You will receive this test every 3 to 6 months. Learn how to check your blood sugar. Learn the symptoms of low and high blood sugar and how to manage them. Always carry a quick-source of sugar with you in case you have symptoms of low blood sugar. Examples include hard sugar candy or glucose tablets. Make sure others know that you can choke if you eat or drink when you develop serious symptoms of low blood sugar, such as seizures or unconsciousness. They must get medical help at once. Tell your care team if you have high blood sugar. You might need to change the dose of your medication. If you are sick or exercising more than usual, you might need to change the dose of your medication. Do not skip meals. Ask your care team if you should avoid alcohol. Many nonprescription cough and cold  products contain sugar or alcohol. These can affect blood sugar. Pens should never be shared. Even if the needle is changed, sharing may result in passing of viruses like hepatitis or HIV. Wear a medical ID bracelet or chain, and carry a card that describes your disease and details of your medication and dosage times. Do not become pregnant while taking this medication. Women should inform their care team if they wish to become pregnant or think they might be pregnant. There is a potential for serious side effects to an unborn child. Talk to your care team for more information. What side effects may I notice from receiving this medication? Side effects that you should report to your care team as soon as possible: Allergic reactions--skin rash, itching, hives, swelling of the face, lips, tongue, or throat Change in  vision Dehydration--increased thirst, dry mouth, feeling faint or lightheaded, headache, dark yellow or brown urine Gallbladder problems--severe stomach pain, nausea, vomiting, fever Heart palpitations--rapid, pounding, or irregular heartbeat Kidney injury--decrease in the amount of urine, swelling of the ankles, hands, or feet Pancreatitis--severe stomach pain that spreads to your back or gets worse after eating or when touched, fever, nausea, vomiting Thyroid cancer--new mass or lump in the neck, pain or trouble swallowing, trouble breathing, hoarseness Side effects that usually do not require medical attention (report to your care team if they continue or are bothersome): Diarrhea Loss of appetite Nausea Stomach pain Vomiting This list may not describe all possible side effects. Call your doctor for medical advice about side effects. You may report side effects to FDA at 1-800-FDA-1088. Where should I keep my medication? Keep out of the reach of children. Store unopened pens in a refrigerator between 2 and 8 degrees C (36 and 46 degrees F). Do not freeze. Protect from light and heat. After you first use the pen, it can be stored for 56 days at room temperature between 15 and 30 degrees C (59 and 86 degrees F) or in a refrigerator. Throw away your used pen after 56 days or after the expiration date, whichever comes first. Do not store your pen with the needle attached. If the needle is left on, medication may leak from the pen. NOTE: This sheet is a summary. It may not cover all possible information. If you have questions about this medicine, talk to your doctor, pharmacist, or health care provider.  2023 Elsevier/Gold Standard (2021-03-02 00:00:00)

## 2022-05-30 ENCOUNTER — Other Ambulatory Visit: Payer: Self-pay | Admitting: Adult Health

## 2022-05-30 ENCOUNTER — Other Ambulatory Visit (HOSPITAL_COMMUNITY): Payer: Self-pay

## 2022-05-30 DIAGNOSIS — E875 Hyperkalemia: Secondary | ICD-10-CM

## 2022-05-30 DIAGNOSIS — E1169 Type 2 diabetes mellitus with other specified complication: Secondary | ICD-10-CM

## 2022-05-30 LAB — COMPLETE METABOLIC PANEL WITH GFR
AG Ratio: 1.5 (calc) (ref 1.0–2.5)
ALT: 19 U/L (ref 9–46)
AST: 16 U/L (ref 10–35)
Albumin: 4.4 g/dL (ref 3.6–5.1)
Alkaline phosphatase (APISO): 38 U/L (ref 35–144)
BUN/Creatinine Ratio: 17 (calc) (ref 6–22)
BUN: 24 mg/dL (ref 7–25)
CO2: 23 mmol/L (ref 20–32)
Calcium: 9.8 mg/dL (ref 8.6–10.3)
Chloride: 106 mmol/L (ref 98–110)
Creat: 1.38 mg/dL — ABNORMAL HIGH (ref 0.70–1.28)
Globulin: 2.9 g/dL (calc) (ref 1.9–3.7)
Glucose, Bld: 111 mg/dL — ABNORMAL HIGH (ref 65–99)
Potassium: 5.6 mmol/L — ABNORMAL HIGH (ref 3.5–5.3)
Sodium: 140 mmol/L (ref 135–146)
Total Bilirubin: 0.4 mg/dL (ref 0.2–1.2)
Total Protein: 7.3 g/dL (ref 6.1–8.1)
eGFR: 54 mL/min/{1.73_m2} — ABNORMAL LOW (ref >=60)

## 2022-05-30 LAB — CBC WITH DIFFERENTIAL/PLATELET
Absolute Monocytes: 689 cells/uL (ref 200–950)
Basophils Absolute: 66 cells/uL (ref 0–200)
Basophils Relative: 0.8 %
Eosinophils Absolute: 172 cells/uL (ref 15–500)
Eosinophils Relative: 2.1 %
HCT: 43.7 % (ref 38.5–50.0)
Hemoglobin: 15 g/dL (ref 13.2–17.1)
Lymphs Abs: 2157 cells/uL (ref 850–3900)
MCH: 30.6 pg (ref 27.0–33.0)
MCHC: 34.3 g/dL (ref 32.0–36.0)
MCV: 89.2 fL (ref 80.0–100.0)
MPV: 10.5 fL (ref 7.5–12.5)
Monocytes Relative: 8.4 %
Neutro Abs: 5117 cells/uL (ref 1500–7800)
Neutrophils Relative %: 62.4 %
Platelets: 272 10*3/uL (ref 140–400)
RBC: 4.9 10*6/uL (ref 4.20–5.80)
RDW: 12.2 % (ref 11.0–15.0)
Total Lymphocyte: 26.3 %
WBC: 8.2 10*3/uL (ref 3.8–10.8)

## 2022-05-30 LAB — LIPID PANEL
Cholesterol: 144 mg/dL (ref <200)
HDL: 36 mg/dL — ABNORMAL LOW (ref >=40)
LDL Cholesterol (Calc): 78 mg/dL (calc)
Non-HDL Cholesterol (Calc): 108 mg/dL (calc) (ref <130)
Total CHOL/HDL Ratio: 4 (calc) (ref <5.0)
Triglycerides: 205 mg/dL — ABNORMAL HIGH (ref <150)

## 2022-05-30 LAB — HEMOGLOBIN A1C
Hgb A1c MFr Bld: 6.7 % of total Hgb — ABNORMAL HIGH (ref <5.7)
Mean Plasma Glucose: 146 mg/dL
eAG (mmol/L): 8.1 mmol/L

## 2022-05-30 LAB — TSH: TSH: 3.01 mIU/L (ref 0.40–4.50)

## 2022-05-30 LAB — MAGNESIUM: Magnesium: 2.3 mg/dL (ref 1.5–2.5)

## 2022-05-30 MED ORDER — ROSUVASTATIN CALCIUM 10 MG PO TABS
ORAL_TABLET | ORAL | 3 refills | Status: DC
  Filled 2022-05-30: qty 90, 90d supply, fill #0
  Filled 2022-11-29: qty 90, 90d supply, fill #1
  Filled 2023-03-12: qty 90, 90d supply, fill #2

## 2022-06-02 IMAGING — CT CT ABD-PELV W/ CM
2 of 6 series · 17 of 46 positions shown, 19 images · IV contrast (omnipaque)
Comparison: 02/22/2012

CLINICAL DATA: Abdominal pain with vomiting

EXAM:
CT ABDOMEN AND PELVIS WITH CONTRAST
TECHNIQUE: Multidetector CT imaging of the abdomen and pelvis was performed
using the standard protocol following bolus administration of
intravenous contrast.
CONTRAST:  100mL OMNIPAQUE IOHEXOL 300 MG/ML  SOLN

[Series 2: axial st · axial · 0.98mm/px · z∈[+776,+1206]mm · 14 of 98 slices shown, 16 images]
[im 6/98  soft-tissue]
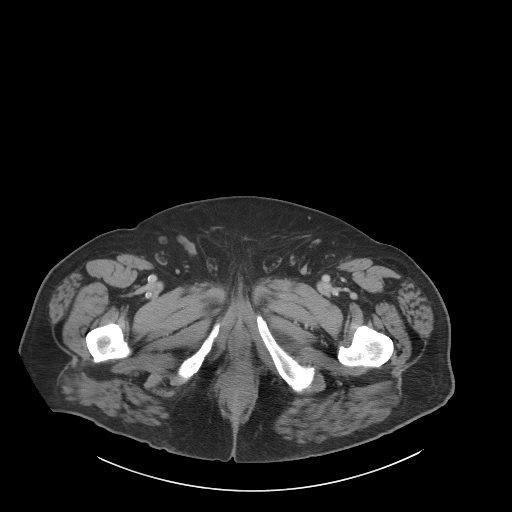
[im 6/98  bone]
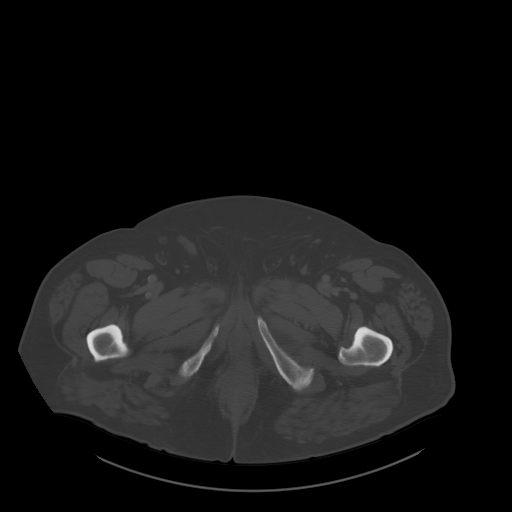
[im 11/98  soft-tissue]
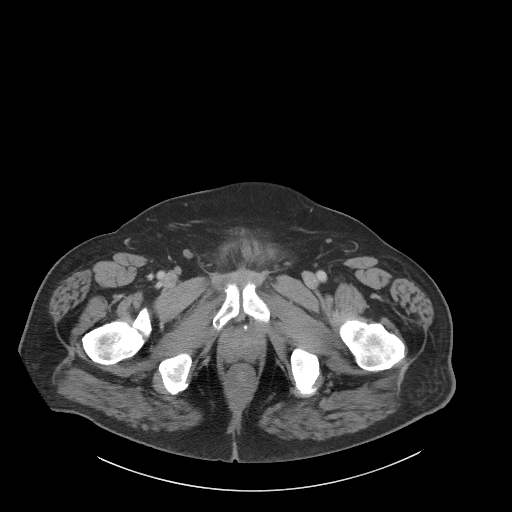
[im 21/98  soft-tissue]
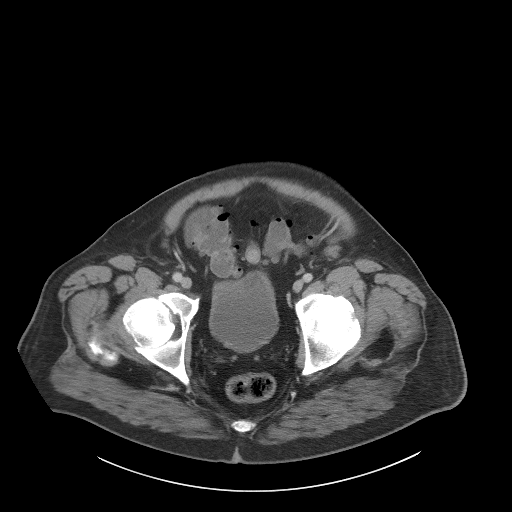
[im 26/98  soft-tissue]
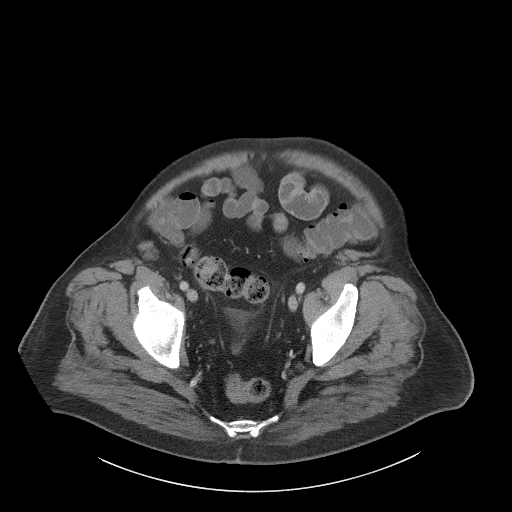
[im 31/98  soft-tissue]
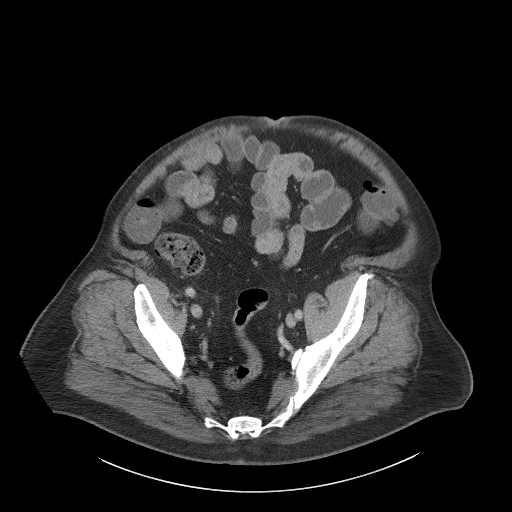
[im 41/98  soft-tissue]
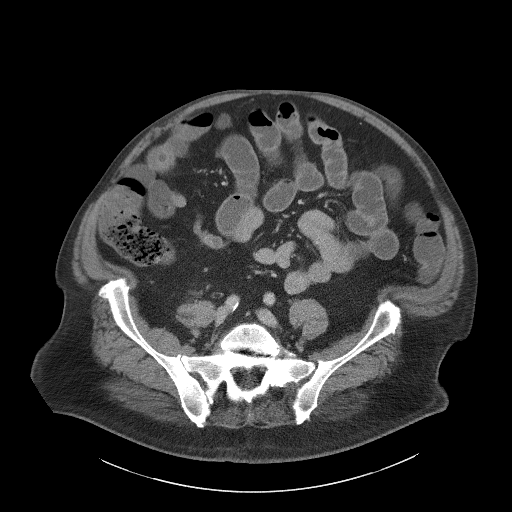
[im 46/98  soft-tissue]
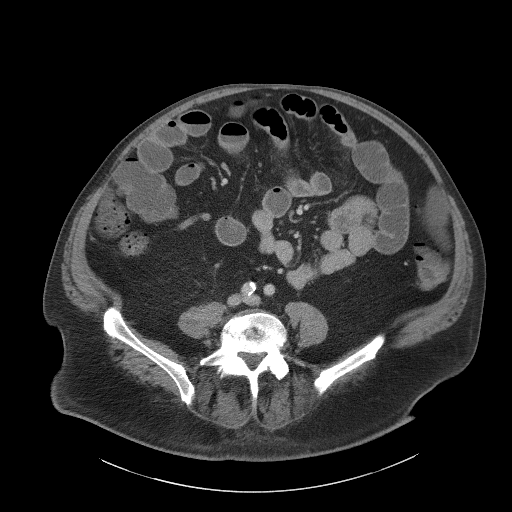
[im 52/98  soft-tissue]
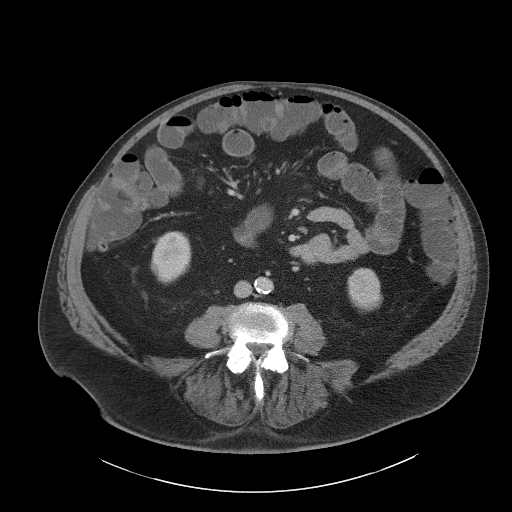
[im 57/98  soft-tissue]
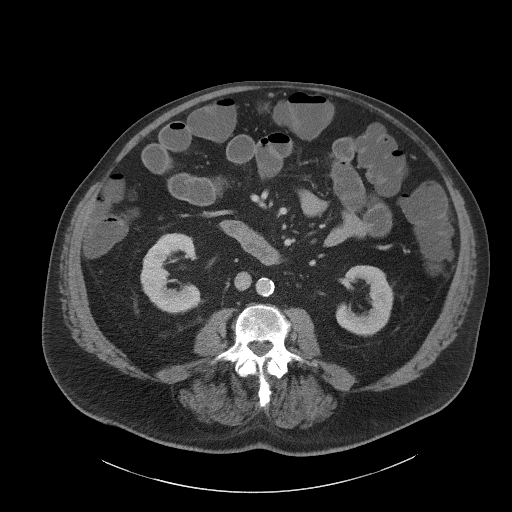
[im 57/98  bone]
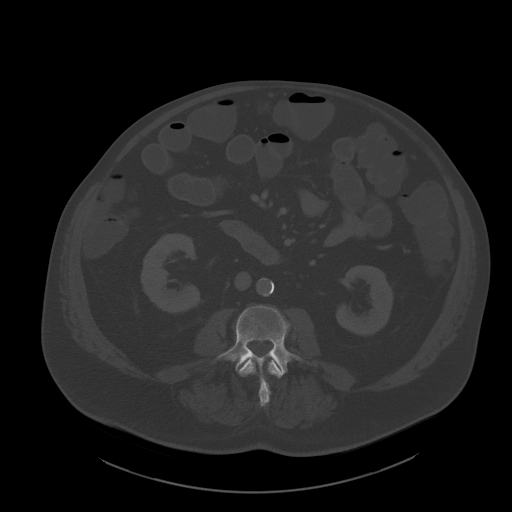
[im 67/98  soft-tissue]
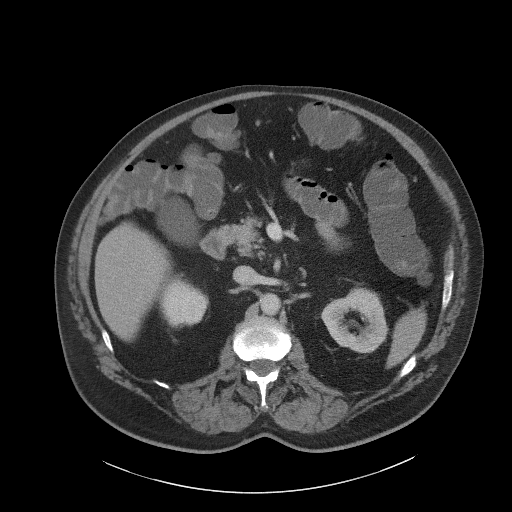
[im 72/98  soft-tissue]
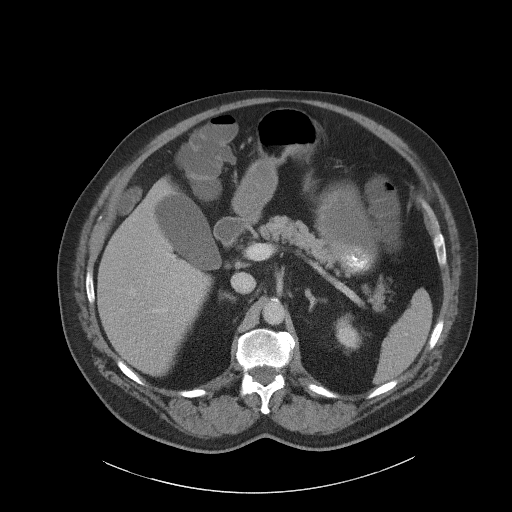
[im 77/98  soft-tissue]
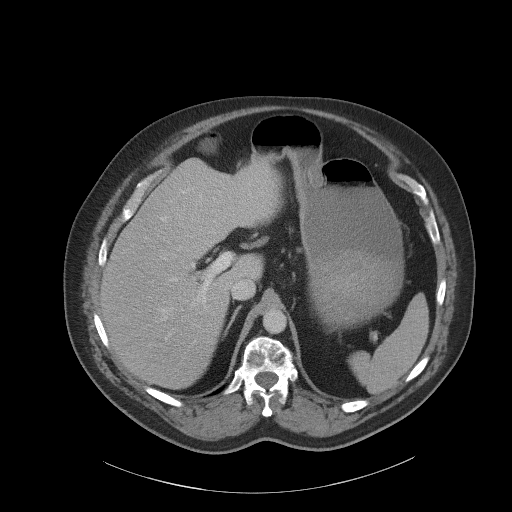
[im 87/98  soft-tissue]
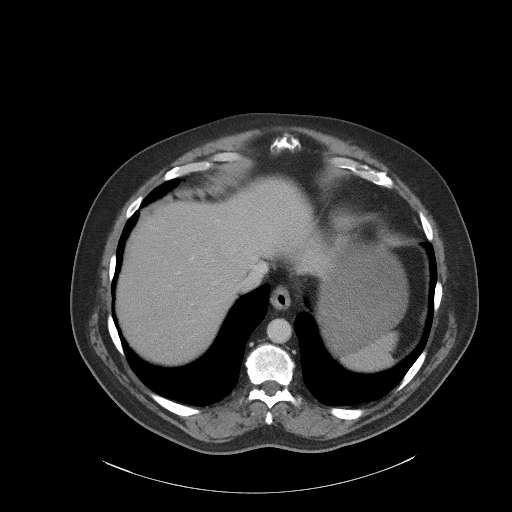
[im 92/98  soft-tissue]
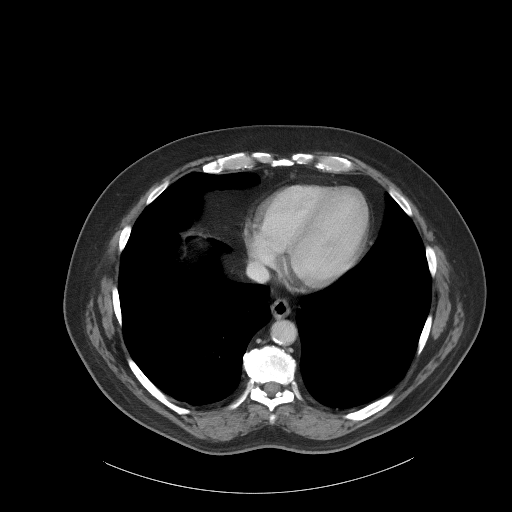

[Series 5: coronal st · coronal · 0.89mm/px · 3 of 123 slices shown]
[im 41/123  soft-tissue]
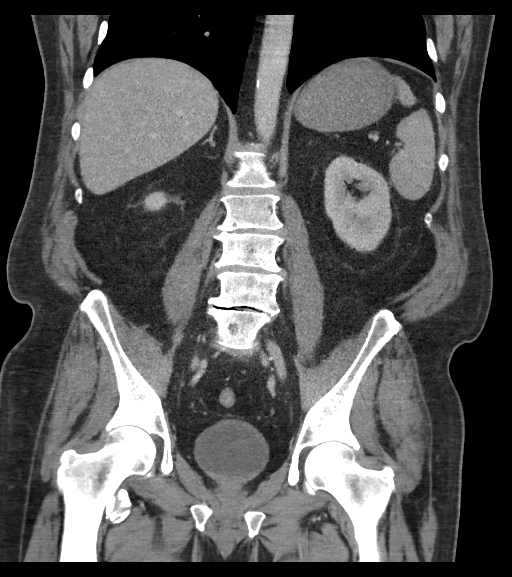
[im 55/123  soft-tissue]
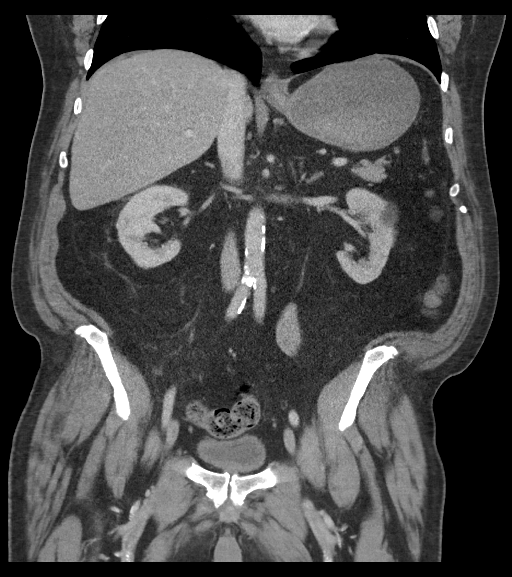
[im 68/123  soft-tissue]
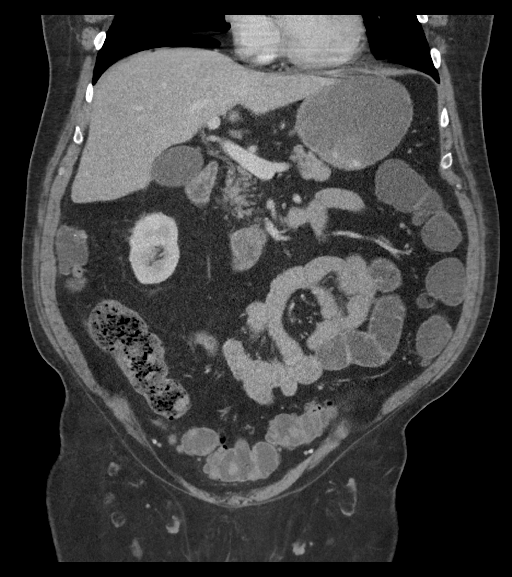

[17 of 46 positions shown; findings below may reference images not displayed]

FINDINGS: Lower chest:  No contributory findings.

Hepatobiliary: No focal liver abnormality.No evidence of biliary
obstruction or stone.

Pancreas: Unremarkable.

Spleen: Unremarkable.

Adrenals/Urinary Tract: Negative adrenals. No hydronephrosis or
stone. Simple left renal cyst unremarkable bladder.

Stomach/Bowel: Intermittent fluid levels in the colon and small
bowel. No transition point. No bowel wall thickening or
appendicitis.

Vascular/Lymphatic: Scattered atheromatous calcification of the
aorta and iliacs. No mass or adenopathy.

Reproductive:No pathologic findings.

Other: No ascites or pneumoperitoneum.

Musculoskeletal: No acute abnormalities. Generalized lumbar spine
degeneration with mild scoliosis.
IMPRESSION: Small bowel and colonic fluid levels suggesting enteritis/diarrheal
illness.

## 2022-06-13 ENCOUNTER — Ambulatory Visit (INDEPENDENT_AMBULATORY_CARE_PROVIDER_SITE_OTHER): Payer: HMO

## 2022-06-13 ENCOUNTER — Other Ambulatory Visit: Payer: Self-pay

## 2022-06-13 DIAGNOSIS — E875 Hyperkalemia: Secondary | ICD-10-CM | POA: Diagnosis not present

## 2022-06-13 DIAGNOSIS — N182 Chronic kidney disease, stage 2 (mild): Secondary | ICD-10-CM

## 2022-06-13 DIAGNOSIS — E1122 Type 2 diabetes mellitus with diabetic chronic kidney disease: Secondary | ICD-10-CM

## 2022-06-13 NOTE — Progress Notes (Signed)
Patient presents today for a Nurse Visit to recheck potassium. He says he is doing good and drinking his water.

## 2022-06-14 ENCOUNTER — Other Ambulatory Visit: Payer: Self-pay | Admitting: Adult Health

## 2022-06-14 ENCOUNTER — Encounter: Payer: Self-pay | Admitting: Adult Health

## 2022-06-14 DIAGNOSIS — N289 Disorder of kidney and ureter, unspecified: Secondary | ICD-10-CM

## 2022-06-14 LAB — BASIC METABOLIC PANEL WITH GFR
BUN/Creatinine Ratio: 19 (calc) (ref 6–22)
BUN: 29 mg/dL — ABNORMAL HIGH (ref 7–25)
CO2: 23 mmol/L (ref 20–32)
Calcium: 9.4 mg/dL (ref 8.6–10.3)
Chloride: 103 mmol/L (ref 98–110)
Creat: 1.53 mg/dL — ABNORMAL HIGH (ref 0.70–1.28)
Glucose, Bld: 126 mg/dL — ABNORMAL HIGH (ref 65–99)
Potassium: 4.8 mmol/L (ref 3.5–5.3)
Sodium: 138 mmol/L (ref 135–146)
eGFR: 47 mL/min/{1.73_m2} — ABNORMAL LOW (ref >=60)

## 2022-07-02 ENCOUNTER — Other Ambulatory Visit (HOSPITAL_COMMUNITY): Payer: Self-pay

## 2022-07-03 ENCOUNTER — Ambulatory Visit (INDEPENDENT_AMBULATORY_CARE_PROVIDER_SITE_OTHER): Payer: HMO

## 2022-07-03 DIAGNOSIS — N289 Disorder of kidney and ureter, unspecified: Secondary | ICD-10-CM

## 2022-07-04 ENCOUNTER — Other Ambulatory Visit: Payer: Self-pay | Admitting: Internal Medicine

## 2022-07-04 DIAGNOSIS — N179 Acute kidney failure, unspecified: Secondary | ICD-10-CM

## 2022-07-04 DIAGNOSIS — E875 Hyperkalemia: Secondary | ICD-10-CM

## 2022-07-04 LAB — BASIC METABOLIC PANEL WITH GFR
BUN/Creatinine Ratio: 17 (calc) (ref 6–22)
BUN: 24 mg/dL (ref 7–25)
CO2: 24 mmol/L (ref 20–32)
Calcium: 10.2 mg/dL (ref 8.6–10.3)
Chloride: 104 mmol/L (ref 98–110)
Creat: 1.41 mg/dL — ABNORMAL HIGH (ref 0.70–1.28)
Glucose, Bld: 99 mg/dL (ref 65–99)
Potassium: 6.3 mmol/L (ref 3.5–5.3)
Sodium: 141 mmol/L (ref 135–146)
eGFR: 52 mL/min/{1.73_m2} — ABNORMAL LOW (ref >=60)

## 2022-07-04 NOTE — Progress Notes (Signed)
<><><><><><><><><><><><><><><><><><><><><><><><><><><><><><><><><> <><><><><><><><><><><><><><><><><><><><><><><><><><><><><><><><><>  -    Potassium = 6.3 mg% is very high  - Please be absolutely sure not taking any "Salt Substitutes"                                                                                   Like " Lite Salt " or  " No Salt "  Also  AVOID  foods high in Potassium as   Lima beans / Kidney beans,   prunes, Raisins,   Avocados, Watermelon,   Sunflower seeds/kernals,   cooked spinach,acorn squash,   crushed tomatoes, Mushrooms,   cooked Salmon and   of course Orange Juice ( & also Avoid Bananas)   <><><><><><><><><><><><><><><><><><><><><><><><><><><><><><><><><>  - Stop Olmesartan  ( Benicar) as it may elevate Potassium  ( And monitor BPs )    - Recommend Lab in morning to check a  " stat " potassium                                                                                                     (  BMET )   <><><><><><><><><><><><><><><><><><><><><><><><><><><><><><><><><> <><><><><><><><><><><><><><><><><><><><><><><><><><><><><><><><><>

## 2022-07-05 ENCOUNTER — Other Ambulatory Visit: Payer: Self-pay

## 2022-07-05 DIAGNOSIS — N179 Acute kidney failure, unspecified: Secondary | ICD-10-CM | POA: Diagnosis not present

## 2022-07-05 DIAGNOSIS — E875 Hyperkalemia: Secondary | ICD-10-CM | POA: Diagnosis not present

## 2022-07-05 LAB — BASIC METABOLIC PANEL WITH GFR
BUN: 19 mg/dL (ref 7–25)
CO2: 25 mmol/L (ref 20–32)
Calcium: 9.9 mg/dL (ref 8.6–10.3)
Chloride: 104 mmol/L (ref 98–110)
Creat: 1.26 mg/dL (ref 0.70–1.28)
Glucose, Bld: 171 mg/dL — ABNORMAL HIGH (ref 65–99)
Potassium: 5.1 mmol/L (ref 3.5–5.3)
Sodium: 137 mmol/L (ref 135–146)
eGFR: 60 mL/min/{1.73_m2} (ref >=60)

## 2022-07-05 NOTE — Progress Notes (Signed)
<><><><><><><><><><><><><><><><><><><><><><><><><><><><><><><><><> <><><><><><><><><><><><><><><><><><><><><><><><><><><><><><><><><>  -    Repeat Potassium returned Normal - , So continue same & may restart   Olmesartan 40 mg at 1/2 tablet at Night (= 20 mg)  &   Monitor BPs  & call if over 140 / 90   - Otherwise see at 9/27 OV

## 2022-08-06 ENCOUNTER — Other Ambulatory Visit (HOSPITAL_COMMUNITY): Payer: Self-pay

## 2022-08-06 ENCOUNTER — Encounter: Payer: Self-pay | Admitting: Nurse Practitioner

## 2022-08-06 DIAGNOSIS — E1122 Type 2 diabetes mellitus with diabetic chronic kidney disease: Secondary | ICD-10-CM

## 2022-08-06 MED ORDER — OZEMPIC (0.25 OR 0.5 MG/DOSE) 2 MG/3ML ~~LOC~~ SOPN
PEN_INJECTOR | SUBCUTANEOUS | 0 refills | Status: DC
  Filled 2022-08-06: qty 3, 42d supply, fill #0

## 2022-08-07 ENCOUNTER — Other Ambulatory Visit (HOSPITAL_COMMUNITY): Payer: Self-pay

## 2022-09-03 ENCOUNTER — Other Ambulatory Visit (HOSPITAL_COMMUNITY): Payer: Self-pay

## 2022-09-03 ENCOUNTER — Other Ambulatory Visit: Payer: Self-pay | Admitting: Nurse Practitioner

## 2022-09-03 DIAGNOSIS — E1122 Type 2 diabetes mellitus with diabetic chronic kidney disease: Secondary | ICD-10-CM

## 2022-09-03 MED ORDER — OZEMPIC (0.25 OR 0.5 MG/DOSE) 2 MG/3ML ~~LOC~~ SOPN
PEN_INJECTOR | SUBCUTANEOUS | 0 refills | Status: DC
  Filled 2022-09-03: qty 3, 42d supply, fill #0
  Filled 2022-09-04: qty 3, 28d supply, fill #0

## 2022-09-04 ENCOUNTER — Other Ambulatory Visit (HOSPITAL_COMMUNITY): Payer: Self-pay

## 2022-09-04 NOTE — Progress Notes (Unsigned)
Future Appointments  Date Time Provider Department  09/05/2022               6 mo ov  11:30 AM Unk Pinto, MD [facility]  02/13/2023                 cpe   3:00 PM Darrol Jump, NP [facility]  05/30/2023               wellness 11:00 AM Alycia Rossetti, NP [facility]    History of Present Illness:       This very nice 76 y.o. MWM presents for 6  month follow up with HTN, HLD, T2_NIDDM and Vitamin D Deficiency. CT scan in 2018 revealed Aortic Atherosclerosis. Patient is on CPAP for OSA with improved restorative sleep        Patient is treated for HTN  (1995) & BP has been controlled at home. Today's BP is at goal - 138/84. Patient has had no complaints of any cardiac type chest pain, palpitations, dyspnea / orthopnea / PND, dizziness, claudication, or dependent edema.       Hyperlipidemia is controlled with diet & rosuvastatin . Patient denies myalgias or other med SE's. 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   Last A1c was not at goal :  Lab Results  Component Value Date   HGBA1C 6.7 (H) 05/29/2022                                                     Further, the patient also has history of Vitamin D Deficiency  ("29" /2019) and supplements vitamin D without any suspected side-effects. Last vitamin D was at goal :  Lab Results  Component Value Date   VD25OH 79 02/08/2022       Current Outpatient Medications  Medication Instructions   APPLE CIDER VINEGAR  Oral, Daily   VITAMIN C ) Oral, 1 tablet daily    aspirin 81 MG EC tablet Take 1 tablet daily   cetirizine (ZYRTEC) 10 mg, Oral, Daily   CHOLECALCIFEROL  10,000 Units Daily   B-12) 500 MCG SL 1  tablet, Sublingual, Daily   gabapentin (NEURONTIN) 100 MG capsule Take 1 capsule  3 times daily for chronic pain   ipratropium (ATROVENT) 0.06 % nasal spray 1-2 sprays each  nostril  2-3 x/day as needed    Magnesium 400 MG CAPS 1 capsule, Oral, 2 times daily   metFORMIN -XR 500 MG 24 hr tablet Take 2 tablets  twice daily with a meal .   olmesartan (BENICAR) 40 mg, Oral, Daily   Omega-3 Fatty Acids (FISH OIL PO) Oral, 2 times daily   rosuvastatin (CRESTOR) 10 MG tablet Take 1 tablet nightly    Semaglutide,0.25 or 0.'5MG'$ /DOS, (OZEMPIC, 0.25 OR 0.5 MG/DOSE,) 2 MG/3ML SOPN Inject 0.25 mg into skin once weekly; if doing well increase in 4 weeks to 0.5 mg weekly.   Zinc 50 MG TABS Oral,  Daily     Allergies  Allergen Reactions   No Known Allergies      PMHx:   Past Medical History:  Diagnosis Date   Arthritis    Asthma    childhood   Cancer ([place])    basal cell face and back   Diabetes mellitus    Diabetes mellitus type 2 with neurological manifestations ([place]) 10/04/2014   Dyspnea    Erectile dysfunction    GERD (gastroesophageal reflux disease)    Headache    Heart murmur    History of kidney stones    Hypercholesteremia    Hyperlipemia    Hypertension    Kidney stone    Low back pain    Obesity    Rotator cuff dysfunction    partial tear , left shoulder   Sleep apnea      Immunization History  Administered Date(s) Administered   PFIZER Covid-19 Tri-Sucrose Vacc  07/19/2021   PFIZER SARS-COV-2 Vacci  01/01/2020, 01/22/2020, 09/07/2020   Pneumococcal-23 05/05/2009, 03/03/2013   Td 05/05/2009   Tdap 06/21/2015   Zoster, Live 03/03/2013     Past Surgical History:  Procedure Laterality Date   COLONOSCOPY     KNEE ARTHROSCOPY     PILONIDAL CYST EXCISION     from base of spine   RT TOTAL KNEE ARTHROPLASTY;  Gaynelle Arabian, MD;  10/21/2017     FHx:    Reviewed / unchanged  SHx:    Reviewed / unchanged   Systems Review:  Constitutional: Denies fever, chills,  wt changes, headaches, insomnia, fatigue, night sweats, change in appetite. Eyes: Denies redness, blurred vision, diplopia, discharge, itchy, watery eyes.  ENT: Denies discharge, congestion, post nasal drip, epistaxis, sore throat, earache, hearing loss, dental pain, tinnitus, vertigo, sinus pain, snoring.  CV: Denies chest pain, palpitations, irregular heartbeat, syncope, dyspnea, diaphoresis, orthopnea, PND, claudication or edema. Respiratory: denies cough, dyspnea, DOE, pleurisy, hoarseness, laryngitis, wheezing.  Gastrointestinal: Denies dysphagia, odynophagia, heartburn, reflux, water brash, abdominal pain or cramps, nausea, vomiting, bloating, diarrhea, constipation, hematemesis, melena, hematochezia  or hemorrhoids. Genitourinary: Denies dysuria, frequency, urgency, nocturia, hesitancy, discharge, hematuria or flank pain. Musculoskeletal: Denies arthralgias, myalgias, stiffness, jt. swelling, pain, limping or strain/sprain.  Skin: Denies pruritus, rash, hives, warts, acne, eczema or change in skin lesion(s). Neuro: No weakness, tremor, incoordination, spasms, paresthesia or pain. Psychiatric: Denies confusion, memory loss or sensory loss. Endo: Denies change in weight, skin or hair change.  Heme/Lymph: No excessive bleeding, bruising or enlarged lymph nodes.  Physical Exam  There were no vitals taken for this visit.  Appears over nourished, well groomed  and in no distress.  Eyes: PERRLA, EOMs, conjunctiva no swelling or erythema. Sinuses: No frontal/maxillary tenderness ENT/Mouth: EAC's clear, TM's nl w/o erythema, bulging. Nares clear w/o erythema, swelling, exudates. Oropharynx clear without erythema or exudates. Oral hygiene is good. Tongue normal, non obstructing. Hearing intact.  Neck: Supple. Thyroid not palpable. Car 2+/2+ without bruits, nodes or JVD. Chest: Respirations nl with BS clear & equal w/o rales, rhonchi, wheezing or stridor.  Cor: Heart sounds normal w/ regular  rate and rhythm without sig. murmurs, gallops, clicks or rubs. Peripheral pulses normal and equal  without edema.  Abdomen: Soft & bowel sounds normal. Non-tender w/o guarding, rebound, hernias, masses or organomegaly.  Lymphatics: Unremarkable.  Musculoskeletal: Full ROM all peripheral extremities, joint stability, 5/5 strength and normal gait.  Skin: Warm, dry without exposed rashes, lesions or ecchymosis apparent.  Neuro: Cranial nerves intact, reflexes equal bilaterally. Sensory-motor testing  grossly intact. Tendon reflexes grossly intact.  Pysch: Alert & oriented x 3.  Insight and judgement nl & appropriate. No ideations.  Assessment and Plan:  1. Severe obesity with body mass index (BMI) of 35.0 to 39.9 with comorbidity ([place])   2. Essential hypertension  - Continue medication, monitor blood pressure at home.  - Continue DASH diet.  Reminder to go to the ER if any CP,  SOB, nausea, dizziness, severe HA, changes vision/speech.   - CBC with Differential/Platelet - COMPLETE METABOLIC PANEL WITH GFR - Magnesium - TSH  3. Hyperlipidemia associated with type 2 diabetes mellitus ([place])  - Continue diet/meds, exercise,& lifestyle modifications.  - Continue monitor periodic cholesterol/liver & renal functions    - Lipid panel - TSH  4. Type 2 diabetes mellitus with stage 2 chronic kidney  disease, without long-term current use of insulin ([facility])  - Continue diet, exercise  - Lifestyle modifications.  - Monitor appropriate labs   - COMPLETE METABOLIC PANEL WITH GFR - Hemoglobin A1c - Insulin, random  5. Vitamin D deficiency  - Continue supplementation.  - VITAMIN D 25 Hydroxy   6. Aortic atherosclerosis ([place]) by CT scan 2018  - Lipid panel  7. OSA on CPAP   8. Medication management  - CBC with Differential/Platelet - COMPLETE METABOLIC PANEL WITH GFR - Magnesium - Lipid panel - TSH - Hemoglobin A1c - Insulin, random - VITAMIN D 25 Hydroxy (Vit-D Deficiency,  Fractures)  9. Vasomotor rhinitis  - ipratropium (ATROVENT) 0.06 % nasal spray;  Use 1 to 2 sprays each nostril 2 to 3 x /day as needed for Watery Rhinorrhea   Dispense: 45 mL; Refill: 3         Discussed  regular exercise, BP monitoring, weight control to achieve/maintain BMI less than 25 and discussed med and SE's. Recommended labs to assess and monitor clinical status with further disposition pending results of labs.  I discussed the assessment and treatment plan with the patient. The patient was provided an opportunity to ask questions and all were answered. The patient agreed with the plan and demonstrated an understanding of the instructions.  I provided over 30 minutes of exam, counseling, chart review and  complex critical decision making.        The patient was advised to call back or seek an in-person evaluation if the symptoms worsen or if the condition fails to improve as anticipated.   Kirtland Bouchard, MD

## 2022-09-04 NOTE — Patient Instructions (Signed)

## 2022-09-05 ENCOUNTER — Ambulatory Visit (INDEPENDENT_AMBULATORY_CARE_PROVIDER_SITE_OTHER): Payer: HMO | Admitting: Internal Medicine

## 2022-09-05 ENCOUNTER — Encounter: Payer: Self-pay | Admitting: Internal Medicine

## 2022-09-05 DIAGNOSIS — E559 Vitamin D deficiency, unspecified: Secondary | ICD-10-CM

## 2022-09-05 DIAGNOSIS — Z79899 Other long term (current) drug therapy: Secondary | ICD-10-CM

## 2022-09-05 DIAGNOSIS — G4733 Obstructive sleep apnea (adult) (pediatric): Secondary | ICD-10-CM | POA: Diagnosis not present

## 2022-09-05 DIAGNOSIS — N182 Chronic kidney disease, stage 2 (mild): Secondary | ICD-10-CM

## 2022-09-05 DIAGNOSIS — Z9989 Dependence on other enabling machines and devices: Secondary | ICD-10-CM

## 2022-09-05 DIAGNOSIS — E1122 Type 2 diabetes mellitus with diabetic chronic kidney disease: Secondary | ICD-10-CM | POA: Diagnosis not present

## 2022-09-05 DIAGNOSIS — I10 Essential (primary) hypertension: Secondary | ICD-10-CM | POA: Diagnosis not present

## 2022-09-05 DIAGNOSIS — E1169 Type 2 diabetes mellitus with other specified complication: Secondary | ICD-10-CM

## 2022-09-05 DIAGNOSIS — E785 Hyperlipidemia, unspecified: Secondary | ICD-10-CM | POA: Diagnosis not present

## 2022-09-05 DIAGNOSIS — I700 Atherosclerosis of aorta: Secondary | ICD-10-CM

## 2022-09-05 MED ORDER — OZEMPIC (1 MG/DOSE) 4 MG/3ML ~~LOC~~ SOPN
1.0000 mg | PEN_INJECTOR | SUBCUTANEOUS | 0 refills | Status: DC
  Filled 2022-09-05: qty 3, 28d supply, fill #0

## 2022-09-05 NOTE — Progress Notes (Incomplete)
Future Appointments  Date Time Provider Department  09/05/2022               6 mo ov  11:30 AM Unk Pinto, MD [facility]  02/13/2023                 cpe   3:00 PM Darrol Jump, NP [facility]  05/30/2023               wellness 11:00 AM Alycia Rossetti, NP [facility]    History of Present Illness:       This very nice 76 y.o. MWM presents for 6  month follow up with HTN, HLD, T2_NIDDM and Vitamin D Deficiency. CT scan in 2018 revealed Aortic Atherosclerosis. Patient is on CPAP for OSA with improved restorative sleep        Patient is treated for HTN  (1995) & BP has been controlled at home. Today's BP is at goal - 138/84. Patient has had no complaints of any cardiac type chest pain, palpitations, dyspnea / orthopnea / PND, dizziness, claudication, or dependent edema.       Hyperlipidemia is controlled with diet & rosuvastatin . Patient denies myalgias or other med SE's. 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   Last A1c was not at goal :  Lab Results  Component Value Date   HGBA1C 6.7 (H) 05/29/2022                                                     Further, the patient also has history of Vitamin D Deficiency  ("29" /2019) and supplements vitamin D without any suspected side-effects. Last vitamin D was at goal :  Lab Results  Component Value Date   VD25OH 79 02/08/2022       Current Outpatient Medications  Medication Instructions  . APPLE CIDER VINEGAR  Oral, Daily  . VITAMIN C ) Oral, 1 tablet daily   . aspirin 81 MG EC tablet Take 1 tablet daily  . cetirizine (ZYRTEC) 10 mg, Oral, Daily  . CHOLECALCIFEROL  10,000 Units Daily  . B-12) 500 MCG  SL 1 tablet, Sublingual, Daily  . gabapentin (NEURONTIN) 100 MG capsule Take 1 capsule  3 times daily for chronic pain  . ipratropium (ATROVENT) 0.06 % nasal spray 1-2 sprays each  nostril  2-3 x/day as needed   . Magnesium 400 MG CAPS 1 capsule, Oral, 2 times daily  . metFORMIN -XR 500 MG 24 hr tablet Take 2 tablets  twice daily with a meal .  . olmesartan (BENICAR) 40 mg, Oral, Daily  . Omega-3 Fatty Acids (FISH OIL PO) Oral, 2 times daily  . rosuvastatin (CRESTOR) 10 MG tablet Take 1 tablet nightly   . Semaglutide,0.25 or 0.'5MG'$ /DOS, (OZEMPIC, 0.25 OR 0.5 MG/DOSE,) 2 MG/3ML SOPN Inject 0.25 mg into skin once weekly; if doing well increase in 4 weeks to 0.5 mg weekly.  . Zinc 50 MG TABS Oral,  Daily     Allergies  Allergen Reactions  . No Known Allergies      PMHx:   Past Medical History:  Diagnosis Date  . Arthritis   . Asthma    childhood  . Cancer ([place])    basal cell face and back  . Diabetes mellitus   . Diabetes mellitus type 2 with neurological manifestations ([place]) 10/04/2014  . Dyspnea   . Erectile dysfunction   . GERD (gastroesophageal reflux disease)   . Headache   . Heart murmur   . History of kidney stones   . Hypercholesteremia   . Hyperlipemia   . Hypertension   . Kidney stone   . Low back pain   . Obesity   . Rotator cuff dysfunction    partial tear , left shoulder  . Sleep apnea      Immunization History  Administered Date(s) Administered  . PFIZER Covid-19 Tri-Sucrose Vacc  07/19/2021  . PFIZER SARS-COV-2 Vacci  01/01/2020, 01/22/2020, 09/07/2020  . Pneumococcal-23 05/05/2009, 03/03/2013  . Td 05/05/2009  . Tdap 06/21/2015  . Zoster, Live 03/03/2013     Past Surgical History:  Procedure Laterality Date  . COLONOSCOPY    . KNEE ARTHROSCOPY    . PILONIDAL CYST EXCISION     from base of spine   RT TOTAL KNEE ARTHROPLASTY;  Gaynelle Arabian, MD;  10/21/2017     FHx:    Reviewed / unchanged  SHx:    Reviewed / unchanged   Systems  Review:  Constitutional: Denies fever, chills, wt changes, headaches, insomnia, fatigue, night sweats, change in appetite. Eyes: Denies redness, blurred vision, diplopia, discharge, itchy, watery eyes.  ENT: Denies discharge, congestion, post nasal drip, epistaxis, sore throat, earache, hearing loss, dental pain, tinnitus, vertigo, sinus pain, snoring.  CV: Denies chest pain, palpitations, irregular heartbeat, syncope, dyspnea, diaphoresis, orthopnea, PND, claudication or edema. Respiratory: denies cough, dyspnea, DOE, pleurisy, hoarseness, laryngitis, wheezing.  Gastrointestinal: Denies dysphagia, odynophagia, heartburn, reflux, water brash, abdominal pain or cramps, nausea, vomiting, bloating, diarrhea, constipation, hematemesis, melena, hematochezia  or hemorrhoids. Genitourinary: Denies dysuria, frequency, urgency, nocturia, hesitancy, discharge, hematuria or flank pain. Musculoskeletal: Denies arthralgias, myalgias, stiffness, jt. swelling, pain, limping or strain/sprain.  Skin: Denies pruritus, rash, hives, warts, acne, eczema or change in skin lesion(s). Neuro: No weakness, tremor, incoordination, spasms, paresthesia or pain. Psychiatric: Denies confusion, memory loss or sensory loss. Endo: Denies change in weight, skin or hair change.  Heme/Lymph: No excessive bleeding, bruising or enlarged lymph nodes.  Physical Exam  There were no vitals taken for this visit.  Appears over nourished, well groomed  and in no distress.  Eyes: PERRLA, EOMs, conjunctiva no swelling or erythema. Sinuses: No frontal/maxillary tenderness ENT/Mouth: EAC's clear, TM's nl w/o erythema, bulging. Nares clear w/o erythema, swelling, exudates. Oropharynx clear without erythema or exudates. Oral hygiene is good. Tongue normal, non obstructing. Hearing intact.  Neck: Supple. Thyroid not palpable. Car 2+/2+ without bruits, nodes or JVD. Chest: Respirations nl with BS clear & equal w/o rales, rhonchi, wheezing or  stridor.  Cor: Heart sounds normal w/ regular rate and rhythm without sig. murmurs, gallops, clicks or rubs. Peripheral pulses normal and equal  without edema.  Abdomen: Soft & bowel sounds normal. Non-tender w/o guarding, rebound, hernias, masses or organomegaly.  Lymphatics: Unremarkable.  Musculoskeletal: Full ROM all peripheral extremities, joint stability, 5/5 strength and normal gait.  Skin: Warm, dry without exposed rashes, lesions or ecchymosis apparent.  Neuro: Cranial nerves intact, reflexes equal bilaterally. Sensory-motor testing  grossly intact. Tendon reflexes grossly intact.  Pysch: Alert & oriented x 3.  Insight and judgement nl & appropriate. No ideations.  Assessment and Plan:  1. Severe obesity with body mass index (BMI) of 35.0 to 39.9 with comorbidity ([place])   2. Essential hypertension  - Continue medication, monitor blood pressure at home.  - Continue DASH diet.  Reminder to go to the ER if any CP,  SOB, nausea, dizziness, severe HA, changes vision/speech.    - CBC with Differential/Platelet - COMPLETE METABOLIC PANEL WITH GFR - Magnesium - TSH  3. Hyperlipidemia associated with type 2 diabetes mellitus ([place])  - Continue diet/meds, exercise,& lifestyle modifications.  - Continue monitor periodic cholesterol/liver & renal functions     - Lipid panel - TSH  4. Type 2 diabetes mellitus with stage 2 chronic kidney  disease, without long-term current use of insulin (H[facility])  - Continue diet, exercise  - Lifestyle modifications.  - Monitor appropriate labs   - Hemoglobin A1c - Insulin, random  5. Vitamin D deficiency  - Continue supplementation.   VITAMIN D 25 Hydroxy   6. Aortic atherosclerosis ([place]) by CT scan 2018  - Lipid panel  7. OSA on CPAP   8. Medication management  - CBC with Differential/Platelet - COMPLETE METABOLIC PANEL WITH GFR - Magnesium - Lipid panel - TSH - Hemoglobin A1c - Insulin, random - VITAMIN D 25  Hydroxy         Discussed  regular exercise, BP monitoring, weight control to achieve/maintain BMI less than 25 and discussed med and SE's. Recommended labs to assess and monitor clinical status with further disposition pending results of labs.  I discussed the assessment and treatment plan with the patient. The patient was provided an opportunity to ask questions and all were answered. The patient agreed with the plan and demonstrated an understanding of the instructions.  I provided over 30 minutes of exam, counseling, chart review and  complex critical decision making.        The patient was advised to call back or seek an in-person evaluation if the symptoms worsen or if the condition fails to improve as anticipated.   Kirtland Bouchard, MD

## 2022-09-06 ENCOUNTER — Other Ambulatory Visit (HOSPITAL_COMMUNITY): Payer: Self-pay

## 2022-09-06 LAB — CBC WITH DIFFERENTIAL/PLATELET
Absolute Monocytes: 575 cells/uL (ref 200–950)
Basophils Absolute: 43 cells/uL (ref 0–200)
Basophils Relative: 0.6 %
Eosinophils Absolute: 149 cells/uL (ref 15–500)
Eosinophils Relative: 2.1 %
HCT: 41.7 % (ref 38.5–50.0)
Hemoglobin: 14.6 g/dL (ref 13.2–17.1)
Lymphs Abs: 2457 cells/uL (ref 850–3900)
MCH: 30.9 pg (ref 27.0–33.0)
MCHC: 35 g/dL (ref 32.0–36.0)
MCV: 88.2 fL (ref 80.0–100.0)
MPV: 10 fL (ref 7.5–12.5)
Monocytes Relative: 8.1 %
Neutro Abs: 3877 cells/uL (ref 1500–7800)
Neutrophils Relative %: 54.6 %
Platelets: 286 10*3/uL (ref 140–400)
RBC: 4.73 10*6/uL (ref 4.20–5.80)
RDW: 12.3 % (ref 11.0–15.0)
Total Lymphocyte: 34.6 %
WBC: 7.1 10*3/uL (ref 3.8–10.8)

## 2022-09-06 LAB — LIPID PANEL
Cholesterol: 104 mg/dL (ref <200)
HDL: 32 mg/dL — ABNORMAL LOW (ref >=40)
LDL Cholesterol (Calc): 49 mg/dL (calc)
Non-HDL Cholesterol (Calc): 72 mg/dL (calc) (ref <130)
Total CHOL/HDL Ratio: 3.3 (calc) (ref <5.0)
Triglycerides: 155 mg/dL — ABNORMAL HIGH (ref <150)

## 2022-09-06 LAB — MAGNESIUM: Magnesium: 2.1 mg/dL (ref 1.5–2.5)

## 2022-09-06 LAB — COMPLETE METABOLIC PANEL WITH GFR
AG Ratio: 1.6 (calc) (ref 1.0–2.5)
ALT: 23 U/L (ref 9–46)
AST: 17 U/L (ref 10–35)
Albumin: 4.2 g/dL (ref 3.6–5.1)
Alkaline phosphatase (APISO): 38 U/L (ref 35–144)
BUN/Creatinine Ratio: 13 (calc) (ref 6–22)
BUN: 17 mg/dL (ref 7–25)
CO2: 22 mmol/L (ref 20–32)
Calcium: 9.3 mg/dL (ref 8.6–10.3)
Chloride: 106 mmol/L (ref 98–110)
Creat: 1.33 mg/dL — ABNORMAL HIGH (ref 0.70–1.28)
Globulin: 2.6 g/dL (calc) (ref 1.9–3.7)
Glucose, Bld: 119 mg/dL — ABNORMAL HIGH (ref 65–99)
Potassium: 5 mmol/L (ref 3.5–5.3)
Sodium: 138 mmol/L (ref 135–146)
Total Bilirubin: 0.4 mg/dL (ref 0.2–1.2)
Total Protein: 6.8 g/dL (ref 6.1–8.1)
eGFR: 56 mL/min/{1.73_m2} — ABNORMAL LOW (ref >=60)

## 2022-09-06 LAB — TSH: TSH: 2 mIU/L (ref 0.40–4.50)

## 2022-09-06 LAB — VITAMIN D 25 HYDROXY (VIT D DEFICIENCY, FRACTURES): Vit D, 25-Hydroxy: 89 ng/mL (ref 30–100)

## 2022-09-06 LAB — HEMOGLOBIN A1C
Hgb A1c MFr Bld: 6.6 % of total Hgb — ABNORMAL HIGH (ref <5.7)
Mean Plasma Glucose: 143 mg/dL
eAG (mmol/L): 7.9 mmol/L

## 2022-09-06 LAB — INSULIN, RANDOM: Insulin: 31.6 u[IU]/mL — ABNORMAL HIGH

## 2022-09-07 ENCOUNTER — Other Ambulatory Visit (HOSPITAL_COMMUNITY): Payer: Self-pay

## 2022-09-10 ENCOUNTER — Other Ambulatory Visit: Payer: Self-pay | Admitting: Internal Medicine

## 2022-09-10 ENCOUNTER — Other Ambulatory Visit (HOSPITAL_COMMUNITY): Payer: Self-pay

## 2022-09-10 ENCOUNTER — Other Ambulatory Visit (HOSPITAL_BASED_OUTPATIENT_CLINIC_OR_DEPARTMENT_OTHER): Payer: Self-pay

## 2022-09-10 MED ORDER — OZEMPIC (2 MG/DOSE) 8 MG/3ML ~~LOC~~ SOPN
PEN_INJECTOR | SUBCUTANEOUS | 3 refills | Status: DC
  Filled 2022-09-10: qty 3, 28d supply, fill #0

## 2022-09-10 MED ORDER — OZEMPIC (1 MG/DOSE) 4 MG/3ML ~~LOC~~ SOPN
PEN_INJECTOR | SUBCUTANEOUS | 0 refills | Status: DC
  Filled 2022-09-10: qty 9, fill #0

## 2022-09-10 MED ORDER — OZEMPIC (1 MG/DOSE) 4 MG/3ML ~~LOC~~ SOPN
PEN_INJECTOR | SUBCUTANEOUS | 0 refills | Status: DC
  Filled 2022-09-10: qty 9, 84d supply, fill #0

## 2022-09-11 ENCOUNTER — Other Ambulatory Visit (HOSPITAL_BASED_OUTPATIENT_CLINIC_OR_DEPARTMENT_OTHER): Payer: Self-pay

## 2022-09-14 ENCOUNTER — Other Ambulatory Visit (HOSPITAL_COMMUNITY): Payer: Self-pay

## 2022-10-30 ENCOUNTER — Other Ambulatory Visit (HOSPITAL_COMMUNITY): Payer: Self-pay

## 2022-11-05 ENCOUNTER — Other Ambulatory Visit (HOSPITAL_COMMUNITY): Payer: Self-pay

## 2022-11-05 ENCOUNTER — Other Ambulatory Visit (HOSPITAL_BASED_OUTPATIENT_CLINIC_OR_DEPARTMENT_OTHER): Payer: Self-pay

## 2022-11-05 MED ORDER — AREXVY 120 MCG/0.5ML IM SUSR
INTRAMUSCULAR | 0 refills | Status: DC
  Filled 2022-11-05: qty 0.5, 1d supply, fill #0

## 2022-11-29 ENCOUNTER — Other Ambulatory Visit (HOSPITAL_BASED_OUTPATIENT_CLINIC_OR_DEPARTMENT_OTHER): Payer: Self-pay

## 2022-11-29 ENCOUNTER — Other Ambulatory Visit (HOSPITAL_COMMUNITY): Payer: Self-pay

## 2022-11-29 ENCOUNTER — Other Ambulatory Visit: Payer: Self-pay | Admitting: Internal Medicine

## 2022-11-29 MED ORDER — OZEMPIC (1 MG/DOSE) 4 MG/3ML ~~LOC~~ SOPN
1.0000 mg | PEN_INJECTOR | SUBCUTANEOUS | 0 refills | Status: DC
  Filled 2022-11-29 – 2022-12-11 (×2): qty 9, 84d supply, fill #0

## 2022-11-30 ENCOUNTER — Other Ambulatory Visit (HOSPITAL_BASED_OUTPATIENT_CLINIC_OR_DEPARTMENT_OTHER): Payer: Self-pay

## 2022-12-05 ENCOUNTER — Encounter: Payer: Self-pay | Admitting: Nurse Practitioner

## 2022-12-05 ENCOUNTER — Ambulatory Visit (INDEPENDENT_AMBULATORY_CARE_PROVIDER_SITE_OTHER): Payer: HMO | Admitting: Nurse Practitioner

## 2022-12-05 VITALS — BP 140/68 | HR 75 | Temp 97.3°F | Ht 71.0 in | Wt 233.0 lb

## 2022-12-05 DIAGNOSIS — E785 Hyperlipidemia, unspecified: Secondary | ICD-10-CM

## 2022-12-05 DIAGNOSIS — M47816 Spondylosis without myelopathy or radiculopathy, lumbar region: Secondary | ICD-10-CM

## 2022-12-05 DIAGNOSIS — M179 Osteoarthritis of knee, unspecified: Secondary | ICD-10-CM

## 2022-12-05 DIAGNOSIS — N182 Chronic kidney disease, stage 2 (mild): Secondary | ICD-10-CM

## 2022-12-05 DIAGNOSIS — E1169 Type 2 diabetes mellitus with other specified complication: Secondary | ICD-10-CM

## 2022-12-05 DIAGNOSIS — Z85828 Personal history of other malignant neoplasm of skin: Secondary | ICD-10-CM

## 2022-12-05 DIAGNOSIS — G4733 Obstructive sleep apnea (adult) (pediatric): Secondary | ICD-10-CM

## 2022-12-05 DIAGNOSIS — E1122 Type 2 diabetes mellitus with diabetic chronic kidney disease: Secondary | ICD-10-CM | POA: Diagnosis not present

## 2022-12-05 DIAGNOSIS — E1141 Type 2 diabetes mellitus with diabetic mononeuropathy: Secondary | ICD-10-CM

## 2022-12-05 DIAGNOSIS — Z79899 Other long term (current) drug therapy: Secondary | ICD-10-CM

## 2022-12-05 DIAGNOSIS — E113299 Type 2 diabetes mellitus with mild nonproliferative diabetic retinopathy without macular edema, unspecified eye: Secondary | ICD-10-CM

## 2022-12-05 DIAGNOSIS — I10 Essential (primary) hypertension: Secondary | ICD-10-CM | POA: Diagnosis not present

## 2022-12-05 NOTE — Progress Notes (Signed)
3 Month Follow Up  Assessment:    Benign essential HTN Discussed DASH (Dietary Approaches to Stop Hypertension) DASH diet is lower in sodium than a typical American diet. Cut back on foods that are high in saturated fat, cholesterol, and trans fats. Eat more whole-grain foods, fish, poultry, and nuts Remain active and exercise as tolerated daily.  Monitor BP at home-Call if greater than 130/80.  Check CMP/CBC   Type 2 diabetes mellitus with stage 2 chronic kidney disease, without long-term current use of insulin [facility]) Education: Reviewed 'ABCs' of diabetes management  Discussed goals to be met and/or maintained include A1C (<7) Blood pressure (<130/80) Cholesterol (LDL <70) Continue Eye Exam yearly  Continue Dental Exam Q6 mo Discussed dietary recommendations Discussed Physical Activity recommendations Check A1C   Hyperlipidemia associated with type 2 diabetes mellitus ([place]) Discussed lifestyle modifications. Recommended diet heavy in fruits and veggies, omega 3's. Decrease consumption of animal meats, cheeses, and dairy products. Remain active and exercise as tolerated. Continue to monitor. Check lipids/TSH   CKD stage 2 due to type 2 diabetes mellitus ([place]) Discussed how what you eat and drink can aide in kidney protection. Stay well hydrated. Avoid high salt foods. Avoid NSAIDS. Keep BP and BG well controlled.   Take medications as prescribed. Remain active and exercise as tolerated daily. Maintain weight.  Continue to monitor. Check CMP/GFR/Microablumin  Diabetic mononeuropathy associated with type 2 diabetes mellitus ([place]) Continue to monitor  Diabetc retinopathy [facility]) Ophthalmology follows;  Now on Ozempic. Weight loss improved.  Osteoarthritis of knee, unspecified laterality, unspecified osteoarthritis type Celebrex, tylenol PRN; establishedd with ortho  Continue weight loss  Lumbar spondylosis Est with Dr. Elsner/Murphy/Wainer Considering second  opinion but declines any referral today Declines other interventions at this time [place] loss/exercise discussed   Morbid obesity - BMI 35+ with comorbidities (T2DM, thn, hyperlipidemia) Discussed appropriate BMI Diet modification. Physical activity. Encouraged/praised to build confidence.   OSA on CPAP  Reports 100% compliance Improved with weight loss  Medication management All medications discussed and reviewed in full. All questions and concerns regarding medications addressed.     Orders Placed This Encounter  Procedures   CBC with Differential/Platelet   COMPLETE METABOLIC PANEL WITH GFR   Lipid panel   Hemoglobin A1c     Notify office for further evaluation and treatment, questions or concerns if any reported s/s fail to improve.   The patient was advised to call back or seek an in-person evaluation if any symptoms worsen or if the condition fails to improve as anticipated.   Further disposition pending results of labs. Discussed med's effects and SE's.    I discussed the assessment and treatment plan with the patient. The patient was provided an opportunity to ask questions and all were answered. The patient agreed with the plan and demonstrated an understanding of the instructions.  Discussed med's effects and SE's. Screening labs and tests as requested with regular follow-up as recommended.  I provided 25 minutes of face-to-face time during this encounter including counseling, chart review, and critical decision making was preformed.   Future Appointments  Date Time Provider [place]  03/14/2023  2:00 PM Darrol Jump, NP [facility] None  06/19/2023  2:30 PM Alycia Rossetti, NP [facility] None     Subjective:  Joshua Koch is a 76 y.o. male who presents for  3 month follow up. 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  Overall he reports feeling well today.  No new concerns   He has chronic lumbar pain with intermittent bilateral anterior thigh numbness, has been referred to Dr. Ellene Route, did PT but this made it worse, quit after 6 sessions, doing home exercises, taking celebrex 200 mg. Also saw Dr. Lorelei Pont at [facility] for injections, ablation, limited benefit. Currently just monitoring, taking tylenol as needed. Considering second opinion with a surgeon at [facility]. Losing weight has helped.  He has OSA and on CPAP, reports 100% compliance with restorative sleep.  Continuing weigh tloss.   BMI is Body mass index is 32.5 kg/m., he has been working on diet, exercise limited due to back pain and knee (follows ortho PRN, had shots).  Wt Readings from Last 3 Encounters:  12/05/22 233 lb (105.7 kg)  09/05/22 245 lb 9.6 oz (111.4 kg)  07/03/22 245 lb (111.1 kg)   His blood pressure has been controlled at home, today their BP is BP: (!) 140/68 He does not workout. He denies chest pain, shortness of breath, dizziness.   He has aortic atherosclerosis per Ct 2018  He is on cholesterol medication (simvastatin 20 mg daily) and denies myalgias. His cholesterol is not at goal. The cholesterol last visit was:   Lab Results  Component Value Date   CHOL 104  09/05/2022   HDL 32 (L) 09/05/2022   LDLCALC 49 09/05/2022   TRIG 155 (H) 09/05/2022   CHOLHDL 3.3 09/05/2022   He has been working on diet and exercise for T2 diabetes with CKD II  Lab Results  Component Value Date   HGBA1C 6.6 (H) 09/05/2022   He has baseline CKD II, drop last visit, on olmesartan 40 mg. Last GFR Lab Results  Component Value Date   EGFR 56 (L) 09/05/2022   EGFR 60 07/05/2022   EGFR 52 (L) 07/03/2022    Medication Review:  Current Outpatient Medications (Endocrine & Metabolic):    metFORMIN (GLUCOPHAGE-XR) 500 MG 24 hr tablet, Take 2 tablets by mouth twice daily with a meal for diabetes.   Semaglutide, 1 MG/DOSE, (OZEMPIC, 1 MG/DOSE,) 4 MG/3ML SOPN, Inject 1 mg into the skin once a week.  Current Outpatient Medications (Cardiovascular):    olmesartan (BENICAR) 40 MG tablet, Take 1 tablet (40 mg total) by mouth daily.   rosuvastatin (CRESTOR) 10 MG tablet, Take 1 tablet by mouth nightly for cholesterol.  Current Outpatient Medications (Respiratory):    cetirizine (ZYRTEC) 10 MG tablet, Take 10 mg by mouth daily.   ipratropium (ATROVENT) 0.06 % nasal spray, USE 1 TO 2 SPRAYS EACH NOSTRIL 2 TO 3 X /DAY AS NEEDED FOR WATERY RHINORRHEA  Current Outpatient Medications (Analgesics):    aspirin (ECOTRIN LOW STRENGTH) 81 MG EC tablet, Take 1 tablet daily  Current Outpatient Medications (Hematological):    Cyanocobalamin (B-12) 500 MCG SUBL, Place 1 tablet under the tongue daily.  Current Outpatient Medications (Other):    Ascorbic  Acid (VITAMIN C PO), Take by mouth. 1 tablet daily   CHOLECALCIFEROL PO, Take 10,000 Units by mouth daily.   Magnesium 400 MG CAPS, Take 1 capsule by mouth 2 (two) times daily.   Omega-3 Fatty Acids (FISH OIL PO), Take by mouth 2 (two) times daily.   Zinc 50 MG TABS, Take by mouth daily.   RSV vaccine recomb adjuvanted (AREXVY) 120 MCG/0.5ML injection, Inject into the muscle.  Allergies: Allergies  Allergen Reactions   No Known  Allergies     Current Problems (verified) has Type 2 diabetes mellitus with stage 2 chronic kidney disease, without long-term current use of insulin ([place]); Essential hypertension; Hyperlipidemia associated with type 2 diabetes mellitus ([place]); OA (osteoarthritis) of knee; Aortic atherosclerosis ([place]) by CT scan 2018; Vitamin D deficiency; Morbid obesity ([place]) - BMI 30+ with OSA; CKD stage 2 due to type 2 diabetes mellitus ([place]); Inappropriate diet or eating habits; Diabetic mononeuropathy associated with type 2 diabetes mellitus ([place]); Lumbar spondylosis; 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  Screening Tests Immunization History  Administered Date(s) Administered   PFIZER Comirnaty([place])Covid-19 Tri-Sucrose Vaccine 07/19/2021   PFIZER(Purple Top)SARS-COV-2 Vaccination 01/01/2020, 01/22/2020, 09/07/2020   Pfizer Covid-19 Vaccine Bivalent Booster 49yr & up 09/26/2021   Pneumococcal-Unspecified 05/05/2009, 03/03/2013   Respiratory Syncytial Virus Vaccine,Recomb Aduvanted(Arexvy) 11/05/2022   Td 05/05/2009   Tdap 06/21/2015   Zoster, Live 03/03/2013   Health Maintenance  Topic Date Due   Medicare Annual Wellness (AWV)  Never done   INFLUENZA VACCINE  Never done   COVID-19 Vaccine (6 - 2023-24 season) 08/10/2022   OPHTHALMOLOGY EXAM  01/22/2023   Diabetic kidney evaluation - Urine ACR  02/09/2023   FOOT EXAM  02/09/2023   HEMOGLOBIN A1C  03/06/2023   Diabetic kidney evaluation - eGFR measurement  09/06/2023   DTaP/Tdap/Td (3 - Td or Tdap) 06/20/2025   COLONOSCOPY (Pts 45-468yrInsurance coverage will need to be confirmed)  12/23/2029   Hepatitis C Screening  Completed   HPV VACCINES  Aged Out   Pneumonia  Vaccine 6550Years old  Discontinued   Zoster Vaccines- Shingrix  Discontinued    Patient Care Team: McUnk PintoMD as PCP - General (Internal Medicine) Tisovec, RiFransico HimMD (Internal Medicine)  Surgical: He  has a past surgical history that includes Knee arthroscopy; Pilonidal cyst excision; Colonoscopy; and Total knee arthroplasty (Right, 10/21/2017). Family His family history includes CAD (age of onset: 806in his brother; COPD in his brother; Heart attack (age of onset: 6065in his father; Lung cancer in his sister; Stroke in his brother. Social history  He reports that he has never smoked. He has never used smokeless tobacco. He reports current alcohol use of about 1.0 standard drink of alcohol per week. He reports that he does not use drugs.  Review of Systems  Constitutional:  Negative for malaise/fatigue and weight loss.  HENT:  Negative for hearing loss and tinnitus.   Eyes:  Negative for blurred vision and double vision.  Respiratory:  Negative for cough, sputum production, shortness of breath and wheezing.   Cardiovascular:  Negative for chest pain, palpitations, orthopnea, claudication, leg swelling and PND.  Gastrointestinal:  Negative for abdominal pain, blood in stool, constipation, diarrhea, heartburn, melena, nausea and vomiting.  Genitourinary: Negative.   Musculoskeletal:  Positive for back pain (chronic lumbar). Negative for falls, joint pain and myalgias.  Skin:  Negative for rash.  Neurological:  Positive for tingling (bil fee). Negative for dizziness, sensory change, weakness and headaches.  Endo/Heme/Allergies:  Negative for polydipsia.  Psychiatric/Behavioral: Negative.  Negative for depression, memory loss, substance abuse and suicidal ideas. The patient is not nervous/anxious and does not have insomnia.   All other systems reviewed and are negative.    Objective:   Today's Vitals   12/05/22 1428  BP: (!) 140/68  Pulse: 75  Temp: (!) 97.3 F (36.3  C)  SpO2: 98%  Weight: 233 lb (105.7 kg)  Height: _0  (1.803 m)    Body mass index is 32.5 kg/m.  General appearance: alert, no distress, WD/WN, male HEENT: normocephalic, sclerae anicteric, TMs pearly, nares patent, no discharge or erythema, pharynx normal Oral cavity: MMM, no lesions Neck: supple, no lymphadenopathy, no thyromegaly, no masses Heart: RRR, normal S1, S2, no murmurs Lungs: CTA bilaterally, no wheezes, rhonchi, or rales Abdomen: +bs, soft, obese, non tender, non distended, no masses, no hepatomegaly, no splenomegaly. Large ventral hernia with bearing down.  Musculoskeletal: nontender, no swelling, no obvious deformity Extremities: no edema, no cyanosis, no clubbing Pulses: 2+ symmetric, upper and lower extremities, normal cap refill Neurological: alert, oriented x 3, CN2-12 intact, strength normal upper extremities and lower extremities, sensation diminished bil feet, DTRs 2+ throughout, no cerebellar signs, gait steady. Sensation severely diminished bil feet to monofilament and touch.  Psychiatric: normal affect, behavior normal, pleasant    Darrol Jump, NP   12/05/2022

## 2022-12-05 NOTE — Patient Instructions (Signed)

## 2022-12-06 ENCOUNTER — Other Ambulatory Visit: Payer: Self-pay | Admitting: Nurse Practitioner

## 2022-12-06 DIAGNOSIS — E875 Hyperkalemia: Secondary | ICD-10-CM

## 2022-12-06 LAB — CBC WITH DIFFERENTIAL/PLATELET
Absolute Monocytes: 585 cells/uL (ref 200–950)
Basophils Absolute: 52 cells/uL (ref 0–200)
Basophils Relative: 0.6 %
Eosinophils Absolute: 206 cells/uL (ref 15–500)
Eosinophils Relative: 2.4 %
HCT: 42.7 % (ref 38.5–50.0)
Hemoglobin: 14.8 g/dL (ref 13.2–17.1)
Lymphs Abs: 2700 cells/uL (ref 850–3900)
MCH: 30.8 pg (ref 27.0–33.0)
MCHC: 34.7 g/dL (ref 32.0–36.0)
MCV: 89 fL (ref 80.0–100.0)
MPV: 10.3 fL (ref 7.5–12.5)
Monocytes Relative: 6.8 %
Neutro Abs: 5057 cells/uL (ref 1500–7800)
Neutrophils Relative %: 58.8 %
Platelets: 282 10*3/uL (ref 140–400)
RBC: 4.8 10*6/uL (ref 4.20–5.80)
RDW: 12.2 % (ref 11.0–15.0)
Total Lymphocyte: 31.4 %
WBC: 8.6 10*3/uL (ref 3.8–10.8)

## 2022-12-06 LAB — LIPID PANEL
Cholesterol: 115 mg/dL (ref <200)
HDL: 35 mg/dL — ABNORMAL LOW (ref >=40)
LDL Cholesterol (Calc): 59 mg/dL (calc)
Non-HDL Cholesterol (Calc): 80 mg/dL (calc) (ref <130)
Total CHOL/HDL Ratio: 3.3 (calc) (ref <5.0)
Triglycerides: 125 mg/dL (ref <150)

## 2022-12-06 LAB — COMPLETE METABOLIC PANEL WITH GFR
AG Ratio: 1.5 (calc) (ref 1.0–2.5)
ALT: 16 U/L (ref 9–46)
AST: 16 U/L (ref 10–35)
Albumin: 4.1 g/dL (ref 3.6–5.1)
Alkaline phosphatase (APISO): 39 U/L (ref 35–144)
BUN: 20 mg/dL (ref 7–25)
CO2: 26 mmol/L (ref 20–32)
Calcium: 9.6 mg/dL (ref 8.6–10.3)
Chloride: 106 mmol/L (ref 98–110)
Creat: 1.14 mg/dL (ref 0.70–1.28)
Globulin: 2.7 g/dL (calc) (ref 1.9–3.7)
Glucose, Bld: 88 mg/dL (ref 65–99)
Potassium: 5.6 mmol/L — ABNORMAL HIGH (ref 3.5–5.3)
Sodium: 139 mmol/L (ref 135–146)
Total Bilirubin: 0.3 mg/dL (ref 0.2–1.2)
Total Protein: 6.8 g/dL (ref 6.1–8.1)
eGFR: 67 mL/min/{1.73_m2} (ref >=60)

## 2022-12-06 LAB — HEMOGLOBIN A1C
Hgb A1c MFr Bld: 6.1 % of total Hgb — ABNORMAL HIGH (ref <5.7)
Mean Plasma Glucose: 128 mg/dL
eAG (mmol/L): 7.1 mmol/L

## 2022-12-11 ENCOUNTER — Other Ambulatory Visit (HOSPITAL_BASED_OUTPATIENT_CLINIC_OR_DEPARTMENT_OTHER): Payer: Self-pay

## 2022-12-11 ENCOUNTER — Ambulatory Visit (INDEPENDENT_AMBULATORY_CARE_PROVIDER_SITE_OTHER): Payer: PPO

## 2022-12-11 DIAGNOSIS — E875 Hyperkalemia: Secondary | ICD-10-CM

## 2022-12-11 NOTE — Progress Notes (Signed)
Patient here for a Nurse visit to recheck potassium level.

## 2022-12-12 ENCOUNTER — Encounter: Payer: Self-pay | Admitting: Nurse Practitioner

## 2022-12-12 ENCOUNTER — Other Ambulatory Visit: Payer: Self-pay | Admitting: Nurse Practitioner

## 2022-12-12 DIAGNOSIS — E875 Hyperkalemia: Secondary | ICD-10-CM

## 2022-12-12 LAB — POTASSIUM: Potassium: 5.8 mmol/L — ABNORMAL HIGH (ref 3.5–5.3)

## 2022-12-13 ENCOUNTER — Other Ambulatory Visit (HOSPITAL_BASED_OUTPATIENT_CLINIC_OR_DEPARTMENT_OTHER): Payer: Self-pay

## 2022-12-17 ENCOUNTER — Other Ambulatory Visit: Payer: Self-pay

## 2022-12-17 ENCOUNTER — Ambulatory Visit: Payer: PPO

## 2022-12-17 DIAGNOSIS — E875 Hyperkalemia: Secondary | ICD-10-CM

## 2022-12-18 LAB — POTASSIUM: Potassium: 5 mmol/L (ref 3.5–5.3)

## 2022-12-31 ENCOUNTER — Encounter: Payer: Self-pay | Admitting: Nurse Practitioner

## 2022-12-31 MED ORDER — OLMESARTAN MEDOXOMIL 20 MG PO TABS
20.0000 mg | ORAL_TABLET | Freq: Every day | ORAL | 2 refills | Status: DC
  Filled 2022-12-31: qty 90, 90d supply, fill #0
  Filled 2023-04-10: qty 90, 90d supply, fill #1
  Filled 2023-07-25: qty 90, 90d supply, fill #2

## 2023-01-01 ENCOUNTER — Other Ambulatory Visit (HOSPITAL_COMMUNITY): Payer: Self-pay

## 2023-01-26 ENCOUNTER — Other Ambulatory Visit (HOSPITAL_COMMUNITY): Payer: Self-pay

## 2023-02-06 DIAGNOSIS — H2513 Age-related nuclear cataract, bilateral: Secondary | ICD-10-CM | POA: Diagnosis not present

## 2023-02-06 DIAGNOSIS — H25013 Cortical age-related cataract, bilateral: Secondary | ICD-10-CM | POA: Diagnosis not present

## 2023-02-06 DIAGNOSIS — E103213 Type 1 diabetes mellitus with mild nonproliferative diabetic retinopathy with macular edema, bilateral: Secondary | ICD-10-CM | POA: Diagnosis not present

## 2023-02-06 LAB — HM DIABETES EYE EXAM

## 2023-02-08 ENCOUNTER — Encounter: Payer: Self-pay | Admitting: Internal Medicine

## 2023-02-13 ENCOUNTER — Encounter: Payer: PPO | Admitting: Nurse Practitioner

## 2023-03-12 ENCOUNTER — Other Ambulatory Visit (HOSPITAL_COMMUNITY): Payer: Self-pay

## 2023-03-14 ENCOUNTER — Ambulatory Visit (INDEPENDENT_AMBULATORY_CARE_PROVIDER_SITE_OTHER): Payer: PPO | Admitting: Nurse Practitioner

## 2023-03-14 ENCOUNTER — Encounter: Payer: Self-pay | Admitting: Nurse Practitioner

## 2023-03-14 VITALS — BP 108/62 | HR 72 | Temp 98.4°F | Ht 71.0 in | Wt 234.4 lb

## 2023-03-14 DIAGNOSIS — E1169 Type 2 diabetes mellitus with other specified complication: Secondary | ICD-10-CM | POA: Diagnosis not present

## 2023-03-14 DIAGNOSIS — Z79899 Other long term (current) drug therapy: Secondary | ICD-10-CM

## 2023-03-14 DIAGNOSIS — Z125 Encounter for screening for malignant neoplasm of prostate: Secondary | ICD-10-CM

## 2023-03-14 DIAGNOSIS — E785 Hyperlipidemia, unspecified: Secondary | ICD-10-CM | POA: Diagnosis not present

## 2023-03-14 DIAGNOSIS — Z136 Encounter for screening for cardiovascular disorders: Secondary | ICD-10-CM

## 2023-03-14 DIAGNOSIS — M179 Osteoarthritis of knee, unspecified: Secondary | ICD-10-CM

## 2023-03-14 DIAGNOSIS — I10 Essential (primary) hypertension: Secondary | ICD-10-CM

## 2023-03-14 DIAGNOSIS — I700 Atherosclerosis of aorta: Secondary | ICD-10-CM

## 2023-03-14 DIAGNOSIS — E559 Vitamin D deficiency, unspecified: Secondary | ICD-10-CM | POA: Diagnosis not present

## 2023-03-14 DIAGNOSIS — M47816 Spondylosis without myelopathy or radiculopathy, lumbar region: Secondary | ICD-10-CM

## 2023-03-14 DIAGNOSIS — R011 Cardiac murmur, unspecified: Secondary | ICD-10-CM

## 2023-03-14 DIAGNOSIS — Z0001 Encounter for general adult medical examination with abnormal findings: Secondary | ICD-10-CM | POA: Diagnosis not present

## 2023-03-14 DIAGNOSIS — E538 Deficiency of other specified B group vitamins: Secondary | ICD-10-CM

## 2023-03-14 DIAGNOSIS — Z0000 Encounter for general adult medical examination without abnormal findings: Secondary | ICD-10-CM | POA: Diagnosis not present

## 2023-03-14 DIAGNOSIS — E113299 Type 2 diabetes mellitus with mild nonproliferative diabetic retinopathy without macular edema, unspecified eye: Secondary | ICD-10-CM

## 2023-03-14 DIAGNOSIS — E1122 Type 2 diabetes mellitus with diabetic chronic kidney disease: Secondary | ICD-10-CM | POA: Diagnosis not present

## 2023-03-14 DIAGNOSIS — N182 Chronic kidney disease, stage 2 (mild): Secondary | ICD-10-CM | POA: Diagnosis not present

## 2023-03-14 DIAGNOSIS — E1141 Type 2 diabetes mellitus with diabetic mononeuropathy: Secondary | ICD-10-CM

## 2023-03-14 DIAGNOSIS — G4733 Obstructive sleep apnea (adult) (pediatric): Secondary | ICD-10-CM

## 2023-03-14 NOTE — Progress Notes (Signed)
CPE AND FOLLOW UP Assessment:   Encounter for Annual Physical Exam with abnormal findings Due annually  Health Maintenance reviewed Healthy lifestyle reviewed and goals set  Benign essential HTN Controlled Discussed DASH (Dietary Approaches to Stop Hypertension) DASH diet is lower in sodium than a typical American diet. Cut back on foods that are high in saturated fat, cholesterol, and trans fats. Eat more whole-grain foods, fish, poultry, and nuts Remain active and exercise as tolerated daily.  Monitor BP at home-Call if greater than 130/80.  Check CMP/CBC  Aortic atherosclerosis (HCC)/Hyperlipidemia Discussed lifestyle modifications. Recommended diet heavy in fruits and veggies, omega 3's. Decrease consumption of animal meats, cheeses, and dairy products. Remain active and exercise as tolerated. Continue to monitor. Check lipids/TSH  Type 2 diabetes mellitus with stage 2 chronic kidney disease, without long-term current use of insulin (HCC) Per patient preference and cost discontinue Semaglutide. Education: Reviewed 'ABCs' of diabetes management  Discussed goals to be met and/or maintained include A1C (<7) Blood pressure (<130/80) Cholesterol (LDL <70) Continue Eye Exam yearly  Continue Dental Exam Q6 mo Discussed dietary recommendations Discussed Physical Activity recommendations Foot exam UTD Check A1C  CKD stage 2 due to type 2 diabetes mellitus (HCC) Continue ACE, bASA, statin Discussed how what you eat and drink can aide in kidney protection. Stay well hydrated. Avoid high salt foods. Avoid NSAIDS. Keep BP and BG well controlled.   Take medications as prescribed. Remain active and exercise as tolerated daily. Maintain weight.  Continue to monitor. Check CMP/GFR/Microablumin  Diabetic mononeuropathy associated with type 2 diabetes mellitus (HCC) Daily foot checks Defers Gabapentin  Continue to monitor  ED associated with T2DM (HCC) Declines ED  medications Continue Zinc Continue to monitor  Diabetc retinopathy [facility]) Ophthalmology follows Keep BG and A1c well controlled  Osteoarthritis of knee, unspecified laterality, unspecified osteoarthritis type Celebrex, tylenol PRN; establisehd with ortho to follow up if needed Continue to monitor  Lumbar spondylosis Est with Dr. Danielle Dess, managing with celebrex Declines other interventions at this time [place] loss encouraged  Vitamin D deficiency Continue supplement for goal of 60-100 Monitor Vitamin D levels  Morbid obesity - BMI 35+ with comorbidities (T2DM, thn, hyperlipidemia) Discussed appropriate BMI Diet modification. Physical activity. Encouraged/praised to build confidence.  OSA on CPAP  Reports 100% compliance Continue weight loss  Heart murmur Monitor; no concerning sx  Continue to monitor  B12 def Continue supplement Monitor levels  Orders Placed This Encounter  Procedures   CBC with Differential/Platelet   COMPLETE METABOLIC PANEL WITH GFR   Magnesium   Lipid panel   TSH   Hemoglobin A1c   Insulin, random   VITAMIN D 25 Hydroxy (Vit-D Deficiency, Fractures)   Urinalysis, Routine w reflex microscopic   Microalbumin / creatinine urine ratio   PSA   EKG 12-Lead    Notify office for further evaluation and treatment, questions or concerns if any reported s/s fail to improve.   The patient was advised to call back or seek an in-person evaluation if any symptoms worsen or if the condition fails to improve as anticipated.   Further disposition pending results of labs. Discussed med's effects and SE's.    I discussed the assessment and treatment plan with the patient. The patient was provided an opportunity to ask questions and all were answered. The patient agreed with the plan and demonstrated an understanding of the instructions.  Discussed med's effects and SE's. Screening labs and tests as requested with regular follow-up as recommended.  I  provided  40 minutes of face-to-face time during this encounter including counseling, chart review, and critical decision making was preformed.  Today's Plan of Care is based on a patient-centered health care approach known as shared decision making - the decisions, tests and treatments allow for patient preferences and values to be balanced with clinical evidence.     Future Appointments  Date Time Provider Department Center  06/19/2023  2:30 PM Raynelle DickWilkinson, Dana E, NP [facility] None  03/16/2024  2:00 PM Adela Glimpseranford, Rosielee Corporan, NP [facility] None     Plan:   During the course of the visit the patient was educated and counseled about appropriate screening and preventive services including:   Pneumococcal vaccine  Influenza vaccine Prevnar 13 Td vaccine Screening electrocardiogram Colorectal cancer screening Diabetes screening Glaucoma screening Nutrition counseling    Subjective:  Joshua Koch is a 77 y.o. male who presents for CPE. 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  He is married, 2 daughters, 3rd grandchildren. 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   Overall he reports feeling well.    He follows with derm Dr.  SwazilandJordan annually for non-melanoma skin cancer, hx of R temple.  Has no new skin concerns today.  He has chronic lumbar pain with intermittent bilateral anterior thigh numbness, has been referred to Dr. Danielle DessElsner, did PT but this made it worse, quit after 6 sessions, doing home exercises, taking celebrex 200 mg. Was discussing further imaging/MRI and injections and ablation but doing well and holding off for now. He would prefer to avoid surgery, feels managing fairly well.  Working on weight loss  He has OSA and on CPAP, reports 100% compliance with restorative sleep.   BMI is Body mass index is 32.69 kg/m., he has been working on diet, exercise limited due to back pain and knee (follows ortho).  Wt Readings from Last 3 Encounters:  03/14/23 234 lb 6.4 oz (106.3 kg)  12/11/22 235 lb 6.4 oz (106.8 kg)  12/05/22 233 lb (105.7 kg)   His blood pressure has been controlled at home, today their BP is BP: 108/62 He does not workout. He denies chest pain, shortness of breath, dizziness.   He has aortic atherosclerosis per Ct 2018  He is on cholesterol medication (simvastatin 20 mg daily) and denies myalgias. His cholesterol is not at goal. The cholesterol last visit was:   Lab Results  Component Value Date   CHOL 115 12/05/2022   HDL 35 (L) 12/05/2022   LDLCALC 59 12/05/2022   TRIG 125 12/05/2022   CHOLHDL 3.3 12/05/2022   He has been working on diet and exercise for T2 diabetes with CKD II (on metformin 2000 mg, Semaglutide, no longer taking.  Trying to lose weight through lifestyle modifications. He is on bASA, he is on ACE/ARB and denies increased appetite, nausea, polydipsia, polyuria, visual disturbances and vomiting. He  does have neuropathy, very poor sensation in bil feet. Has glucometer but doesn't check regularly.  He had normal ABI 08/2020. Retinopathy per Dr. Randon GoldsmithLyles,  01/22/2022.  He has had ED for many years; numerous medications didn't help and declines further. Last A1C in the office was:   Lab Results  Component Value Date   HGBA1C 6.1 (H) 12/05/2022   He has baseline CKD II, drop last visit, on olmesartan 40 mg. Last GFR Lab Results  Component Value Date   GFRNONAA 46 (L) 10/19/2021   GFRNONAA 76 05/10/2021   GFRNONAA 85 02/06/2021    Patient is on Vitamin D supplement, unsure of dose.  Lab Results  Component Value Date   VD25OH 89 09/05/2022     Low B12 but never started on supplement, receptive to starting -  Lab Results  Component Value Date   VITAMINB12 226 02/08/2022   He denies LUTs. Last PSA:  Lab Results  Component Value Date   PSA 0.31 02/08/2022   PSA 1.26 02/06/2021   PSA 0.4 01/04/2020     Medication Review:  Current Outpatient Medications (Endocrine & Metabolic):    Semaglutide, 1 MG/DOSE, (OZEMPIC, 1 MG/DOSE,) 4 MG/3ML SOPN, Inject 1 mg into the skin once a week.   metFORMIN (GLUCOPHAGE-XR) 500 MG 24 hr tablet, Take 2 tablets by mouth twice daily with a meal for diabetes.  Current Outpatient Medications (Cardiovascular):    olmesartan (BENICAR) 20 MG tablet, Take 1 tablet (20 mg total) by mouth daily.   rosuvastatin (CRESTOR) 10 MG tablet, Take 1 tablet by mouth nightly for cholesterol.  Current Outpatient Medications (Respiratory):    cetirizine (ZYRTEC) 10 MG tablet, Take 10 mg by mouth daily.   ipratropium (ATROVENT) 0.06 % nasal spray, USE 1 TO 2 SPRAYS EACH NOSTRIL 2 TO 3 X /DAY AS NEEDED FOR WATERY RHINORRHEA  Current Outpatient Medications (Analgesics):    aspirin (ECOTRIN LOW STRENGTH) 81 MG EC tablet, Take 1 tablet daily  Current Outpatient Medications (Hematological):    Cyanocobalamin (B-12) 500 MCG SUBL, Place 1 tablet under the tongue daily.  Current Outpatient Medications (Other):    Omega-3 Fatty Acids (FISH OIL PO), Take by mouth 2 (two) times daily.   Zinc 50 MG TABS, Take by mouth daily.   Ascorbic Acid (VITAMIN C PO), Take by mouth. 1 tablet daily   CHOLECALCIFEROL PO, Take 10,000 Units by mouth daily.    Magnesium 400 MG CAPS, Take 1 capsule by mouth 2 (two) times daily.   RSV vaccine recomb adjuvanted (AREXVY) 120 MCG/0.5ML injection, Inject into the muscle.  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  Screening Tests Immunization History  Administered Date(s) Administered   PFIZER Comirnaty(Gray Top)Covid-19 Tri-Sucrose Vaccine 07/19/2021   PFIZER(Purple Top)SARS-COV-2 Vaccination 01/01/2020, 01/22/2020, 09/07/2020   Pfizer  Covid-19 Vaccine Bivalent Booster 59yrs & up 09/26/2021   Pneumococcal-Unspecified 05/05/2009, 03/03/2013   Respiratory Syncytial Virus Vaccine,Recomb Aduvanted(Arexvy) 11/05/2022   Td 05/05/2009   Tdap 06/21/2015   Zoster, Live 03/03/2013   Health Maintenance  Topic Date Due   COVID-19 Vaccine (6 - 2023-24 season) 08/10/2022   Diabetic kidney evaluation - Urine ACR  02/09/2023   FOOT EXAM  02/09/2023   Medicare Annual Wellness (AWV)  05/30/2023   HEMOGLOBIN A1C  06/06/2023   INFLUENZA VACCINE  07/11/2023   Diabetic kidney evaluation - eGFR measurement  12/06/2023   OPHTHALMOLOGY EXAM  02/07/2024   DTaP/Tdap/Td (3 -  Td or Tdap) 06/20/2025   COLONOSCOPY (Pts 45-34yrs Insurance coverage will need to be confirmed)  12/23/2029   Hepatitis C Screening  Completed   HPV VACCINES  Aged Out   Pneumonia Vaccine 72+ Years old  Discontinued   Zoster Vaccines- Shingrix  Discontinued   Last colonoscopy: Dr Kinnie Scales, 12/24/2019 due 2031  Influenza: Declines  Pneumococcal: 2014 Prevnar13: Declines Zostavax: 02/2013, declines shingrix  Names of Other Physician/Practitioners you currently use: 1. [facility] here for primary care 2. Eye Exam, Dr. Randon Goldsmith,  Diabetic 01/22/2022,  retinopathy  3. Dental Exam, Dr. Cain Saupe, last in 2021 4. Derm, Dr. Amy Swaziland, last 01/2021  Patient Care Team: Lucky Cowboy, MD as PCP - General (Internal Medicine) Tisovec, Adelfa Koh, MD (Internal Medicine)  Surgical: He  has a past surgical history that includes Knee arthroscopy; Pilonidal cyst excision; Colonoscopy; and Total knee arthroplasty (Right, 10/21/2017). Family His family history includes CAD (age of onset: 8) in his brother; COPD in his brother; Heart attack (age of onset: 35) in his father; Lung cancer in his sister; Stroke in his brother. Social history  He reports that he has never smoked. He has never used smokeless tobacco. He reports current alcohol use of about 1.0 standard drink of alcohol per week. He reports that he does not use drugs.   Review of Systems  Constitutional:  Negative for malaise/fatigue and weight loss.  HENT:  Negative for hearing loss and tinnitus.   Eyes:  Negative for blurred vision and double vision.  Respiratory:  Negative for cough, sputum production, shortness of breath and wheezing.   Cardiovascular:  Negative for chest pain, palpitations, orthopnea, claudication, leg swelling and PND.  Gastrointestinal:  Negative for abdominal pain, blood in stool, constipation, diarrhea, heartburn, melena, nausea and vomiting.  Genitourinary: Negative.    Musculoskeletal:  Positive for back pain (chronic lumbar). Negative for falls, joint pain and myalgias.  Skin:  Negative for rash.  Neurological:  Positive for tingling (bil fee). Negative for dizziness, sensory change, weakness and headaches.  Endo/Heme/Allergies:  Negative for polydipsia.  Psychiatric/Behavioral: Negative.  Negative for depression, memory loss, substance abuse and suicidal ideas. The patient is not nervous/anxious and does not have insomnia.   All other systems reviewed and are negative.     Objective:   Today's Vitals   03/14/23 1359  BP: 108/62  Pulse: 72  Temp: 98.4 F (36.9 C)  SpO2: 99%  Weight: 234 lb 6.4 oz (106.3 kg)  Height: 5\' 11"  (1.803 m)    Body mass index is 32.69 kg/m.  General appearance: alert, no distress, WD/WN, male morbid obese HEENT: normocephalic, sclerae anicteric, TMs pearly, nares patent, no discharge or erythema, pharynx normal Oral cavity: MMM, no lesions Neck: supple, no lymphadenopathy, no thyromegaly, no masses Heart: RRR, normal S1, S2, no murmurs Lungs: CTA bilaterally, no wheezes, rhonchi,  or rales Abdomen: +bs, soft, obese, non tender, non distended, no masses, no hepatomegaly, no splenomegaly. Large ventral hernia with bearing down.  Musculoskeletal: nontender, no swelling, no obvious deformity Extremities: no edema, no cyanosis, no clubbing Pulses: 2+ symmetric, upper and lower extremities, normal cap refill Neurological: alert, oriented x 3, CN2-12 intact, strength normal upper extremities and lower extremities, sensation diminished bil feet, DTRs 2+ throughout, no cerebellar signs, gait steady. Sensation severely diminished bil feet to monofilament and touch.  Psychiatric: normal affect, behavior normal, pleasant  GU: no concerns, declines   EKG: NSR  Dinari Stgermaine, NP   03/14/2023

## 2023-03-15 LAB — LIPID PANEL
Cholesterol: 112 mg/dL (ref <200)
HDL: 36 mg/dL — ABNORMAL LOW (ref >=40)
LDL Cholesterol (Calc): 53 mg/dL (calc)
Non-HDL Cholesterol (Calc): 76 mg/dL (calc) (ref <130)
Total CHOL/HDL Ratio: 3.1 (calc) (ref <5.0)
Triglycerides: 145 mg/dL (ref <150)

## 2023-03-15 LAB — CBC WITH DIFFERENTIAL/PLATELET
Absolute Monocytes: 710 cells/uL (ref 200–950)
Basophils Absolute: 58 cells/uL (ref 0–200)
Basophils Relative: 0.6 %
Eosinophils Absolute: 259 cells/uL (ref 15–500)
Eosinophils Relative: 2.7 %
HCT: 44.7 % (ref 38.5–50.0)
Hemoglobin: 15.1 g/dL (ref 13.2–17.1)
Lymphs Abs: 2851.2 cells/uL (ref 850–3900)
MCH: 30.3 pg (ref 27.0–33.0)
MCHC: 33.8 g/dL (ref 32.0–36.0)
MCV: 89.6 fL (ref 80.0–100.0)
MPV: 10.2 fL (ref 7.5–12.5)
Monocytes Relative: 7.4 %
Neutro Abs: 5722 cells/uL (ref 1500–7800)
Neutrophils Relative %: 59.6 %
Platelets: 303 10*3/uL (ref 140–400)
RBC: 4.99 10*6/uL (ref 4.20–5.80)
RDW: 12.3 % (ref 11.0–15.0)
Total Lymphocyte: 29.7 %
WBC: 9.6 10*3/uL (ref 3.8–10.8)

## 2023-03-15 LAB — VITAMIN D 25 HYDROXY (VIT D DEFICIENCY, FRACTURES): Vit D, 25-Hydroxy: 107 ng/mL — ABNORMAL HIGH (ref 30–100)

## 2023-03-15 LAB — COMPLETE METABOLIC PANEL WITH GFR
AG Ratio: 1.6 (calc) (ref 1.0–2.5)
ALT: 16 U/L (ref 9–46)
AST: 16 U/L (ref 10–35)
Albumin: 4.4 g/dL (ref 3.6–5.1)
Alkaline phosphatase (APISO): 45 U/L (ref 35–144)
BUN: 21 mg/dL (ref 7–25)
CO2: 24 mmol/L (ref 20–32)
Calcium: 9.9 mg/dL (ref 8.6–10.3)
Chloride: 105 mmol/L (ref 98–110)
Creat: 1.26 mg/dL (ref 0.70–1.28)
Globulin: 2.8 g/dL (calc) (ref 1.9–3.7)
Glucose, Bld: 86 mg/dL (ref 65–99)
Potassium: 5.2 mmol/L (ref 3.5–5.3)
Sodium: 142 mmol/L (ref 135–146)
Total Bilirubin: 0.5 mg/dL (ref 0.2–1.2)
Total Protein: 7.2 g/dL (ref 6.1–8.1)
eGFR: 59 mL/min/{1.73_m2} — ABNORMAL LOW (ref >=60)

## 2023-03-15 LAB — HEMOGLOBIN A1C
Hgb A1c MFr Bld: 6.2 % of total Hgb — ABNORMAL HIGH (ref <5.7)
Mean Plasma Glucose: 131 mg/dL
eAG (mmol/L): 7.3 mmol/L

## 2023-03-15 LAB — MICROALBUMIN / CREATININE URINE RATIO
Creatinine, Urine: 23 mg/dL (ref 20–320)
Microalb, Ur: <0.2 mg/dL

## 2023-03-15 LAB — URINALYSIS, ROUTINE W REFLEX MICROSCOPIC
Bilirubin Urine: NEGATIVE
Glucose, UA: NEGATIVE
Hgb urine dipstick: NEGATIVE
Ketones, ur: NEGATIVE
Leukocytes,Ua: NEGATIVE
Nitrite: NEGATIVE
Protein, ur: NEGATIVE
Specific Gravity, Urine: 1.003 (ref 1.001–1.035)
pH: 6 (ref 5.0–8.0)

## 2023-03-15 LAB — PSA: PSA: 0.3 ng/mL (ref <=4.00)

## 2023-03-15 LAB — INSULIN, RANDOM: Insulin: 17.3 u[IU]/mL

## 2023-03-15 LAB — MAGNESIUM: Magnesium: 2.1 mg/dL (ref 1.5–2.5)

## 2023-03-15 LAB — TSH: TSH: 3.29 mIU/L (ref 0.40–4.50)

## 2023-03-17 NOTE — Patient Instructions (Signed)

## 2023-04-01 ENCOUNTER — Other Ambulatory Visit (HOSPITAL_BASED_OUTPATIENT_CLINIC_OR_DEPARTMENT_OTHER): Payer: Self-pay

## 2023-04-03 DIAGNOSIS — D2262 Melanocytic nevi of left upper limb, including shoulder: Secondary | ICD-10-CM | POA: Diagnosis not present

## 2023-04-03 DIAGNOSIS — D0461 Carcinoma in situ of skin of right upper limb, including shoulder: Secondary | ICD-10-CM | POA: Diagnosis not present

## 2023-04-03 DIAGNOSIS — L570 Actinic keratosis: Secondary | ICD-10-CM | POA: Diagnosis not present

## 2023-04-03 DIAGNOSIS — L821 Other seborrheic keratosis: Secondary | ICD-10-CM | POA: Diagnosis not present

## 2023-04-03 DIAGNOSIS — D2261 Melanocytic nevi of right upper limb, including shoulder: Secondary | ICD-10-CM | POA: Diagnosis not present

## 2023-04-03 DIAGNOSIS — D485 Neoplasm of uncertain behavior of skin: Secondary | ICD-10-CM | POA: Diagnosis not present

## 2023-04-03 DIAGNOSIS — D692 Other nonthrombocytopenic purpura: Secondary | ICD-10-CM | POA: Diagnosis not present

## 2023-04-03 DIAGNOSIS — L817 Pigmented purpuric dermatosis: Secondary | ICD-10-CM | POA: Diagnosis not present

## 2023-04-03 DIAGNOSIS — D225 Melanocytic nevi of trunk: Secondary | ICD-10-CM | POA: Diagnosis not present

## 2023-04-03 DIAGNOSIS — Z85828 Personal history of other malignant neoplasm of skin: Secondary | ICD-10-CM | POA: Diagnosis not present

## 2023-04-08 ENCOUNTER — Other Ambulatory Visit (HOSPITAL_BASED_OUTPATIENT_CLINIC_OR_DEPARTMENT_OTHER): Payer: Self-pay

## 2023-04-11 ENCOUNTER — Other Ambulatory Visit (HOSPITAL_COMMUNITY): Payer: Self-pay

## 2023-04-23 DIAGNOSIS — H903 Sensorineural hearing loss, bilateral: Secondary | ICD-10-CM | POA: Diagnosis not present

## 2023-05-06 ENCOUNTER — Other Ambulatory Visit: Payer: Self-pay | Admitting: Nurse Practitioner

## 2023-05-06 DIAGNOSIS — E1122 Type 2 diabetes mellitus with diabetic chronic kidney disease: Secondary | ICD-10-CM

## 2023-05-06 MED ORDER — METFORMIN HCL ER 500 MG PO TB24
1000.0000 mg | ORAL_TABLET | Freq: Two times a day (BID) | ORAL | 3 refills | Status: DC
Start: 2023-05-06 — End: 2024-05-21
  Filled 2023-05-06: qty 360, 90d supply, fill #0
  Filled 2023-08-13: qty 360, 90d supply, fill #1
  Filled 2023-11-06: qty 360, 90d supply, fill #2
  Filled 2024-02-13: qty 360, 90d supply, fill #3

## 2023-05-07 ENCOUNTER — Other Ambulatory Visit (HOSPITAL_COMMUNITY): Payer: Self-pay

## 2023-05-07 ENCOUNTER — Other Ambulatory Visit: Payer: Self-pay

## 2023-05-29 DIAGNOSIS — H903 Sensorineural hearing loss, bilateral: Secondary | ICD-10-CM | POA: Diagnosis not present

## 2023-05-30 ENCOUNTER — Ambulatory Visit: Payer: PPO | Admitting: Nurse Practitioner

## 2023-06-19 ENCOUNTER — Ambulatory Visit: Payer: PPO | Admitting: Nurse Practitioner

## 2023-06-21 ENCOUNTER — Other Ambulatory Visit: Payer: Self-pay

## 2023-06-24 ENCOUNTER — Other Ambulatory Visit (HOSPITAL_COMMUNITY): Payer: Self-pay

## 2023-06-24 ENCOUNTER — Other Ambulatory Visit: Payer: Self-pay | Admitting: Internal Medicine

## 2023-06-24 DIAGNOSIS — E1169 Type 2 diabetes mellitus with other specified complication: Secondary | ICD-10-CM

## 2023-06-24 MED ORDER — ROSUVASTATIN CALCIUM 10 MG PO TABS
10.0000 mg | ORAL_TABLET | Freq: Every day | ORAL | 3 refills | Status: AC
Start: 2023-06-24 — End: ?
  Filled 2023-06-24: qty 90, 90d supply, fill #0
  Filled 2023-09-26: qty 90, 90d supply, fill #1
  Filled 2023-12-29: qty 90, 90d supply, fill #2
  Filled 2024-04-02 – 2024-04-10 (×2): qty 90, 90d supply, fill #3

## 2023-06-27 ENCOUNTER — Ambulatory Visit: Payer: PPO | Admitting: Nurse Practitioner

## 2023-06-29 ENCOUNTER — Other Ambulatory Visit (HOSPITAL_COMMUNITY): Payer: Self-pay

## 2023-07-08 ENCOUNTER — Ambulatory Visit (INDEPENDENT_AMBULATORY_CARE_PROVIDER_SITE_OTHER): Payer: PPO | Admitting: Nurse Practitioner

## 2023-07-08 ENCOUNTER — Encounter: Payer: Self-pay | Admitting: Nurse Practitioner

## 2023-07-08 VITALS — BP 118/62 | HR 60 | Temp 97.9°F | Ht 71.0 in | Wt 236.6 lb

## 2023-07-08 DIAGNOSIS — E559 Vitamin D deficiency, unspecified: Secondary | ICD-10-CM | POA: Diagnosis not present

## 2023-07-08 DIAGNOSIS — I10 Essential (primary) hypertension: Secondary | ICD-10-CM

## 2023-07-08 DIAGNOSIS — Z79899 Other long term (current) drug therapy: Secondary | ICD-10-CM

## 2023-07-08 DIAGNOSIS — E785 Hyperlipidemia, unspecified: Secondary | ICD-10-CM | POA: Diagnosis not present

## 2023-07-08 DIAGNOSIS — E538 Deficiency of other specified B group vitamins: Secondary | ICD-10-CM

## 2023-07-08 DIAGNOSIS — Z0001 Encounter for general adult medical examination with abnormal findings: Secondary | ICD-10-CM | POA: Diagnosis not present

## 2023-07-08 DIAGNOSIS — G4733 Obstructive sleep apnea (adult) (pediatric): Secondary | ICD-10-CM

## 2023-07-08 DIAGNOSIS — N521 Erectile dysfunction due to diseases classified elsewhere: Secondary | ICD-10-CM

## 2023-07-08 DIAGNOSIS — M179 Osteoarthritis of knee, unspecified: Secondary | ICD-10-CM

## 2023-07-08 DIAGNOSIS — E1141 Type 2 diabetes mellitus with diabetic mononeuropathy: Secondary | ICD-10-CM

## 2023-07-08 DIAGNOSIS — Z85828 Personal history of other malignant neoplasm of skin: Secondary | ICD-10-CM

## 2023-07-08 DIAGNOSIS — R6889 Other general symptoms and signs: Secondary | ICD-10-CM | POA: Diagnosis not present

## 2023-07-08 DIAGNOSIS — M47816 Spondylosis without myelopathy or radiculopathy, lumbar region: Secondary | ICD-10-CM

## 2023-07-08 DIAGNOSIS — E113299 Type 2 diabetes mellitus with mild nonproliferative diabetic retinopathy without macular edema, unspecified eye: Secondary | ICD-10-CM | POA: Diagnosis not present

## 2023-07-08 DIAGNOSIS — E1169 Type 2 diabetes mellitus with other specified complication: Secondary | ICD-10-CM | POA: Diagnosis not present

## 2023-07-08 DIAGNOSIS — N182 Chronic kidney disease, stage 2 (mild): Secondary | ICD-10-CM | POA: Diagnosis not present

## 2023-07-08 DIAGNOSIS — E1122 Type 2 diabetes mellitus with diabetic chronic kidney disease: Secondary | ICD-10-CM

## 2023-07-08 DIAGNOSIS — R011 Cardiac murmur, unspecified: Secondary | ICD-10-CM | POA: Diagnosis not present

## 2023-07-08 DIAGNOSIS — I700 Atherosclerosis of aorta: Secondary | ICD-10-CM

## 2023-07-08 DIAGNOSIS — Z0000 Encounter for general adult medical examination without abnormal findings: Secondary | ICD-10-CM

## 2023-07-08 LAB — CBC WITH DIFFERENTIAL/PLATELET
Absolute Monocytes: 615 cells/uL (ref 200–950)
Basophils Absolute: 49 cells/uL (ref 0–200)
Basophils Relative: 0.6 %
Eosinophils Absolute: 213 cells/uL (ref 15–500)
Eosinophils Relative: 2.6 %
HCT: 43.7 % (ref 38.5–50.0)
Hemoglobin: 14.7 g/dL (ref 13.2–17.1)
Lymphs Abs: 2632 cells/uL (ref 850–3900)
MCH: 30.5 pg (ref 27.0–33.0)
MCHC: 33.6 g/dL (ref 32.0–36.0)
MCV: 90.7 fL (ref 80.0–100.0)
MPV: 10.4 fL (ref 7.5–12.5)
Monocytes Relative: 7.5 %
Neutro Abs: 4690 cells/uL (ref 1500–7800)
Neutrophils Relative %: 57.2 %
Platelets: 258 10*3/uL (ref 140–400)
RBC: 4.82 10*6/uL (ref 4.20–5.80)
RDW: 12.1 % (ref 11.0–15.0)
Total Lymphocyte: 32.1 %
WBC: 8.2 10*3/uL (ref 3.8–10.8)

## 2023-07-08 NOTE — Patient Instructions (Signed)

## 2023-07-08 NOTE — Progress Notes (Signed)
ANNUAL WELLNESS VISIT AND FOLLOW UP  Assessment:    Annual Medicare Wellness Visit Due annually  Health maintenance reviewed *Declines all vaccines  Benign essential HTN Controlled Discussed DASH (Dietary Approaches to Stop Hypertension) DASH diet is lower in sodium than a typical American diet. Cut back on foods that are high in saturated fat, cholesterol, and trans fats. Eat more whole-grain foods, fish, poultry, and nuts Remain active and exercise as tolerated daily.  Monitor BP at home-Call if greater than 130/80.  Check CMP/CBC  Aortic atherosclerosis (HCC) - Per CT 2018 Control blood pressure, cholesterol, glucose, increase exercise.   Type 2 diabetes mellitus with stage 2 chronic kidney disease, without long-term current use of insulin (HCC) No longer on Ozempic Continue Metformin Education: Reviewed 'ABCs' of diabetes management  Discussed goals to be met and/or maintained include A1C (<7) Blood pressure (<130/80) Cholesterol (LDL <70) Continue Eye Exam yearly  Continue Dental Exam Q6 mo Discussed dietary recommendations Discussed Physical Activity recommendations Foot exam UTD Check A1C  Hyperlipidemia associated with type 2 diabetes mellitus (HCC) Discussed lifestyle modifications. Recommended diet heavy in fruits and veggies, omega 3's. Decrease consumption of animal meats, cheeses, and dairy products. Remain active and exercise as tolerated. Continue to monitor. Check lipids/TSH  CKD stage 2 due to type 2 diabetes mellitus (HCC) Discussed how what you eat and drink can aide in kidney protection. Stay well hydrated. Avoid high salt foods. Avoid NSAIDS. Keep BP and BG well controlled.   Take medications as prescribed. Remain active and exercise as tolerated daily. Maintain weight.  Continue to monitor. Check CMP/GFR/Microablumin  Diabetic mononeuropathy associated with type 2 diabetes mellitus (HCC) Check feet daily. Monitor B12 levels  ED  associated with T2DM (HCC) Declines meds Continue Zinc  Diabetc retinopathy [facility]) Ophthalmology follows;  Continue tight glucose control  Osteoarthritis of knee, unspecified laterality, unspecified osteoarthritis type Celebrex, tylenol PRN Ortho following  Lumbar spondylosis Est with Dr. [facility] Declines other interventions at this time [facility] loss/exercise discussed  Vitamin D deficiency Continue supplement for goal of 60-100 Monitor Vitamin D levels  Morbid obesity - BMI 30+ with comorbidities (T2DM, thn, hyperlipidemia) Excellent progress over the last year with lifestyle modification;  Education: Reviewed 'ABCs' of diabetes management  Discussed goals to be met and/or maintained include A1C (<7) Blood pressure (<130/80) Cholesterol (LDL <70) Continue Eye Exam yearly  Continue Dental Exam Q6 mo Discussed dietary recommendations Discussed Physical Activity recommendations Check A1C  OSA on CPAP  Reports 100% compliance Continue weight loss  Heart murmur Monitor; no concerning sx   B12 def Continue 500 mcg SL daily; check levels next visit   History of SCC/BCC Protect skin, continue follow up with derm for screening  Orders Placed This Encounter  Procedures   CBC with Differential/Platelet   COMPLETE METABOLIC PANEL WITH GFR   Lipid panel   Hemoglobin A1C w/out eAG   VITAMIN D 25 Hydroxy (Vit-D Deficiency, Fractures)    Notify office for further evaluation and treatment, questions or concerns if any reported s/s fail to improve.   The patient was advised to call back or seek an in-person evaluation if any symptoms worsen or if the condition fails to improve as anticipated.   Further disposition pending results of labs. Discussed med's effects and SE's.    I discussed the assessment and treatment plan with the patient. The patient was provided an opportunity to ask questions and all were answered. The patient agreed with the plan and  demonstrated an understanding of the  instructions.  Discussed med's effects and SE's. Screening labs and tests as requested with regular follow-up as recommended.  I provided 35 minutes of face-to-face time during this encounter including counseling, chart review, and critical decision making was preformed.  Today's Plan of Care is based on a patient-centered health care approach known as shared decision making - the decisions, tests and treatments allow for patient preferences and values to be balanced with clinical evidence.     Future Appointments  Date Time Provider Department Center  03/16/2024  2:00 PM Adela Glimpse, NP [facility] None  07/07/2024  2:30 PM Clarke Peretz, Archie Patten, NP [facility] None     Plan:   During the course of the visit the patient was educated and counseled about appropriate screening and preventive services including:   Pneumococcal vaccine  Influenza vaccine Prevnar 13 Td vaccine Screening electrocardiogram Colorectal cancer screening Diabetes screening Glaucoma screening Nutrition counseling    Subjective:  Joshua Koch is a 77 y.o. male who presents for AWV and 3 month follow up. 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  Overall he reports feeling well today.  He has no new concerns at this time.  He is married, 2 daughters, 3 grandchildren. He is a retired Psychologist, sport and exercise for [employer].   He has noted more trouble with hearing, having some trouble hearing grandkids, would like audiology referral today.   He follows with derm Dr. Swazil[place] annually for non-melanoma skin cancer, hx of R temple and also recently had R wrist lesion removed.   He has chronic lumbar pain with intermittent bilateral anterior thigh numbness, has been referred to Dr. Danielle Dess, did PT but this made it worse, quit after 6 sessions, doing home exercises, taking celebrex 200 mg. Also saw Dr. Vicente Masson at [facility] for injections, ablation, limited benefit. Currently just monitoring, taking tylenol as needed. Considering second opinion with a surgeon at [facility].   He has OSA and on CPAP, reports 100% compliance with restorative sleep.   BMI is Body mass index is 33 kg/m., he has been working on diet, exercise limited due to back pain and knee (follows ortho PRN, had shots).  He has been prescribed phentermine/topamax in the past but stopped due to not liking how he felt with medications.  Has been doing well reducing portions of starches, etc.  Has tried ozempic. Wt Readings from Last 3 Encounters:  07/08/23 236 lb 9.6 oz (107.3 kg)  03/14/23 234 lb 6.4 oz (106.3 kg)  12/11/22 235 lb 6.4 oz (106.8 kg)   His blood pressure has been controlled at home, today their BP is BP: 118/62 He does not workout. He denies chest pain, shortness of breath, dizziness.   He has aortic atherosclerosis per Ct 2018  He is on cholesterol medication (simvastatin 20 mg daily) and denies myalgias. His cholesterol is not  at goal. The cholesterol last visit was:   Lab Results  Component Value Date   CHOL 112 03/14/2023   HDL 36 (L) 03/14/2023   LDLCALC 53 03/14/2023   TRIG 145 03/14/2023   CHOLHDL 3.1  03/14/2023   He has been working on diet and exercise for T2 diabetes with CKD II (on metformin 2000 mg, glimepiride 2 mg BID - dose reduced to assist with weight loss), he is on bASA, he is on ACE/ARB and denies increased appetite, nausea, polydipsia, polyuria, visual disturbances and vomiting. He does have neuropathy, very poor sensation in bil feet. Has glucometer but doesn't check regularly.  He had normal ABI 08/2020. Retinopathy per Dr. Randon Goldsmith, last 01/22/2022.  He has had ED for many years; numerous medications didn't help and declines further.  Has glucometer but doesn't check regularly.  Last A1C in the office was:  Lab Results  Component Value Date   HGBA1C 6.2 (H) 03/14/2023   He has baseline CKD II, drop last visit, on olmesartan 40 mg. Last GFR Lab Results  Component Value Date   EGFR 59 (L) 03/14/2023   EGFR 67 12/05/2022   EGFR 56 (L) 09/05/2022    Patient is on Vitamin D supplement, unsure of dose.  Lab Results  Component Value Date   VD25OH 107 (H) 03/14/2023     Low B12, has started on sublingual B12 500 mcg Lab Results  Component Value Date   VITAMINB12 226 02/08/2022    Medication Review:  Current Outpatient Medications (Endocrine & Metabolic):    metFORMIN (GLUCOPHAGE-XR) 500 MG 24 hr tablet, Take 2 tablets (1,000 mg total) by mouth 2 (two) times daily with a meal for diabetes.   Semaglutide, 1 MG/DOSE, (OZEMPIC, 1 MG/DOSE,) 4 MG/3ML SOPN, Inject 1 mg into the skin once a week.  Current Outpatient Medications (Cardiovascular):    olmesartan (BENICAR) 20 MG tablet, Take 1 tablet (20 mg total) by mouth daily.   rosuvastatin (CRESTOR) 10 MG tablet, Take 1 tablet (10 mg total) by mouth daily for cholesterol  Current Outpatient Medications (Respiratory):    cetirizine (ZYRTEC) 10 MG tablet, Take 10 mg by mouth daily.   ipratropium (ATROVENT) 0.06 % nasal spray, USE 1 TO 2 SPRAYS EACH NOSTRIL 2 TO 3 X /DAY AS NEEDED FOR WATERY RHINORRHEA  Current Outpatient  Medications (Analgesics):    aspirin (ECOTRIN LOW STRENGTH) 81 MG EC tablet, Take 1 tablet daily  Current Outpatient Medications (Hematological):    Cyanocobalamin (B-12) 500 MCG SUBL, Place 1 tablet under the tongue daily.  Current Outpatient Medications (Other):    Ascorbic Acid (VITAMIN C PO), Take by mouth. 1 tablet daily   CHOLECALCIFEROL PO, Take 10,000 Units by mouth daily.   Magnesium 400 MG CAPS, Take 1 capsule by mouth 2 (two) times daily.   Omega-3 Fatty Acids (FISH OIL PO), Take by mouth 2 (two) times daily.   Zinc 50 MG TABS, Take by mouth daily.   RSV vaccine recomb adjuvanted (AREXVY) 120 MCG/0.5ML injection, Inject into the muscle.  Allergies: Allergies  Allergen Reactions   No Known Allergies     Current Problems (verified) has Type 2 diabetes mellitus with stage 2 chronic kidney disease, without long-term current use of insulin (HCC); Essential hypertension; Hyperlipidemia associated with type 2 diabetes mellitus (HCC); OA (osteoarthritis) of knee; Aortic atherosclerosis (HCC) by CT scan 2018; Vitamin D deficiency; Morbid obesity (HCC) - BMI 30+ with OSA; CKD stage 2 due to type 2 diabetes mellitus (HCC); Inappropriate diet or eating  habits; Diabetic mononeuropathy associated with type 2 diabetes mellitus (HCC); Lumbar spondylosis; 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  Screening Tests Immunization History  Administered Date(s) Administered   PFIZER Comirnaty(Gray Top)Covid-19 Tri-Sucrose Vaccine 07/19/2021   PFIZER(Purple Top)SARS-COV-2 Vaccination 01/01/2020, 01/22/2020, 09/07/2020   Pfizer Covid-19 Vaccine Bivalent Booster 30yrs & up 09/26/2021   Pneumococcal-Unspecified 05/05/2009,  03/03/2013   Respiratory Syncytial Virus Vaccine,Recomb Aduvanted(Arexvy) 11/05/2022   Td 05/05/2009   Tdap 06/21/2015   Zoster, Live 03/03/2013   Health Maintenance  Topic Date Due   COVID-19 Vaccine (6 - 2023-24 season) 08/10/2022   FOOT EXAM  02/09/2023   INFLUENZA VACCINE  07/11/2023   HEMOGLOBIN A1C  09/13/2023   OPHTHALMOLOGY EXAM  02/07/2024   Diabetic kidney evaluation - eGFR measurement  03/13/2024   Diabetic kidney evaluation - Urine ACR  03/13/2024   Medicare Annual Wellness (AWV)  07/07/2024   DTaP/Tdap/Td (3 - Td or Tdap) 06/20/2025   Colonoscopy  12/23/2029   Hepatitis C Screening  Completed   HPV VACCINES  Aged Out   Pneumonia Vaccine 16+ Years old  Discontinued   Zoster Vaccines- Shingrix  Discontinued   Last colonoscopy: Dr Kinnie Scales, 12/24/2019 due 2031  Influenza: Declines  Pneumococcal: 2014 Prevnar13: Declines Zostavax: 02/2013, declines shingrix  Names of Other Physician/Practitioners you currently use: 1. [facility] here for primary care 2. Eye Exam, Dr. Randon Goldsmith,  Diabetic 01/22/2023,  retinopathy  3. Dental Exam, Dr. Cain Saupe, last in 03/2023 4. Derm, Dr. Amy Swaziland, last 2023, R forearm SCC  Patient Care Team: Lucky Cowboy, MD as PCP - General (Internal Medicine) Tisovec, Adelfa Koh, MD (Internal Medicine)  Surgical: He  has a past surgical history that includes Knee arthroscopy; Pilonidal cyst excision; Colonoscopy; and Total knee arthroplasty (Right, 10/21/2017). Family His family history includes CAD (age of onset: 79) in his brother; COPD in his brother; Heart attack (age of onset: 66) in his father; Lung cancer in his sister; Stroke in his brother. Social history  He reports that he has never smoked. He has never used smokeless tobacco. He reports current alcohol use of about 1.0 standard drink of alcohol per week. He reports that he does not use drugs.  MEDICARE WELLNESS OBJECTIVES: Physical activity:  Current Exercise Habits: Home exercise routine Cardiac risk factors:   Depression/mood screen:      07/08/2023    3:29 PM  Depression screen PHQ 2/9  Decreased Interest 0  Down, Depressed, Hopeless 0  PHQ - 2 Score 0    ADLs:     07/08/2023    3:29 PM 09/05/2022   12:04 AM  In your present state of health, do you have any difficulty performing the following activities:  Hearing? 0 0  Vision? 0 0  Difficulty concentrating or making decisions? 0 0  Walking or climbing stairs? 0 0  Dressing or bathing? 0 0  Doing errands, shopping? 0 0     Cognitive Testing  Alert? Yes  Normal Appearance?Yes  Oriented to person? Yes  Place? Yes   Time? Yes  Recall of three objects?  Yes  Can perform simple calculations? Yes  Displays appropriate judgment?Yes  Can read the correct time from a watch face?Yes  EOL planning: Does Patient Have a Medical Advance Directive?: Yes Type of  Advance Directive: Living will     Review of Systems  Constitutional:  Negative for malaise/fatigue and weight loss.  HENT:  Negative for hearing loss and tinnitus.   Eyes:  Negative for blurred vision and double vision.  Respiratory:  Negative for cough, sputum production, shortness of breath and wheezing.   Cardiovascular:  Negative for chest pain, palpitations, orthopnea, claudication, leg swelling and PND.  Gastrointestinal:  Negative for abdominal pain, blood in stool, constipation, diarrhea, heartburn, melena, nausea and vomiting.  Genitourinary: Negative.   Musculoskeletal:  Positive for back pain (chronic lumbar). Negative for falls, joint pain and myalgias.  Skin:  Negative for rash.  Neurological:  Positive for tingling (bil fee). Negative for dizziness, sensory change, weakness and headaches.  Endo/Heme/Allergies:  Negative for polydipsia.  Psychiatric/Behavioral: Negative.  Negative for depression, memory loss, substance abuse and suicidal ideas. The patient is not nervous/anxious and does not have  insomnia.   All other systems reviewed and are negative.    Objective:   Today's Vitals   07/08/23 1433  BP: 118/62  Pulse: 60  Temp: 97.9 F (36.6 C)  SpO2: 99%  Weight: 236 lb 9.6 oz (107.3 kg)  Height: 5\' 11"  (1.803 m)    Body mass index is 33 kg/m.  General appearance: alert, no distress, WD/WN, male HEENT: normocephalic, sclerae anicteric, TMs pearly, nares patent, no discharge or erythema, pharynx normal Oral cavity: MMM, no lesions Neck: supple, no lymphadenopathy, no thyromegaly, no masses Heart: RRR, normal S1, S2, no murmurs Lungs: CTA bilaterally, no wheezes, rhonchi, or rales Abdomen: +bs, soft, obese, non tender, non distended, no masses, no hepatomegaly, no splenomegaly. Large ventral hernia with bearing down.  Musculoskeletal: nontender, no swelling, no obvious deformity Extremities: no edema, no cyanosis, no clubbing Pulses: 2+ symmetric, upper and lower extremities, normal cap refill Neurological: alert, oriented x 3, CN2-12 intact, strength normal upper extremities and lower extremities, sensation diminished bil feet, DTRs 2+ throughout, no cerebellar signs, gait steady. Sensation severely diminished bil feet to monofilament and touch.  Psychiatric: normal affect, behavior normal, pleasant      Medicare Attestation I have personally reviewed: The patient's medical and social history Their use of alcohol, tobacco or illicit drugs Their current medications and supplements The patient's functional ability including ADLs,fall risks, home safety risks, cognitive, and hearing and visual impairment Diet and physical activities Evidence for depression or mood disorders  The patient's weight, height, BMI, and visual acuity have been recorded in the chart.  I have made referrals, counseling, and provided education to the patient based on review of the above and I have provided the patient with a written personalized care plan for preventive services.     Adela Glimpse, NP   07/08/2023

## 2023-07-09 ENCOUNTER — Encounter: Payer: Self-pay | Admitting: Nurse Practitioner

## 2023-07-09 ENCOUNTER — Other Ambulatory Visit: Payer: Self-pay | Admitting: Nurse Practitioner

## 2023-07-12 ENCOUNTER — Other Ambulatory Visit (HOSPITAL_COMMUNITY): Payer: Self-pay

## 2023-07-12 MED ORDER — GLIMEPIRIDE 4 MG PO TABS
4.0000 mg | ORAL_TABLET | Freq: Every day | ORAL | 2 refills | Status: DC
  Filled 2023-07-12: qty 90, 90d supply, fill #0
  Filled 2023-09-26: qty 90, 90d supply, fill #1
  Filled 2024-01-04: qty 90, 90d supply, fill #2

## 2023-07-16 ENCOUNTER — Other Ambulatory Visit: Payer: PPO

## 2023-07-16 ENCOUNTER — Other Ambulatory Visit: Payer: Self-pay | Admitting: Nurse Practitioner

## 2023-07-16 ENCOUNTER — Ambulatory Visit (INDEPENDENT_AMBULATORY_CARE_PROVIDER_SITE_OTHER): Payer: PPO

## 2023-07-16 DIAGNOSIS — E875 Hyperkalemia: Secondary | ICD-10-CM

## 2023-07-16 NOTE — Progress Notes (Signed)
Patient presents to the office for a nurse visit to have labs done to recheck Potassium levels.   

## 2023-07-17 ENCOUNTER — Other Ambulatory Visit: Payer: Self-pay | Admitting: Nurse Practitioner

## 2023-07-17 DIAGNOSIS — E875 Hyperkalemia: Secondary | ICD-10-CM

## 2023-07-19 ENCOUNTER — Other Ambulatory Visit: Payer: Self-pay

## 2023-07-19 ENCOUNTER — Other Ambulatory Visit: Payer: PPO

## 2023-07-19 DIAGNOSIS — E875 Hyperkalemia: Secondary | ICD-10-CM | POA: Diagnosis not present

## 2023-07-25 ENCOUNTER — Other Ambulatory Visit (HOSPITAL_COMMUNITY): Payer: Self-pay

## 2023-08-13 ENCOUNTER — Other Ambulatory Visit (HOSPITAL_COMMUNITY): Payer: Self-pay

## 2023-09-26 ENCOUNTER — Other Ambulatory Visit (HOSPITAL_COMMUNITY): Payer: Self-pay

## 2023-10-01 ENCOUNTER — Other Ambulatory Visit (HOSPITAL_COMMUNITY): Payer: Self-pay

## 2023-10-22 ENCOUNTER — Other Ambulatory Visit: Payer: Self-pay | Admitting: Nurse Practitioner

## 2023-10-23 ENCOUNTER — Other Ambulatory Visit: Payer: Self-pay

## 2023-10-23 ENCOUNTER — Other Ambulatory Visit (HOSPITAL_COMMUNITY): Payer: Self-pay

## 2023-10-23 MED ORDER — OLMESARTAN MEDOXOMIL 20 MG PO TABS
20.0000 mg | ORAL_TABLET | Freq: Every day | ORAL | 2 refills | Status: DC
  Filled 2023-10-23: qty 90, 90d supply, fill #0
  Filled 2024-01-20: qty 90, 90d supply, fill #1
  Filled 2024-05-01: qty 90, 90d supply, fill #2

## 2023-10-24 ENCOUNTER — Other Ambulatory Visit (HOSPITAL_COMMUNITY): Payer: Self-pay

## 2023-11-06 ENCOUNTER — Other Ambulatory Visit (HOSPITAL_COMMUNITY): Payer: Self-pay

## 2023-12-16 ENCOUNTER — Encounter: Payer: Self-pay | Admitting: Internal Medicine

## 2023-12-16 NOTE — Progress Notes (Signed)
 [facility]      ADULT   &   ADOLESCENT      INTERNAL MEDICINE  Elsie Richards, M.D.          Lonell Rous, ANP        Bascom Necessary, FNP  [facility] [address]  [place], [place]. [address] Telephone [phone] Telefax [phone]   Future Appointments  Date Time Provider Department  12/17/2023                              9 mo ov   2:30 PM Richards Elsie, MD [facility]  03/16/2024                            cpe  2:00 PM Necessary Bascom, NP [facility]  07/07/2024                            wellness  2:30 PM Necessary Bascom, NP [facility]    History of Present Illness:      This very nice 78 y.o. MWM presents for 6  month follow up with HTN, HLD, T2_NIDDM and Vitamin D  Deficiency. CT scan in 2018 revealed Aortic Atherosclerosis. Patient is on CPAP for OSA with improved restorative sleep        Patient is treated for HTN  (1995) & BP has been controlled at home. Today's BP is at goal -  128/70 . Patient has had no complaints of any cardiac type chest pain, palpitations, dyspnea chet /PND, dizziness, claudication or dependent edema.       Hyperlipidemia is controlled with diet & Rosuvastatin  . Patient denies myalgias or other med SE's. 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   Last A1c was not at goal :  Lab Results  Component Value Date   HGBA1C 7.2 (H) 07/08/2023   Wt Readings from Last 3 Encounters:  12/17/23 242 lb 6.4 oz (110 kg)  07/16/23 235 lb 9.6 oz (106.9 kg)  07/08/23 236 lb 9.6 oz (107.3 kg)                                                      Further, the patient also has history of Vitamin D  Deficiency   (29 /2019) and supplements vitamin D  without any suspected side-effects. Last vitamin D  was elevated & dose was decreased :  Lab Results  Component Value Date   VD25OH 121 (H) 07/08/2023        Current Outpatient Medications  Medication Instructions   APPLE CIDER VINEGAR  Oral, Daily   VITAMIN C ) Oral, 1 tablet daily    aspirin  81 MG EC tablet Take 1 tablet daily   cetirizine (ZYRTEC) 10 mg, Oral, Daily   CHOLECALCIFEROL  10,000 Units Daily   B-12) 500 MCG SL 1 tablet,  Sublingual, Daily   gabapentin  (NEURONTIN ) 100 MG capsule Take 1 capsule  3 times daily for chronic pain   ipratropium (ATROVENT ) 0.06 % nasal spray 1-2 sprays each  nostril  2-3 x/day as needed    Magnesium 400 MG CAPS 1 capsule, Oral, 2 times daily   metFORMIN  -XR 500 MG 24 hr tablet Take 2 tablets  twice daily with a meal .   olmesartan  (BENICAR ) 40 mg, Oral, Daily   Omega-3 Fatty Acids (FISH OIL PO) Oral, 2 times daily   rosuvastatin  (CRESTOR ) 10 MG tablet Take 1 tablet nightly    Semaglutide ,0.25 or 0.5MG /DOS, (OZEMPIC , 0.25 OR 0.5 MG/DOSE,) 2 MG/3ML SOPN Inject 0.25 mg into skin once weekly; if doing well increase in 4 weeks to 0.5 mg weekly.   Zinc 50 MG TABS Oral, Daily     Allergies  Allergen Reactions   No Known Allergies      PMHx:   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   Dyspnea    Erectile dysfunction    GERD (gastroesophageal reflux disease)    Headache    Heart murmur    History of kidney stones    Hypercholesteremia    Hyperlipemia    Hypertension    Kidney stone    Low back pain    Obesity    Rotator cuff dysfunction    partial tear , left shoulder   Sleep apnea      Immunization History  Administered Date(s) Administered   PFIZER Covid-19 Tri-Sucrose Vacc  07/19/2021   PFIZER SARS-COV-2 Vacci   01/01/2020, 01/22/2020, 09/07/2020   Pneumococcal-23 05/05/2009, 03/03/2013   Td 05/05/2009   Tdap 06/21/2015   Zoster, Live 03/03/2013     Past Surgical History:  Procedure Laterality Date   COLONOSCOPY     KNEE ARTHROSCOPY     PILONIDAL CYST EXCISION     from base of spine   RT TOTAL KNEE ARTHROPLASTY;  Melodi Lerner, MD;  10/21/2017     FHx:    Reviewed / unchanged  SHx:    Reviewed / unchanged   Systems Review:  Constitutional: Denies fever, chills, wt changes, headaches, insomnia, fatigue, night sweats, change in appetite. Eyes: Denies redness, blurred vision, diplopia, discharge, itchy, watery eyes.  ENT: Denies discharge, congestion, post nasal drip, epistaxis, sore throat, earache, hearing loss, dental pain, tinnitus, vertigo, sinus pain, snoring.  CV: Denies chest pain, palpitations, irregular heartbeat, syncope, dyspnea, diaphoresis, orthopnea, PND, claudication or edema. Respiratory: denies cough, dyspnea, DOE, pleurisy, hoarseness, laryngitis, wheezing.  Gastrointestinal: Denies dysphagia, odynophagia, heartburn, reflux, water brash, abdominal pain or cramps, nausea, vomiting, bloating, diarrhea, constipation, hematemesis, melena, hematochezia  or hemorrhoids. Genitourinary: Denies dysuria, frequency, urgency, nocturia, hesitancy, discharge, hematuria or flank pain. Musculoskeletal: Denies arthralgias, myalgias, stiffness, jt. swelling, pain, limping or strain/sprain.  Skin: Denies pruritus, rash, hives, warts, acne, eczema or change in skin lesion(s). Neuro: No weakness, tremor, incoordination, spasms, paresthesia or pain. Psychiatric: Denies confusion, memory loss or sensory loss. Endo: Denies change in weight, skin or hair change.  Heme/Lymph: No excessive bleeding, bruising or enlarged lymph nodes.  Physical Exam  BP 128/70   Pulse 84   Temp 97.9 F (36.6 C)   Resp 16   Ht 5' 11 (1.803 m)   Wt 242 lb  6.4 oz (110 kg)   SpO2 98%   BMI 33.81 kg/m    Appears over nourished, well groomed  and in no distress.  Eyes: PERRLA, EOMs, conjunctiva no swelling or erythema. Sinuses: No frontal/maxillary tenderness ENT/Mouth: EAC's clear, TM's nl w/o erythema, bulging. Nares clear w/o erythema, swelling, exudates. Oropharynx clear without erythema or exudates. Oral hygiene is good. Tongue normal, non obstructing. Hearing intact.  Neck: Supple. Thyroid  not palpable. Car 2+/2+ without bruits, nodes or JVD. Chest: Respirations nl with BS clear & equal w/o rales, rhonchi, wheezing or stridor.  Cor: Heart sounds normal w/ regular rate and rhythm without sig. murmurs, gallops, clicks or rubs. Peripheral pulses normal and equal  without edema.  Abdomen: Soft & bowel sounds normal. Non-tender w/o guarding, rebound, hernias, masses or organomegaly.  Lymphatics: Unremarkable.  Musculoskeletal: Full ROM all peripheral extremities, joint stability, 5/5 strength and normal gait.  Skin: Warm, dry without exposed rashes, lesions or ecchymosis apparent.  Neuro: Cranial nerves intact, reflexes equal bilaterally. Sensory-motor testing grossly intact. Tendon reflexes grossly intact.  Pysch: Alert & oriented x 3.  Insight and judgement nl & appropriate. No ideations.  Assessment and Plan:  1. Severe obesity with body mass index (BMI) of 35.0 to 39.9 with comorbidity (HCC)   2. Essential hypertension  - Continue medication, monitor blood pressure at home.  - Continue DASH diet.  Reminder to go to the ER if any CP,  SOB, nausea, dizziness, severe HA, changes vision/speech.    - CBC with Differential/Platelet - COMPLETE METABOLIC PANEL WITH GFR - Magnesium - TSH  3. Hyperlipidemia associated with type 2 diabetes mellitus (HCC)  - Continue diet/meds, exercise,& lifestyle modifications.  - Continue monitor periodic cholesterol/liver & renal functions     - Lipid panel - TSH  4. Type 2 diabetes mellitus with stage 2 chronic kidney  disease, without  long-term current use of insulin  (HCC)  - Continue diet, exercise  - Lifestyle modifications.  - Monitor appropriate labs   - Hemoglobin A1c - Insulin , random  5. Vitamin D  deficiency  - Continue supplementation.   VITAMIN D  25 Hydroxy   6. Aortic atherosclerosis (HCC) by CT scan 2018  - Lipid panel  7. OSA on CPAP   8. Medication management  - CBC with Differential/Platelet - COMPLETE METABOLIC PANEL WITH GFR - Magnesium - Lipid panel - TSH - Hemoglobin A1c - Insulin , random - VITAMIN D  25 Hydroxy         Discussed  regular exercise, BP monitoring, weight control to achieve/maintain BMI less than 25 and discussed med and SE's. Recommended labs to assess and monitor clinical status with further disposition pending results of labs.  I discussed the assessment and treatment plan with the patient. The patient was provided an opportunity to ask questions and all were answered. The patient agreed with the plan and demonstrated an understanding of the instructions.  I provided over 30 minutes of exam, counseling, chart review and  complex critical decision making.        The patient was advised to call back or seek an in-person evaluation if the symptoms worsen or if the condition fails to improve as anticipated.   Elsie JONETTA Richards, MD

## 2023-12-16 NOTE — Patient Instructions (Signed)

## 2023-12-17 ENCOUNTER — Ambulatory Visit (INDEPENDENT_AMBULATORY_CARE_PROVIDER_SITE_OTHER): Payer: PPO | Admitting: Internal Medicine

## 2023-12-17 DIAGNOSIS — E559 Vitamin D deficiency, unspecified: Secondary | ICD-10-CM

## 2023-12-17 DIAGNOSIS — G4733 Obstructive sleep apnea (adult) (pediatric): Secondary | ICD-10-CM | POA: Diagnosis not present

## 2023-12-17 DIAGNOSIS — I10 Essential (primary) hypertension: Secondary | ICD-10-CM

## 2023-12-17 DIAGNOSIS — E1169 Type 2 diabetes mellitus with other specified complication: Secondary | ICD-10-CM

## 2023-12-17 DIAGNOSIS — I700 Atherosclerosis of aorta: Secondary | ICD-10-CM

## 2023-12-17 DIAGNOSIS — E785 Hyperlipidemia, unspecified: Secondary | ICD-10-CM

## 2023-12-17 DIAGNOSIS — E1122 Type 2 diabetes mellitus with diabetic chronic kidney disease: Secondary | ICD-10-CM

## 2023-12-17 DIAGNOSIS — Z79899 Other long term (current) drug therapy: Secondary | ICD-10-CM

## 2023-12-17 DIAGNOSIS — N182 Chronic kidney disease, stage 2 (mild): Secondary | ICD-10-CM | POA: Diagnosis not present

## 2023-12-17 MED ORDER — IPRATROPIUM BROMIDE 0.06 % NA SOLN
NASAL | 3 refills | Status: AC
  Filled 2023-12-17: qty 45, 90d supply, fill #0

## 2023-12-18 ENCOUNTER — Other Ambulatory Visit (HOSPITAL_COMMUNITY): Payer: Self-pay

## 2023-12-18 ENCOUNTER — Other Ambulatory Visit: Payer: Self-pay

## 2023-12-18 LAB — HEMOGLOBIN A1C
Hgb A1c MFr Bld: 6.2 %{Hb} — ABNORMAL HIGH (ref <5.7)
Mean Plasma Glucose: 131 mg/dL
eAG (mmol/L): 7.3 mmol/L

## 2023-12-18 LAB — COMPLETE METABOLIC PANEL WITH GFR
AG Ratio: 1.5 (calc) (ref 1.0–2.5)
ALT: 29 U/L (ref 9–46)
AST: 23 U/L (ref 10–35)
Albumin: 4.4 g/dL (ref 3.6–5.1)
Alkaline phosphatase (APISO): 39 U/L (ref 35–144)
BUN/Creatinine Ratio: 20 (calc) (ref 6–22)
BUN: 27 mg/dL — ABNORMAL HIGH (ref 7–25)
CO2: 27 mmol/L (ref 20–32)
Calcium: 9.7 mg/dL (ref 8.6–10.3)
Chloride: 104 mmol/L (ref 98–110)
Creat: 1.37 mg/dL — ABNORMAL HIGH (ref 0.70–1.28)
Globulin: 2.9 g/dL (ref 1.9–3.7)
Glucose, Bld: 50 mg/dL — ABNORMAL LOW (ref 65–99)
Potassium: 5.4 mmol/L — ABNORMAL HIGH (ref 3.5–5.3)
Sodium: 139 mmol/L (ref 135–146)
Total Bilirubin: 0.5 mg/dL (ref 0.2–1.2)
Total Protein: 7.3 g/dL (ref 6.1–8.1)
eGFR: 53 mL/min/{1.73_m2} — ABNORMAL LOW (ref >=60)

## 2023-12-18 LAB — CBC WITH DIFFERENTIAL/PLATELET
Absolute Lymphocytes: 2464 {cells}/uL (ref 850–3900)
Absolute Monocytes: 608 {cells}/uL (ref 200–950)
Basophils Absolute: 56 {cells}/uL (ref 0–200)
Basophils Relative: 0.7 %
Eosinophils Absolute: 176 {cells}/uL (ref 15–500)
Eosinophils Relative: 2.2 %
HCT: 45.4 % (ref 38.5–50.0)
Hemoglobin: 14.9 g/dL (ref 13.2–17.1)
MCH: 29.7 pg (ref 27.0–33.0)
MCHC: 32.8 g/dL (ref 32.0–36.0)
MCV: 90.4 fL (ref 80.0–100.0)
MPV: 10.4 fL (ref 7.5–12.5)
Monocytes Relative: 7.6 %
Neutro Abs: 4696 {cells}/uL (ref 1500–7800)
Neutrophils Relative %: 58.7 %
Platelets: 283 10*3/uL (ref 140–400)
RBC: 5.02 10*6/uL (ref 4.20–5.80)
RDW: 12.2 % (ref 11.0–15.0)
Total Lymphocyte: 30.8 %
WBC: 8 10*3/uL (ref 3.8–10.8)

## 2023-12-18 LAB — LIPID PANEL
Cholesterol: 136 mg/dL (ref <200)
HDL: 34 mg/dL — ABNORMAL LOW (ref >=40)
LDL Cholesterol (Calc): 75 mg/dL
Non-HDL Cholesterol (Calc): 102 mg/dL (ref <130)
Total CHOL/HDL Ratio: 4 (calc) (ref <5.0)
Triglycerides: 176 mg/dL — ABNORMAL HIGH (ref <150)

## 2023-12-18 LAB — TSH: TSH: 2.47 m[IU]/L (ref 0.40–4.50)

## 2023-12-18 LAB — INSULIN, RANDOM: Insulin: 42.9 u[IU]/mL — ABNORMAL HIGH

## 2023-12-18 LAB — VITAMIN D 25 HYDROXY (VIT D DEFICIENCY, FRACTURES): Vit D, 25-Hydroxy: 79 ng/mL (ref 30–100)

## 2023-12-18 LAB — MAGNESIUM: Magnesium: 2.5 mg/dL (ref 1.5–2.5)

## 2023-12-18 NOTE — Progress Notes (Signed)
-   Test results slightly outside the reference range are not unusual. If there is anything important, I will review this with you,  otherwise it is considered normal test values.  If you have further questions,  please do not hesitate to contact me at the office or via My Chart.   =========================================================================  -  A1c = 6.2% - Better  ( was 7.2% )    but still too high                                                                                             ( Ideal or goal is less than 5.7% )   =========================================================================  -  Potassium is slightly or borderline elevated,  So    - Avoid      Salt Substitutes which are usually KCL =  potassium chloride   - Foods to avoid -  high in Potassium are Lima beans/Kidney beans, prunes, Raisins, Avocados,   Watermelon, Sunflower seeds/kernals, cooked spinach,acorn squash, crushed tomatoes,   Mushrooms, cooked Salmon and of course Orange Juice (Avoid Bananas)   =========================================================================  -  Chol =  136    &   LDL  =  75   -   Both  Excellent   - Very low risk for Heart Attack  / Stroke  =========================================================================  -  Vitamin D    = 79   -   Great  - Please keep dosage same    =========================================================================  -  All Else - CBC - Kidneys - Electrolytes - Liver - Magnesium & Thyroid     - all  Normal / OK  ===========================================================

## 2023-12-23 ENCOUNTER — Encounter: Payer: Self-pay | Admitting: Internal Medicine

## 2023-12-23 ENCOUNTER — Other Ambulatory Visit: Payer: Self-pay | Admitting: Internal Medicine

## 2023-12-23 ENCOUNTER — Other Ambulatory Visit (HOSPITAL_COMMUNITY): Payer: Self-pay

## 2023-12-23 DIAGNOSIS — E1122 Type 2 diabetes mellitus with diabetic chronic kidney disease: Secondary | ICD-10-CM

## 2023-12-23 MED ORDER — TIRZEPATIDE 2.5 MG/0.5ML ~~LOC~~ SOAJ
SUBCUTANEOUS | 0 refills | Status: DC
  Filled 2023-12-23: qty 2, 28d supply, fill #0

## 2023-12-24 ENCOUNTER — Other Ambulatory Visit: Payer: Self-pay

## 2023-12-26 ENCOUNTER — Telehealth: Payer: Self-pay

## 2023-12-26 ENCOUNTER — Encounter (HOSPITAL_BASED_OUTPATIENT_CLINIC_OR_DEPARTMENT_OTHER): Payer: Self-pay

## 2023-12-26 ENCOUNTER — Emergency Department (HOSPITAL_BASED_OUTPATIENT_CLINIC_OR_DEPARTMENT_OTHER)
Admission: EM | Admit: 2023-12-26 | Discharge: 2023-12-26 | Disposition: A | Discharge status: Home / Self Care | Payer: PPO | Attending: Emergency Medicine | Admitting: Emergency Medicine

## 2023-12-26 ENCOUNTER — Emergency Department (HOSPITAL_BASED_OUTPATIENT_CLINIC_OR_DEPARTMENT_OTHER): Payer: PPO

## 2023-12-26 ENCOUNTER — Ambulatory Visit: Payer: Self-pay

## 2023-12-26 ENCOUNTER — Other Ambulatory Visit: Payer: Self-pay

## 2023-12-26 DIAGNOSIS — N189 Chronic kidney disease, unspecified: Secondary | ICD-10-CM | POA: Insufficient documentation

## 2023-12-26 DIAGNOSIS — I129 Hypertensive chronic kidney disease with stage 1 through stage 4 chronic kidney disease, or unspecified chronic kidney disease: Secondary | ICD-10-CM | POA: Diagnosis not present

## 2023-12-26 DIAGNOSIS — Z79899 Other long term (current) drug therapy: Secondary | ICD-10-CM | POA: Diagnosis not present

## 2023-12-26 DIAGNOSIS — E1122 Type 2 diabetes mellitus with diabetic chronic kidney disease: Secondary | ICD-10-CM | POA: Insufficient documentation

## 2023-12-26 DIAGNOSIS — Z7984 Long term (current) use of oral hypoglycemic drugs: Secondary | ICD-10-CM | POA: Diagnosis not present

## 2023-12-26 DIAGNOSIS — W19XXXA Unspecified fall, initial encounter: Secondary | ICD-10-CM

## 2023-12-26 DIAGNOSIS — M542 Cervicalgia: Secondary | ICD-10-CM | POA: Diagnosis not present

## 2023-12-26 DIAGNOSIS — W01198A Fall on same level from slipping, tripping and stumbling with subsequent striking against other object, initial encounter: Secondary | ICD-10-CM | POA: Insufficient documentation

## 2023-12-26 DIAGNOSIS — S0003XA Contusion of scalp, initial encounter: Secondary | ICD-10-CM | POA: Diagnosis not present

## 2023-12-26 DIAGNOSIS — S0990XA Unspecified injury of head, initial encounter: Secondary | ICD-10-CM | POA: Diagnosis present

## 2023-12-26 DIAGNOSIS — R030 Elevated blood-pressure reading, without diagnosis of hypertension: Secondary | ICD-10-CM

## 2023-12-26 NOTE — ED Notes (Signed)
Patient transported to CT 

## 2023-12-26 NOTE — ED Provider Notes (Signed)
[place] [facility] Provider Note   CSN: [id number] Arrival date & time: 12/26/23  1456     History  Chief Complaint  Patient presents with   Joshua Koch is a 78 y.o. male.  Patient with history of diabetes, high blood pressure, chronic kidney disease, aspirin use, no other anticoagulation --presents to the emergency department today for evaluation of head injury.  Patient had a fall and struck the back of his head on cement blocks about 11:30 AM today.  No loss of consciousness however he did have significant pain, nausea and, was pale per family member.  He then recovered.  Currently has no significant headache.  He does have some neck pain when he turns his neck to the greatest extent.  He denies any weakness, numbness, or tingling in the arms of the legs.  No subsequent vomiting or confusion.  Fire department was called and states that his blood pressure was a little bit high with them.       Home Medications Prior to Admission medications   Medication Sig Start Date End Date Taking? Authorizing Provider  Ascorbic Acid (VITAMIN C PO) Take by mouth. 1 tablet daily    [provider]  aspirin (ECOTRIN LOW STRENGTH) 81 MG EC tablet Take 1 tablet daily 09/06/18   Lucky Cowboy, MD  cetirizine (ZYRTEC) 10 MG tablet Take 10 mg by mouth daily.    [provider]  CHOLECALCIFEROL PO Take 10,000 Units by mouth daily.    [provider]  Cyanocobalamin (B-12) 500 MCG SUBL Place 1 tablet under the tongue daily.    [provider]  glimepiride (AMARYL) 4 MG tablet Take 1 tablet (4 mg total) by mouth daily with breakfast. 07/12/23   Adela Glimpse, NP  ipratropium (ATROVENT) 0.06 % nasal spray Use 1 to 2 sprays each nostril 2 to 3 times day as needed 12/17/23   Lucky Cowboy, MD  Magnesium 400 MG CAPS Take 1 capsule by mouth 2 (two) times daily.    [provider]  metFORMIN (GLUCOPHAGE-XR) 500 MG  24 hr tablet Take 2 tablets (1,000 mg total) by mouth 2 (two) times daily with a meal for diabetes. 05/06/23   Lucky Cowboy, MD  olmesartan (BENICAR) 20 MG tablet Take 1 tablet (20 mg total) by mouth daily. 10/23/23   Raynelle Dick, NP  Omega-3 Fatty Acids (FISH OIL PO) Take by mouth 2 (two) times daily.    [provider]  rosuvastatin (CRESTOR) 10 MG tablet Take 1 tablet (10 mg total) by mouth daily for cholesterol 06/24/23   Lucky Cowboy, MD  tirzepatide [facility]) 2.5 MG/0.5ML Pen Inject 1 pen (2.5 mg) into Skin every 7 days for Diabetes  ( E11.29) 12/23/23   Lucky Cowboy, MD  Zinc 50 MG TABS Take by mouth daily.    [provider]      Allergies    No known allergies    Review of Systems   Review of Systems  Physical Exam Updated Vital Signs BP (!) 192/59 (BP Location: Left Arm)   Pulse 75   Temp 98.2 F (36.8 C)   Resp 18   Ht 6' (1.829 m)   Wt 108.9 kg   SpO2 100%   BMI 32.55 kg/m  Physical Exam Vitals and nursing note reviewed.  Constitutional:      Appearance: He is well-developed.  HENT:     Head: Normocephalic. No raccoon eyes or Battle's  sign.     Comments: Very slight abrasion to the right occiput with mild swelling.    Right Ear: Tympanic membrane, ear canal and external ear normal. No hemotympanum.     Left Ear: Tympanic membrane, ear canal and external ear normal. No hemotympanum.     Nose: Nose normal.  Eyes:     General: Lids are normal.     Conjunctiva/sclera: Conjunctivae normal.     Pupils: Pupils are equal, round, and reactive to light.     Comments: No visible hyphema  Cardiovascular:     Rate and Rhythm: Normal rate and regular rhythm.  Pulmonary:     Effort: Pulmonary effort is normal.     Breath sounds: Normal breath sounds.  Abdominal:     Palpations: Abdomen is soft.     Tenderness: There is no abdominal tenderness.  Musculoskeletal:        General: Normal range of motion.     Cervical back: Normal range of  motion and neck supple. No tenderness or bony tenderness.     Thoracic back: No tenderness or bony tenderness.     Lumbar back: No tenderness or bony tenderness.     Comments: Full range of motion of cervical spine. No midline tenderness.   Skin:    General: Skin is warm and dry.  Neurological:     Mental Status: He is alert and oriented to person, place, and time.     GCS: GCS eye subscore is 4. GCS verbal subscore is 5. GCS motor subscore is 6.     Cranial Nerves: No cranial nerve deficit.     Sensory: No sensory deficit.     Coordination: Coordination normal.     ED Results / Procedures / Treatments   Labs (all labs ordered are listed, but only abnormal results are displayed) Labs Reviewed - No data to display  EKG None  Radiology No results found.  Procedures Procedures    Medications Ordered in ED Medications - No data to display  ED Course/ Medical Decision Making/ A&P    Patient seen and examined. History obtained directly from patient and family member.   Labs/EKG: None ordered  Imaging: Ordered CT head, CT cervical spine.  Medications/Fluids: None ordered  Most recent vital signs reviewed and are as follows: BP (!) 192/59 (BP Location: Left Arm)   Pulse 75   Temp 98.2 F (36.8 C)   Resp 18   Ht 6' (1.829 m)   Wt 108.9 kg   SpO2 100%   BMI 32.55 kg/m   Initial impression: Minor head injury, scalp contusion  5:16 PM Reassessment performed. Patient appears stable, no decompensation.  Imaging personally visualized and interpreted including: CT head, CT cervical spine agree negative.  Arthritic changes noted in cervical spine.  Reviewed pertinent lab work and imaging with patient at bedside. Questions answered.   Most current vital signs reviewed and are as follows: BP (!) 192/59 (BP Location: Left Arm)   Pulse 75   Temp 98.2 F (36.8 C)   Resp 18   Ht 6' (1.829 m)   Wt 108.9 kg   SpO2 100%   BMI 32.55 kg/m   Plan: Discharge to home.    Prescriptions written for: None  Other home care instructions discussed: We discussed concussion symptoms and need to follow-up with these occur.  ED return instructions discussed: Patient counseled to return if they have weakness in their arms or legs, slurred speech, trouble walking or talking, confusion, trouble with  their balance, or if they have any other concerns. Patient verbalizes understanding and agrees with plan.   Follow-up instructions discussed: Patient encouraged to follow-up with their PCP in 3 days with any persistent symptoms..                                  Medical Decision Making Amount and/or Complexity of Data Reviewed Radiology: ordered.   Patient with head injury, mechanical sounding fall.  No loss of consciousness.  No anticoagulation.  Due to age, imaging of the head and neck were performed.  These were reassuring.  Patient without decompensation during ED stay.  Overall no significant symptoms concerning for concussion.  Outpatient follow-up discussed.  Did have elevated blood pressure readings here.  Patient will monitor and follow-up with PCP if these persist.  No real concerns for endorgan damage at this time.        Final Clinical Impression(s) / ED Diagnoses Final diagnoses:  Contusion of scalp, initial encounter  Fall, initial encounter  Elevated blood pressure reading    Rx / DC Orders ED Discharge Orders     None         Renne Crigler, PA-C 12/26/23 1719    Terrilee Files, MD 12/27/23 [phone]

## 2023-12-26 NOTE — Telephone Encounter (Signed)
The patient's wife called to report that the patient had hit his head with a visible knot. He had been evaluated by EMS who advised the patient get a head CT. The patient's wife was advised that the patient should go to his nearest Emergency Room for evaluation and imaging of his head injury to insure that he does not have any brain bleeding or other injury. The patient's wife asked if we could call in a head CT and I explained that we were unable to do that without evaluating the patient. I also explained that he needed to have this head CT as soon as possible. The patient's wife voiced clear understanding to this staff member.

## 2023-12-26 NOTE — Discharge Instructions (Signed)
Please read and follow all provided instructions.  Your diagnoses today include:  1. Contusion of scalp, initial encounter   2. Fall, initial encounter   3. Elevated blood pressure reading     Tests performed today include: CT scan of your head and cervical spine that did not show any serious injury. Vital signs. See below for your results today.   Medications prescribed:  None  Take any prescribed medications only as directed.  Home care instructions:  Follow any educational materials contained in this packet.  BE VERY CAREFUL not to take multiple medicines containing Tylenol (also called acetaminophen). Doing so can lead to an overdose which can damage your liver and cause liver failure and possibly death.   Follow-up instructions: Please follow-up with your primary care provider as needed for further evaluation of your symptoms.   Return instructions:  SEEK IMMEDIATE MEDICAL ATTENTION IF: There is confusion or drowsiness (although children frequently become drowsy after injury).  You cannot awaken the injured person.  You have more than one episode of vomiting.  You notice dizziness or unsteadiness which is getting worse, or inability to walk.  You have convulsions or unconsciousness.  You experience severe, persistent headaches not relieved by Tylenol. You cannot use arms or legs normally.  There are changes in pupil sizes. (This is the black center in the colored part of the eye)  There is clear or bloody discharge from the nose or ears.  You have change in speech, vision, swallowing, or understanding.  Localized weakness, numbness, tingling, or change in bowel or bladder control. You have any other emergent concerns.  Additional Information: You have had a head injury which does not appear to require admission at this time.  Your vital signs today were: BP (!) 192/59 (BP Location: Left Arm)   Pulse 75   Temp 98.2 F (36.8 C)   Resp 18   Ht 6' (1.829 m)   Wt  108.9 kg   SpO2 100%   BMI 32.55 kg/m  If your blood pressure (BP) was elevated above 135/85 this visit, please have this repeated by your doctor within one month. --------------

## 2023-12-26 NOTE — ED Triage Notes (Signed)
Pt fell down the stairs and hit head on a cinderblock wall. Denies LOC. Pt states that he fell straight back.

## 2023-12-26 NOTE — Telephone Encounter (Signed)
Chief Complaint: fell backwards from steps. Missed the last step Symptoms: nausea hit back pf head and back Frequency: today Pertinent Negatives: Patient denies LOC, bleeding Disposition: [x] ED /[] Urgent Care (no appt availability in office) / [] Appointment(In office/virtual)/ []  [facility]/ [] Home Care/ [] Refused Recommended Disposition /[] [facility]/ []  Follow-up with PCP Additional Notes: due to height of fall and hitting head plus nausea sent to ED Reason for Disposition . Dangerous injury (e.g., MVA, diving, trampoline, contact sports, fall > 10 feet or 3 meters) or severe blow from hard object (e.g., golf club or baseball bat)  Answer Assessment - Initial Assessment Questions 1. MECHANISM: "How did the injury happen?" For falls, ask: "What height did you fall from?" and "What surface did you fall against?"      Missed a step and fell backwards 2. ONSET: "When did the injury happen?" (Minutes or hours ago)      today 3. NEUROLOGIC SYMPTOMS: "Was there any loss of consciousness?" "Are there any other neurological symptoms?"      Nausea no LOC 4. MENTAL STATUS: "Does the person know who they are, who you are, and where they are?"      yes  6. SCALP APPEARANCE: "What does the scalp look like? Is it bleeding now?" If Yes, ask: "Is it difficult to stop?"      No bleeing 7. SIZE: For cuts, bruises, or swelling, ask: "How large is it?" (e.g., inches or centimeters)      noe 8. PAIN: "Is there any pain?" If Yes, ask: "How bad is it?"  (e.g., Scale 1-10; or mild, moderate, severe)     none 9. TETANUS: For any breaks in the skin, ask: "When was the last tetanus booster?"     N/a 10. OTHER SYMPTOMS: "Do you have any other symptoms?" (e.g., neck pain, vomiting)       nausea 11. PREGNANCY: "Is there any chance you are pregnant?" "When was your last menstrual period?"       N/a  Reason for Disposition . Dangerous injury (e.g., MVA, diving, trampoline, contact sports,  fall > 10 feet or 3 meters) or severe blow from hard object (e.g., golf club or baseball bat)  Answer Assessment - Initial Assessment Questions 1. MECHANISM: "How did the injury happen?" For falls, ask: "What height did you fall from?" and "What surface did you fall against?"      Missed a step and fell backwards 2. ONSET: "When did the injury happen?" (Minutes or hours ago)      today 3. NEUROLOGIC SYMPTOMS: "Was there any loss of consciousness?" "Are there any other neurological symptoms?"      Nausea no LOC 4. MENTAL STATUS: "Does the person know who they are, who you are, and where they are?"      yes  6. SCALP APPEARANCE: "What does the scalp look like? Is it bleeding now?" If Yes, ask: "Is it difficult to stop?"      No bleeing 7. SIZE: For cuts, bruises, or swelling, ask: "How large is it?" (e.g., inches or centimeters)      noe 8. PAIN: "Is there any pain?" If Yes, ask: "How bad is it?"  (e.g., Scale 1-10; or mild, moderate, severe)     none 9. TETANUS: For any breaks in the skin, ask: "When was the last tetanus booster?"     N/a 10. OTHER SYMPTOMS: "Do you have any other symptoms?" (e.g., neck pain, vomiting)       nausea 11.  PREGNANCY: "Is there any chance you are pregnant?" "When was your last menstrual period?"       N/a  Answer Assessment - Initial Assessment Questions 1. MECHANISM: "How did the fall happen?"     Walking backward down step from basement and missed the very last step and fell backwards 2. DOMESTIC VIOLENCE AND ELDER ABUSE SCREENING: "Did you fall because someone pushed you or tried to hurt you?" If Yes, ask: "Are you safe now?"     no 3. ONSET: "When did the fall happen?" (e.g., minutes, hours, or days ago)     Today  4. LOCATION: "What part of the body hit the ground?" (e.g., back, buttocks, head, hips, knees, hands, head, stomach)     Back and back of head 5. INJURY: "Did you hurt (injure) yourself when you fell?" If Yes, ask: "What did you injure?  Tell me more about this?" (e.g., body area; type of injury; pain severity)"     denies 6. PAIN: "Is there any pain?" If Yes, ask: "How bad is the pain?" (e.g., Scale 1-10; or mild,  moderate, severe)   - NONE (0): No pain   - MILD (1-3): Doesn't interfere with normal activities    - MODERATE (4-7): Interferes with normal activities or awakens from sleep    - SEVERE (8-10): Excruciating pain, unable to do any normal activities      none 7. SIZE: For cuts, bruises, or swelling, ask: "How large is it?" (e.g., inches or centimeters)      none 9. OTHER SYMPTOMS: "Do you have any other symptoms?" (e.g., dizziness, fever, weakness; new onset or worsening).      Had nausea after fall 10. CAUSE: "What do you think caused the fall (or falling)?" (e.g., tripped, dizzy spell)       Missed a step  Protocols used: Falls and Falling-A-AH, Head Injury-A-AH

## 2023-12-26 NOTE — Telephone Encounter (Deleted)
Chief Complaint: fell backwards from steps. Missed the last step Symptoms: nausea hit back pf head and back Frequency: today Pertinent Negatives: Patient denies LOC, bleeding Disposition: [x] ED /[] Urgent Care (no appt availability in office) / [] Appointment(In office/virtual)/ []  [facility]/ [] Home Care/ [] Refused Recommended Disposition /[]  [facility]/ []  Follow-up with PCP Additional Notes: due to height of fall and hitting head plus nausea sent to ED Reason for Disposition  Dangerous injury (e.g., MVA, diving, trampoline, contact sports, fall > 10 feet or 3 meters) or severe blow from hard object (e.g., golf club or baseball bat)  Answer Assessment - Initial Assessment Questions 1. MECHANISM: "How did the injury happen?" For falls, ask: "What height did you fall from?" and "What surface did you fall against?"      Missed a step and fell backwards 2. ONSET: "When did the injury happen?" (Minutes or hours ago)      today 3. NEUROLOGIC SYMPTOMS: "Was there any loss of consciousness?" "Are there any other neurological symptoms?"      Nausea no LOC 4. MENTAL STATUS: "Does the person know who they are, who you are, and where they are?"      yes  6. SCALP APPEARANCE: "What does the scalp look like? Is it bleeding now?" If Yes, ask: "Is it difficult to stop?"      No bleeing 7. SIZE: For cuts, bruises, or swelling, ask: "How large is it?" (e.g., inches or centimeters)      noe 8. PAIN: "Is there any pain?" If Yes, ask: "How bad is it?"  (e.g., Scale 1-10; or mild, moderate, severe)     none 9. TETANUS: For any breaks in the skin, ask: "When was the last tetanus booster?"     N/a 10. OTHER SYMPTOMS: "Do you have any other symptoms?" (e.g., neck pain, vomiting)       nausea 11. PREGNANCY: "Is there any chance you are pregnant?" "When was your last menstrual period?"       N/a  Answer Assessment - Initial Assessment Questions 1. MECHANISM: "How did the fall  happen?"     *** 2. DOMESTIC VIOLENCE AND ELDER ABUSE SCREENING: "Did you fall because someone pushed you or tried to hurt you?" If Yes, ask: "Are you safe now?"     *** 3. ONSET: "When did the fall happen?" (e.g., minutes, hours, or days ago)     *** 4. LOCATION: "What part of the body hit the ground?" (e.g., back, buttocks, head, hips, knees, hands, head, stomach)     *** 5. INJURY: "Did you hurt (injure) yourself when you fell?" If Yes, ask: "What did you injure? Tell me more about this?" (e.g., body area; type of injury; pain severity)"     *** 6. PAIN: "Is there any pain?" If Yes, ask: "How bad is the pain?" (e.g., Scale 1-10; or mild,  moderate, severe)   - NONE (0): No pain   - MILD (1-3): Doesn't interfere with normal activities    - MODERATE (4-7): Interferes with normal activities or awakens from sleep    - SEVERE (8-10): Excruciating pain, unable to do any normal activities      *** 7. SIZE: For cuts, bruises, or swelling, ask: "How large is it?" (e.g., inches or centimeters)      *** 8. PREGNANCY: "Is there any chance you are pregnant?" "When was your last menstrual period?"     *** 9. OTHER SYMPTOMS: "Do you have any other symptoms?" (e.g., dizziness, fever,  weakness; new onset or worsening).      *** 10. CAUSE: "What do you think caused the fall (or falling)?" (e.g., tripped, dizzy spell)       ***  Protocols used: Falls and Falling-A-AH, Head Injury-A-AH

## 2023-12-26 NOTE — ED Notes (Signed)
Pt ambulatory to room. Denies LOC and is not on blood thinners

## 2023-12-26 NOTE — ED Notes (Signed)
D/c paperwork reviewed with pt, including follow up care.  No questions or concerns voiced at time of d/c. Marland Kitchen Pt verbalized understanding, Ambulatory with significant other to ED exit, NAD.

## 2023-12-30 ENCOUNTER — Other Ambulatory Visit (HOSPITAL_COMMUNITY): Payer: Self-pay

## 2024-01-05 ENCOUNTER — Other Ambulatory Visit (HOSPITAL_COMMUNITY): Payer: Self-pay

## 2024-01-17 ENCOUNTER — Other Ambulatory Visit: Payer: Self-pay

## 2024-01-20 ENCOUNTER — Other Ambulatory Visit (HOSPITAL_COMMUNITY): Payer: Self-pay

## 2024-01-23 ENCOUNTER — Other Ambulatory Visit (HOSPITAL_COMMUNITY): Payer: Self-pay

## 2024-01-23 MED ORDER — BENZONATATE 100 MG PO CAPS
ORAL_CAPSULE | ORAL | 0 refills | Status: DC
Start: 2024-01-23 — End: 2024-04-01
  Filled 2024-01-23: qty 30, 10d supply, fill #0

## 2024-01-23 MED ORDER — PAXLOVID (300/100) 20 X 150 MG & 10 X 100MG PO TBPK
ORAL_TABLET | ORAL | 0 refills | Status: DC
  Filled 2024-01-23: qty 30, 5d supply, fill #0

## 2024-01-29 ENCOUNTER — Other Ambulatory Visit (HOSPITAL_COMMUNITY): Payer: Self-pay

## 2024-01-30 ENCOUNTER — Other Ambulatory Visit (HOSPITAL_COMMUNITY): Payer: Self-pay

## 2024-01-30 ENCOUNTER — Other Ambulatory Visit: Payer: Self-pay

## 2024-02-13 ENCOUNTER — Other Ambulatory Visit (HOSPITAL_COMMUNITY): Payer: Self-pay

## 2024-03-16 ENCOUNTER — Encounter: Payer: PPO | Admitting: Nurse Practitioner

## 2024-03-31 ENCOUNTER — Encounter (HOSPITAL_COMMUNITY): Payer: Self-pay | Admitting: Emergency Medicine

## 2024-03-31 ENCOUNTER — Emergency Department (HOSPITAL_COMMUNITY)

## 2024-03-31 ENCOUNTER — Observation Stay (HOSPITAL_COMMUNITY)
Admission: EM | Admit: 2024-03-31 | Discharge: 2024-04-01 | Disposition: A | Discharge status: Home / Self Care | Attending: Internal Medicine | Admitting: Internal Medicine

## 2024-03-31 ENCOUNTER — Other Ambulatory Visit: Payer: Self-pay

## 2024-03-31 DIAGNOSIS — I700 Atherosclerosis of aorta: Secondary | ICD-10-CM | POA: Diagnosis not present

## 2024-03-31 DIAGNOSIS — R519 Headache, unspecified: Secondary | ICD-10-CM | POA: Insufficient documentation

## 2024-03-31 DIAGNOSIS — Z79899 Other long term (current) drug therapy: Secondary | ICD-10-CM | POA: Insufficient documentation

## 2024-03-31 DIAGNOSIS — M545 Low back pain, unspecified: Secondary | ICD-10-CM | POA: Diagnosis not present

## 2024-03-31 DIAGNOSIS — R55 Syncope and collapse: Principal | ICD-10-CM | POA: Insufficient documentation

## 2024-03-31 DIAGNOSIS — Z7984 Long term (current) use of oral hypoglycemic drugs: Secondary | ICD-10-CM | POA: Diagnosis not present

## 2024-03-31 DIAGNOSIS — E875 Hyperkalemia: Secondary | ICD-10-CM | POA: Insufficient documentation

## 2024-03-31 DIAGNOSIS — R0609 Other forms of dyspnea: Secondary | ICD-10-CM | POA: Diagnosis not present

## 2024-03-31 DIAGNOSIS — E1149 Type 2 diabetes mellitus with other diabetic neurological complication: Secondary | ICD-10-CM | POA: Diagnosis not present

## 2024-03-31 DIAGNOSIS — N182 Chronic kidney disease, stage 2 (mild): Secondary | ICD-10-CM | POA: Diagnosis not present

## 2024-03-31 DIAGNOSIS — Z85828 Personal history of other malignant neoplasm of skin: Secondary | ICD-10-CM | POA: Insufficient documentation

## 2024-03-31 DIAGNOSIS — K219 Gastro-esophageal reflux disease without esophagitis: Secondary | ICD-10-CM | POA: Diagnosis not present

## 2024-03-31 DIAGNOSIS — Z96651 Presence of right artificial knee joint: Secondary | ICD-10-CM | POA: Insufficient documentation

## 2024-03-31 DIAGNOSIS — E1122 Type 2 diabetes mellitus with diabetic chronic kidney disease: Secondary | ICD-10-CM | POA: Diagnosis not present

## 2024-03-31 DIAGNOSIS — E785 Hyperlipidemia, unspecified: Secondary | ICD-10-CM | POA: Insufficient documentation

## 2024-03-31 DIAGNOSIS — J45909 Unspecified asthma, uncomplicated: Secondary | ICD-10-CM | POA: Diagnosis not present

## 2024-03-31 DIAGNOSIS — G4733 Obstructive sleep apnea (adult) (pediatric): Secondary | ICD-10-CM | POA: Diagnosis not present

## 2024-03-31 DIAGNOSIS — Z7982 Long term (current) use of aspirin: Secondary | ICD-10-CM | POA: Diagnosis not present

## 2024-03-31 DIAGNOSIS — I6521 Occlusion and stenosis of right carotid artery: Secondary | ICD-10-CM | POA: Diagnosis not present

## 2024-03-31 DIAGNOSIS — I10 Essential (primary) hypertension: Secondary | ICD-10-CM | POA: Diagnosis present

## 2024-03-31 DIAGNOSIS — E1140 Type 2 diabetes mellitus with diabetic neuropathy, unspecified: Secondary | ICD-10-CM | POA: Diagnosis not present

## 2024-03-31 DIAGNOSIS — I129 Hypertensive chronic kidney disease with stage 1 through stage 4 chronic kidney disease, or unspecified chronic kidney disease: Secondary | ICD-10-CM | POA: Diagnosis not present

## 2024-03-31 DIAGNOSIS — Z7902 Long term (current) use of antithrombotics/antiplatelets: Secondary | ICD-10-CM | POA: Insufficient documentation

## 2024-03-31 LAB — CBC
HCT: 43.3 % (ref 39.0–52.0)
Hemoglobin: 14.8 g/dL (ref 13.0–17.0)
MCH: 31.7 pg (ref 26.0–34.0)
MCHC: 34.2 g/dL (ref 30.0–36.0)
MCV: 92.7 fL (ref 80.0–100.0)
Platelets: 295 10*3/uL (ref 150–400)
RBC: 4.67 MIL/uL (ref 4.22–5.81)
RDW: 12.8 % (ref 11.5–15.5)
WBC: 9.6 10*3/uL (ref 4.0–10.5)
nRBC: 0 % (ref 0.0–0.2)

## 2024-03-31 LAB — MAGNESIUM: Magnesium: 2.2 mg/dL (ref 1.7–2.4)

## 2024-03-31 LAB — COMPREHENSIVE METABOLIC PANEL WITH GFR
ALT: 22 U/L (ref 0–44)
AST: 24 U/L (ref 15–41)
Albumin: 3.9 g/dL (ref 3.5–5.0)
Alkaline Phosphatase: 30 U/L — ABNORMAL LOW (ref 38–126)
Anion gap: 9 (ref 5–15)
BUN: 17 mg/dL (ref 8–23)
CO2: 24 mmol/L (ref 22–32)
Calcium: 9.6 mg/dL (ref 8.9–10.3)
Chloride: 106 mmol/L (ref 98–111)
Creatinine, Ser: 1.41 mg/dL — ABNORMAL HIGH (ref 0.61–1.24)
GFR, Estimated: 52 mL/min — ABNORMAL LOW (ref >60)
Glucose, Bld: 133 mg/dL — ABNORMAL HIGH (ref 70–99)
Potassium: 5.9 mmol/L — ABNORMAL HIGH (ref 3.5–5.1)
Sodium: 139 mmol/L (ref 135–145)
Total Bilirubin: 0.4 mg/dL (ref 0.0–1.2)
Total Protein: 7 g/dL (ref 6.5–8.1)

## 2024-03-31 LAB — TROPONIN I (HIGH SENSITIVITY)
Troponin I (High Sensitivity): 6 ng/L (ref <18)
Troponin I (High Sensitivity): 5 ng/L (ref <18)

## 2024-03-31 NOTE — ED Provider Notes (Signed)
 [facility] Provider Note   CSN: [id number] Arrival date & time: 03/31/24  1539     History  Chief Complaint  Patient presents with   Chest Pain   Dizziness   Back Pain    Joshua Koch is a 78 y.o. male.  The history is provided by the patient and medical records.  Chest Pain Associated symptoms: back pain and dizziness   Dizziness Associated symptoms: chest pain   Back Pain Associated symptoms: chest pain    78 year old male with history of chronic kidney disease, hypertension, hyperlipidemia, chronic back issues, diabetes, presenting to the ED for near syncopal events.  Patient and wife reports this has been occurring over the past 2 to 3 weeks, seems to be increasing in frequency.  He often notices this while sitting, states he just gets a sensation, starting in his upper chest and then radiating up into the head as if he is lightheaded and is going to pass out.  He has never fully lost consciousness.  Patient reports if he sits still and waits for a while symptoms will gradually resolve on their own.  Infrequently will have a mild headache associated with this.  He does not have any blurred vision, changes in speech, numbness, or weakness when this occurs.  Has been able to normal activities lately (cleaning out pool, mowing lawn, etc without issue).  Seems to occur at random times during day (sometimes morning, sometimes evening, happened today while eating lunch).  He does have chronic back issues most of the time, sometimes feels it radiate into the chest but feels this is stemming from his back and not his chest.  He denies any prior cardiac history.  No prior history of TIA or stroke.  He did have cardiac stress test in 2015 that was normal.  Waiting to see new PCP, no appt until 04/24/24.  Cardiology cannot get him in until August 2025.  Home Medications Prior to Admission medications   Medication Sig Start Date End Date Taking?  Authorizing Provider  Ascorbic Acid (VITAMIN C PO) Take by mouth. 1 tablet daily    [provider]  aspirin  (ECOTRIN LOW STRENGTH) 81 MG EC tablet Take 1 tablet daily 09/06/18   Vangie Genet, MD  benzonatate  (TESSALON ) 100 MG capsule Take two capsules (200 mg dose) by mouth 3 (three) times a day as needed for Cough. 01/23/24     cetirizine (ZYRTEC) 10 MG tablet Take 10 mg by mouth daily.    [provider]  CHOLECALCIFEROL PO Take 10,000 Units by mouth daily.    [provider]  Cyanocobalamin (B-12) 500 MCG SUBL Place 1 tablet under the tongue daily.    [provider]  glimepiride  (AMARYL ) 4 MG tablet Take 1 tablet (4 mg total) by mouth daily with breakfast. 07/12/23   Cranford, Tonya, NP  ipratropium (ATROVENT ) 0.06 % nasal spray Use 1 to 2 sprays each nostril 2 to 3 times day as needed 12/17/23   Vangie Genet, MD  Magnesium 400 MG CAPS Take 1 capsule by mouth 2 (two) times daily.    [provider]  metFORMIN  (GLUCOPHAGE -XR) 500 MG 24 hr tablet Take 2 tablets (1,000 mg total) by mouth 2 (two) times daily with a meal for diabetes. 05/06/23   Vangie Genet, MD  nirmatrelvir /ritonavir  (PAXLOVID , 300/100,) 20 x 150 MG & 10 x 100MG  TBPK Take three tablets by mouth 2 (two) times daily for 5 days. 01/23/24  olmesartan  (BENICAR ) 20 MG tablet Take 1 tablet (20 mg total) by mouth daily. 10/23/23   Wilkinson, Dana E, FNP  Omega-3 Fatty Acids (FISH OIL PO) Take by mouth 2 (two) times daily.    [provider]  rosuvastatin  (CRESTOR ) 10 MG tablet Take 1 tablet (10 mg total) by mouth daily for cholesterol 06/24/23   Vangie Genet, MD  tirzepatide  (MOUNJARO ) 2.5 MG/0.5ML Pen Inject 1 pen (2.5 mg) into Skin every 7 days for Diabetes  ( E11.29) 12/23/23   Vangie Genet, MD  Zinc 50 MG TABS Take by mouth daily.    [provider]      Allergies    No known allergies    Review of Systems   Review of Systems  Cardiovascular:  Positive  for chest pain.  Musculoskeletal:  Positive for back pain.  Neurological:  Positive for dizziness.  All other systems reviewed and are negative.   Physical Exam Updated Vital Signs BP (!) 152/76 (BP Location: Left Arm)   Pulse 66   Temp 97.9 F (36.6 C)   Resp 18   Ht 6' (1.829 m)   Wt 112.9 kg   SpO2 97%   BMI 33.77 kg/m   Physical Exam Vitals and nursing note reviewed.  Constitutional:      Appearance: He is well-developed.  HENT:     Head: Normocephalic and atraumatic.  Eyes:     Conjunctiva/sclera: Conjunctivae normal.     Pupils: Pupils are equal, round, and reactive to light.  Cardiovascular:     Rate and Rhythm: Normal rate and regular rhythm.     Heart sounds: Normal heart sounds.  Pulmonary:     Effort: Pulmonary effort is normal.     Breath sounds: Normal breath sounds.  Abdominal:     General: Bowel sounds are normal.     Palpations: Abdomen is soft.  Musculoskeletal:        General: Normal range of motion.     Cervical back: Normal range of motion.  Skin:    General: Skin is warm and dry.  Neurological:     Mental Status: He is alert and oriented to person, place, and time.     Comments: AAOx3, answering questions and following commands appropriately; equal strength UE and LE bilaterally; CN grossly intact; moves all extremities appropriately without ataxia; no focal neuro deficits or facial asymmetry appreciated, speech is clear and goal oriented     ED Results / Procedures / Treatments   Labs (all labs ordered are listed, but only abnormal results are displayed) Labs Reviewed  COMPREHENSIVE METABOLIC PANEL WITH GFR - Abnormal; Notable for the following components:      Result Value   Potassium 5.9 (*)    Glucose, Bld 133 (*)    Creatinine, Ser 1.41 (*)    Alkaline Phosphatase 30 (*)    GFR, Estimated 52 (*)    All other components within normal limits  TSH - Abnormal; Notable for the following components:   TSH 7.907 (*)    All other  components within normal limits  CBC  MAGNESIUM  POTASSIUM  T4, FREE  TROPONIN I (HIGH SENSITIVITY)  TROPONIN I (HIGH SENSITIVITY)    EKG EKG Interpretation Date/Time:  Tuesday March 31 2024 15:50:12 EDT Ventricular Rate:  73 PR Interval:  210 QRS Duration:  96 QT Interval:  350 QTC Calculation: 385 R Axis:   -38  Text Interpretation: Sinus rhythm with marked sinus arrhythmia with 1st degree A-V block Left axis  deviation Minimal voltage criteria for LVH, may be normal variant ( R in aVL ) Abnormal ECG When compared with ECG of 15-Oct-2017 11:36, PREVIOUS ECG IS PRESENT No significant change since last tracing Confirmed by Sueellen Emery [phone]) on 03/31/2024 10:07:40 PM  Radiology CT ANGIO HEAD NECK W WO CM Result Date: 04/01/2024 CLINICAL DATA:  Syncope EXAM: CT ANGIOGRAPHY HEAD AND NECK WITH AND WITHOUT CONTRAST TECHNIQUE: Multidetector CT imaging of the head and neck was performed using the standard protocol during bolus administration of intravenous contrast. Multiplanar CT image reconstructions and MIPs were obtained to evaluate the vascular anatomy. Carotid stenosis measurements (when applicable) are obtained utilizing NASCET criteria, using the distal internal carotid diameter as the denominator. RADIATION DOSE REDUCTION: This exam was performed according to the departmental dose-optimization program which includes automated exposure control, adjustment of the mA and/or kV according to patient size and/or use of iterative reconstruction technique. CONTRAST:  75mL OMNIPAQUE  IOHEXOL  350 MG/ML SOLN COMPARISON:  None Available. FINDINGS: CT HEAD FINDINGS Brain: No mass,hemorrhage or extra-axial collection. Normal appearance of the parenchyma and CSF spaces. Vascular: No hyperdense vessel or unexpected vascular calcification. Skull: The visualized skull base, calvarium and extracranial soft tissues are normal. Sinuses/Orbits: Retained secretions in the left sphenoid sinus. Normal orbits. CTA  NECK FINDINGS Skeleton: No acute abnormality or high grade bony spinal canal stenosis. Other neck: Normal pharynx, larynx and major salivary glands. No cervical lymphadenopathy. Unremarkable thyroid  gland. Upper chest: No pneumothorax or pleural effusion. No nodules or masses. Aortic arch: There is calcific atherosclerosis of the aortic arch. Conventional 3 vessel aortic branching pattern. RIGHT carotid system: Diffuse narrowing of the left ICA beginning just distal to the bifurcation and extending to the skull base. LEFT carotid system: No dissection, occlusion or aneurysm. There is mixed density atherosclerosis extending into the proximal ICA, resulting in less than 50% stenosis. Vertebral arteries: Right dominant configuration. There is no dissection, occlusion or flow-limiting stenosis to the skull base (V1-V3 segments). CTA HEAD FINDINGS POSTERIOR CIRCULATION: Vertebral arteries are normal. No proximal occlusion of the anterior or inferior cerebellar arteries. Basilar artery is normal. Superior cerebellar arteries are normal. Posterior cerebral arteries are normal. ANTERIOR CIRCULATION: Atherosclerotic calcification of the internal carotid arteries with severe stenosis of the bilateral cavernous segments. The right carotid terminus is occluded. Anterior cerebral arteries are normal. Middle cerebral arteries are normal. Venous sinuses: As permitted by contrast timing, patent. Anatomic variants: None Review of the MIP images confirms the above findings. IMPRESSION: 1. Occlusion of the right carotid terminus. 2. Diffuse narrowing of the left ICA beginning just distal to the bifurcation and extending to the skull base, likely due to chronic atherosclerotic disease. 3. 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 Aortic Atherosclerosis (ICD10-I70.0). These results were called by telephone at the time of interpretation on 04/01/2024 at 1:01 am to provider [facility] , who  verbally acknowledged these results. Electronically Signed   By: Juanetta Nordmann M.D.   On: 04/01/2024 01:01   DG Chest 2 View Result Date: 03/31/2024 CLINICAL DATA:  Chest pain EXAM: CHEST - 2 VIEW COMPARISON:  08/16/2017 FINDINGS: The heart size and mediastinal contours are within normal limits. Both lungs are clear. The visualized skeletal structures are unremarkable. IMPRESSION: No active cardiopulmonary disease. Electronically Signed   By: Rozell Cornet M.D.   On: 03/31/2024 17:07    Procedures Procedures    CRITICAL CARE Performed by: Coretha Dew   Total critical care time: 45 minutes  Critical  care time was exclusive of separately billable procedures and treating other patients.  Critical care was necessary to treat or prevent imminent or life-threatening deterioration.  Critical care was time spent personally by me on the following activities: development of treatment plan with patient and/or surrogate as well as nursing, discussions with consultants, evaluation of patient's response to treatment, examination of patient, obtaining history from patient or surrogate, ordering and performing treatments and interventions, ordering and review of laboratory studies, ordering and review of radiographic studies, pulse oximetry and re-evaluation of patient's condition.   Medications Ordered in ED Medications  aspirin  EC tablet 81 mg (has no administration in time range)  rosuvastatin  (CRESTOR ) tablet 40 mg (has no administration in time range)  sodium chloride  flush (NS) 0.9 % injection 3 mL (has no administration in time range)  heparin  injection 5,000 Units (has no administration in time range)  acetaminophen  (TYLENOL ) tablet 650 mg (has no administration in time range)    Or  acetaminophen  (TYLENOL ) suppository 650 mg (has no administration in time range)  ondansetron  (ZOFRAN ) tablet 4 mg (has no administration in time range)    Or  ondansetron  (ZOFRAN ) injection 4 mg (has no  administration in time range)  insulin  aspart (novoLOG ) injection 0-9 Units (has no administration in time range)  iohexol  (OMNIPAQUE ) 350 MG/ML injection 75 mL (75 mLs Intravenous Contrast Given 04/01/24 0048)    ED Course/ Medical Decision Making/ A&P                                 Medical Decision Making Amount and/or Complexity of Data Reviewed Labs: ordered. Radiology: ordered and independent interpretation performed. ECG/medicine tests: ordered and independent interpretation performed.  Risk Prescription drug management. Decision regarding hospitalization.   78 year old male presenting to the ED with episodes of lightheadedness over the past 3 weeks or so.  Seems to be increasing in frequency.  Usually happening with sitting, not really associated with exertion.  He is awake, alert, oriented here.  He is currently asymptomatic and vitals are stable.  He is not have any focal neurologic deficits.  Labs are obtained from triage and reviewed--no leukocytosis.  Potassium reading high, however suspect this is likely hemolysis.  Re-checked and WNL at 4.0.  Troponin negative x 2.  Chest x-ray is clear.  Lonne Roan of his symptoms somewhat atypical.  He has not had full syncope and is not really having any chest pain associated with this.  Concern for possible arterial occlusion.  Plan for CTA of head neck.  Anticipate admission.  Spoke with radiology, Dr. Agapito Horseman occlusion of right ICA.  Does appear to have some collaterals.  Left is stenotic but appears to have some flow.  Suspect this is likely etiology of his symptoms.  Will discuss with vascular surgery and plan for admission.  Patient currently remains asymptomatic.  2:03 AM Spoke with vascular, Dr. Vikki Graves-- recommended to discuss with neurology regarding heparin .  They can see in AM.  Spoke with hospitalist, Dr. Andy Bannister-- will admit for ongoing care.  We will go ahead and start heparin  for now pending neuro recs.  Spoke with  neurology, Dr. Murvin Arthurs-- agrees with heparin  and MRI for now.  Further recs based on MRI findings.  Neuro will see in consult.  Final Clinical Impression(s) / ED Diagnoses Final diagnoses:  Right carotid artery occlusion    Rx / DC Orders ED Discharge Orders     None  Coretha Dew, PA-C 04/01/24 0404    Sueellen Emery, MD 04/03/24 [phone]

## 2024-03-31 NOTE — ED Triage Notes (Signed)
 PT complains of Chest pain, back pain and lightheadedness x 2 weeks. Pt states pains are coming more frequently. Gets SOB with walking. Denies nausea and vomiting.

## 2024-03-31 NOTE — ED Provider Triage Note (Signed)
 Emergency Medicine Provider Triage Evaluation Note  Joshua Koch , a 78 y.o. male  was evaluated in triage.  Pt complains of intermittent feelings of presyncope over the past couple weeks.  Also reports some associated chest pain and shortness of breath when this happens.  He has not actually passed out.  Review of Systems  Positive: As above Negative: As above  Physical Exam  BP (!) 169/65 (BP Location: Left Arm)   Pulse 77   Temp 98.8 F (37.1 C)   Resp 18   Ht 6' (1.829 m)   Wt 112.9 kg   SpO2 96%   BMI 33.77 kg/m  Gen:   Awake, no distress   Resp:  Normal effort  MSK:   Moves extremities without difficulty    Medical Decision Making  Medically screening exam initiated at 4:13 PM.  Appropriate orders placed.  TABER SWEETSER was informed that the remainder of the evaluation will be completed by another provider, this initial triage assessment does not replace that evaluation, and the importance of remaining in the ED until their evaluation is complete.     Rexie Catena, PA-C 03/31/24 1614

## 2024-04-01 ENCOUNTER — Inpatient Hospital Stay (HOSPITAL_COMMUNITY)

## 2024-04-01 ENCOUNTER — Inpatient Hospital Stay (HOSPITAL_BASED_OUTPATIENT_CLINIC_OR_DEPARTMENT_OTHER)

## 2024-04-01 ENCOUNTER — Emergency Department (HOSPITAL_COMMUNITY)

## 2024-04-01 ENCOUNTER — Other Ambulatory Visit (HOSPITAL_COMMUNITY): Payer: Self-pay

## 2024-04-01 DIAGNOSIS — I6521 Occlusion and stenosis of right carotid artery: Secondary | ICD-10-CM | POA: Diagnosis not present

## 2024-04-01 DIAGNOSIS — E119 Type 2 diabetes mellitus without complications: Secondary | ICD-10-CM

## 2024-04-01 DIAGNOSIS — Z7984 Long term (current) use of oral hypoglycemic drugs: Secondary | ICD-10-CM

## 2024-04-01 DIAGNOSIS — I10 Essential (primary) hypertension: Secondary | ICD-10-CM

## 2024-04-01 DIAGNOSIS — E785 Hyperlipidemia, unspecified: Secondary | ICD-10-CM

## 2024-04-01 DIAGNOSIS — R55 Syncope and collapse: Secondary | ICD-10-CM | POA: Diagnosis present

## 2024-04-01 DIAGNOSIS — R42 Dizziness and giddiness: Secondary | ICD-10-CM

## 2024-04-01 DIAGNOSIS — R1319 Other dysphagia: Secondary | ICD-10-CM

## 2024-04-01 DIAGNOSIS — I6523 Occlusion and stenosis of bilateral carotid arteries: Secondary | ICD-10-CM

## 2024-04-01 DIAGNOSIS — I69391 Dysphagia following cerebral infarction: Secondary | ICD-10-CM

## 2024-04-01 LAB — ECHOCARDIOGRAM COMPLETE
AR max vel: 1.84 cm2
AV Area VTI: 1.82 cm2
AV Area mean vel: 1.84 cm2
AV Mean grad: 11 mmHg
AV Peak grad: 18.5 mmHg
Ao pk vel: 2.15 m/s
Area-P 1/2: 3.06 cm2
Height: 72 in
MV VTI: 3.72 cm2
S' Lateral: 3.2 cm
Weight: 3984 [oz_av]

## 2024-04-01 LAB — CBC
HCT: 42.8 % (ref 39.0–52.0)
Hemoglobin: 14 g/dL (ref 13.0–17.0)
MCH: 30.2 pg (ref 26.0–34.0)
MCHC: 32.7 g/dL (ref 30.0–36.0)
MCV: 92.4 fL (ref 80.0–100.0)
Platelets: 245 10*3/uL (ref 150–400)
RBC: 4.63 MIL/uL (ref 4.22–5.81)
RDW: 12.9 % (ref 11.5–15.5)
WBC: 9.4 10*3/uL (ref 4.0–10.5)
nRBC: 0 % (ref 0.0–0.2)

## 2024-04-01 LAB — COMPREHENSIVE METABOLIC PANEL WITH GFR
ALT: 24 U/L (ref 0–44)
AST: 22 U/L (ref 15–41)
Albumin: 3.5 g/dL (ref 3.5–5.0)
Alkaline Phosphatase: 27 U/L — ABNORMAL LOW (ref 38–126)
Anion gap: 10 (ref 5–15)
BUN: 15 mg/dL (ref 8–23)
CO2: 22 mmol/L (ref 22–32)
Calcium: 8.8 mg/dL — ABNORMAL LOW (ref 8.9–10.3)
Chloride: 107 mmol/L (ref 98–111)
Creatinine, Ser: 1.2 mg/dL (ref 0.61–1.24)
GFR, Estimated: >60 mL/min (ref >60)
Glucose, Bld: 95 mg/dL (ref 70–99)
Potassium: 4.1 mmol/L (ref 3.5–5.1)
Sodium: 139 mmol/L (ref 135–145)
Total Bilirubin: 0.7 mg/dL (ref 0.0–1.2)
Total Protein: 6.5 g/dL (ref 6.5–8.1)

## 2024-04-01 LAB — D-DIMER, QUANTITATIVE: D-Dimer, Quant: 0.28 ug{FEU}/mL (ref 0.00–0.50)

## 2024-04-01 LAB — CBG MONITORING, ED
Glucose-Capillary: 111 mg/dL — ABNORMAL HIGH (ref 70–99)
Glucose-Capillary: 160 mg/dL — ABNORMAL HIGH (ref 70–99)

## 2024-04-01 LAB — TSH: TSH: 7.907 u[IU]/mL — ABNORMAL HIGH (ref 0.350–4.500)

## 2024-04-01 LAB — POTASSIUM: Potassium: 4.4 mmol/L (ref 3.5–5.1)

## 2024-04-01 LAB — T4, FREE: Free T4: 0.77 ng/dL (ref 0.61–1.12)

## 2024-04-01 MED ORDER — ACETAMINOPHEN 650 MG RE SUPP: 650.0000 mg | Freq: Four times a day (QID) | RECTAL | Status: DC | PRN

## 2024-04-01 MED ORDER — CLOPIDOGREL BISULFATE 75 MG PO TABS
75.0000 mg | ORAL_TABLET | Freq: Every day | ORAL | 0 refills | Status: DC
  Filled 2024-04-01: qty 90, 90d supply, fill #0

## 2024-04-01 MED ORDER — ONDANSETRON HCL 4 MG/2ML IJ SOLN: 4.0000 mg | Freq: Four times a day (QID) | INTRAMUSCULAR | Status: DC | PRN

## 2024-04-01 MED ORDER — INSULIN ASPART 100 UNIT/ML IJ SOLN
0.0000 [IU] | Freq: Three times a day (TID) | INTRAMUSCULAR | Status: DC
Start: 2024-04-01 — End: 2024-04-01
  Administered 2024-04-01: 2 [IU] via SUBCUTANEOUS

## 2024-04-01 MED ORDER — HEPARIN SODIUM (PORCINE) 5000 UNIT/ML IJ SOLN
5000.0000 [IU] | Freq: Three times a day (TID) | INTRAMUSCULAR | Status: DC
  Administered 2024-04-01: 5000 [IU] via SUBCUTANEOUS
  Filled 2024-04-01: qty 1

## 2024-04-01 MED ORDER — ONDANSETRON HCL 4 MG PO TABS: 4.0000 mg | ORAL_TABLET | Freq: Four times a day (QID) | ORAL | Status: DC | PRN

## 2024-04-01 MED ORDER — ASPIRIN 81 MG PO TBEC
81.0000 mg | DELAYED_RELEASE_TABLET | Freq: Every day | ORAL | Status: DC
  Administered 2024-04-01: 81 mg via ORAL
  Filled 2024-04-01: qty 1

## 2024-04-01 MED ORDER — GLIMEPIRIDE 4 MG PO TABS
4.0000 mg | ORAL_TABLET | Freq: Every day | ORAL | 2 refills | Status: AC
  Filled 2024-04-01: qty 90, 90d supply, fill #0
  Filled 2024-07-01: qty 90, 90d supply, fill #1
  Filled 2024-09-21: qty 90, 90d supply, fill #2

## 2024-04-01 MED ORDER — ACETAMINOPHEN 325 MG PO TABS
650.0000 mg | ORAL_TABLET | Freq: Four times a day (QID) | ORAL | Status: DC | PRN
  Filled 2024-04-01: qty 2

## 2024-04-01 MED ORDER — IOHEXOL 350 MG/ML SOLN
75.0000 mL | Freq: Once | INTRAVENOUS | Status: AC | PRN
  Administered 2024-04-01: 75 mL via INTRAVENOUS

## 2024-04-01 MED ORDER — ROSUVASTATIN CALCIUM 20 MG PO TABS
40.0000 mg | ORAL_TABLET | Freq: Every day | ORAL | Status: DC
  Administered 2024-04-01: 40 mg via ORAL
  Filled 2024-04-01: qty 2

## 2024-04-01 MED ORDER — SODIUM CHLORIDE 0.9% FLUSH
3.0000 mL | Freq: Two times a day (BID) | INTRAVENOUS | Status: DC
  Administered 2024-04-01: 3 mL via INTRAVENOUS

## 2024-04-01 NOTE — ED Notes (Signed)
 Called and placed PT on monitor with CCMD.

## 2024-04-01 NOTE — Consult Note (Signed)
 NEUROLOGY CONSULT NOTE   Date of service: April 01, 2024 Patient Name: Joshua Koch MRN:  [id number] DOB:  1946/01/12 Chief Complaint: "near syncope, consult requestedby vascular surgery per discussion with ED team" Requesting Provider: Sabas Cradle, MD  History of Present Illness  Joshua Koch is a 78 y.o. male with hx of DM2, HTN, HLD, kidney stones, GERD, erectile dysfunction, struct of sleep apnea who was admitted with episodes of near syncope that have been become more frequent over the last couple weeks.  Patient presented with intermittent presyncope over the last couple weeks.  He describes a prodrome of pain going from his chest to his upper back on both sides and then a rising sensation followed by some lightheadedness.  The episodes exclusively occur while he is sitting.  He has had about 4 episodes over the course of the last 2 weeks.  He has never lost consciousness.  The episodes resolved spontaneously.  Patient had a CT angio of the head and neck as part of the workup which demonstrated occlusion of the right ICA, diffuse narrowing of the left ICA distal to bifurcation and extending to the skull base and suspected to be likely secondary to chronic atherosclerotic disease.  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  Neurology was consulted on request of vascular surgery for evaluation for possible strokes?  He does not have arm or leg shaking concerning for seizures, he denies any history of seizures, no family history of seizures.  He denies any personal prior history of strokes or TIAs.  He denies any arm or leg weakness or numbness, no aphasia/word finding difficulty, no slurred speech, no sensation of disequilibrium, no vision deficit.  He reports that he will rarely feel lightheaded if he tries to get up too fast.  He had an MRI of the brain which was negative for any acute.  Intracranial abnormalities.   Specifically, he does not have an acute stroke.   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   Dyspnea    Erectile dysfunction    GERD (gastroesophageal reflux disease)    Headache    Heart murmur    History of kidney stones    Hypercholesteremia    Hyperlipemia    Hypertension    Kidney stone    Low back pain    Obesity    Rotator cuff dysfunction    partial tear , left shoulder   Sleep apnea     Past Surgical History:  Procedure Laterality Date   COLONOSCOPY     KNEE ARTHROSCOPY     PILONIDAL CYST EXCISION     from base of spine   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 He has never used smokeless tobacco. He reports current alcohol use of about 1.0 standard drink of alcohol per week. He reports that he does not use drugs.  Allergies  Allergen Reactions   No Known Allergies     Medications   Current Facility-Administered Medications:    acetaminophen  (TYLENOL ) tablet 650 mg, 650 mg, Oral, Q6H PRN **OR** acetaminophen  (TYLENOL ) suppository 650 mg, 650 mg, Rectal, Q6H PRN, Sabas Cradle, MD   aspirin  EC tablet 81 mg, 81 mg, Oral, Daily, Sabas Cradle, MD   heparin  injection 5,000 Units, 5,000 Units, Subcutaneous, Q8H, Sabas Cradle, MD   insulin  aspart (novoLOG ) injection 0-9 Units, 0-9 Units,  Subcutaneous, TID WC, Sabas Cradle, MD   ondansetron  (ZOFRAN ) tablet 4 mg, 4 mg, Oral, Q6H PRN **OR** ondansetron  (ZOFRAN ) injection 4 mg, 4 mg, Intravenous, Q6H PRN, Sabas Cradle, MD   rosuvastatin  (CRESTOR ) tablet 40 mg, 40 mg, Oral, Daily, Tera Fellows A, MD   sodium chloride  flush (NS) 0.9 % injection 3 mL, 3 mL, Intravenous, Q12H, Tera Fellows A, MD, 3 mL at 04/01/24 1610  Current Outpatient Medications:    acetaminophen  (TYLENOL ) 500 MG tablet, Take 1,000 mg by mouth every 6 (six) hours as needed for mild pain (pain score 1-3) or headache., Disp: , Rfl:    ascorbic acid (VITAMIN C) 1000 MG tablet, Take 1,000 mg by mouth daily., Disp: , Rfl:    aspirin  (ECOTRIN LOW STRENGTH) 81 MG EC tablet, Take 1 tablet daily, Disp: , Rfl:    cetirizine (ZYRTEC) 10 MG tablet, Take 10 mg by mouth daily., Disp: , Rfl:    Cholecalciferol 250 MCG (10000 UT) CAPS, Take 1 capsule by mouth daily. Take one capsule daily Mon-Fri., Disp: , Rfl:    Cyanocobalamin (B-12) 500 MCG SUBL, Place 1 tablet under the tongue daily., Disp: , Rfl:    glimepiride  (AMARYL ) 4 MG tablet, Take 1 tablet (4 mg total) by mouth daily with breakfast., Disp: 90 tablet, Rfl: 2   ipratropium (ATROVENT ) 0.06 % nasal spray, Use 1 to 2 sprays each nostril 2 to 3 times day as needed, Disp: 45 mL, Rfl: 3   Magnesium 400 MG CAPS, Take 1 capsule by mouth daily., Disp: , Rfl:    metFORMIN  (GLUCOPHAGE -XR) 500 MG 24 hr tablet, Take 2 tablets (1,000 mg total) by mouth 2 (two) times daily with a meal for diabetes., Disp: 360 tablet, Rfl: 3   olmesartan  (BENICAR ) 20 MG tablet, Take 1 tablet (20 mg total) by mouth daily., Disp: 90 tablet, Rfl: 2   Omega-3 Fatty Acids (FISH OIL) 1000 MG CAPS, Take 1 capsule by mouth daily., Disp: , Rfl:    rosuvastatin  (CRESTOR ) 10 MG tablet, Take 1 tablet (10 mg total) by mouth daily for cholesterol, Disp: 90 tablet, Rfl: 3   Zinc 50 MG TABS, Take by mouth daily., Disp: , Rfl:   Vitals   Vitals:    03/31/24 2245 03/31/24 2300 03/31/24 2315 03/31/24 2345  BP: 137/61 (!) 130/56 (!) 141/61 (!) 148/133  Pulse: (!) 57 (!) 57 (!) 58 (!) 56  Resp: 11 16 17    Temp:      SpO2: 100% 100% 99% 100%  Weight:      Height:        Body mass index is 33.77 kg/m.  Physical Exam   General: Laying comfortably in bed; in no acute distress.  HENT: Normal oropharynx  and mucosa. Normal external appearance of ears and nose.  Neck: Supple, no pain or tenderness  CV: No JVD. No peripheral edema.  Pulmonary: Symmetric Chest rise. Normal respiratory effort.  Abdomen: Soft to touch, non-tender.  Ext: No cyanosis, edema, or deformity  Skin: No rash. Normal palpation of skin.   Musculoskeletal: Normal digits and nails by inspection. No clubbing.   Neurologic Examination  Mental status/Cognition: Alert, oriented to self, place, month and year, good attention.  Speech/language: Fluent, comprehension intact, object naming intact, repetition intact.  Cranial nerves:   CN II Pupils equal and reactive to light, no VF deficits    CN III,IV,VI EOM intact, no gaze preference or deviation, no nystagmus    CN V normal sensation in V1, V2, and V3 segments bilaterally    CN VII no asymmetry, no nasolabial fold flattening    CN VIII normal hearing to speech    CN IX & X normal palatal elevation, no uvular deviation    CN XI 5/5 head turn and 5/5 shoulder shrug bilaterally    CN XII midline tongue protrusion    Motor:  Muscle bulk: Normal, tone normal, pronator drift normal tremor has a slight low amplitude high-frequency tremor that is chronic per patient Mvmt Root Nerve  Muscle Right Left Comments  SA C5/6 Ax Deltoid 5 5   EF C5/6 Mc Biceps 5 5   EE C6/7/8 Rad Triceps 5 5   WF C6/7 Med FCR     WE C7/8 PIN ECU     F Ab C8/T1 U ADM/FDI 5 5   HF L1/2/3 Fem Illopsoas 5 5   KE L2/3/4 Fem Quad 5 5   DF L4/5 D Peron Tib Ant 5 5   PF S1/2 Tibial Grc/Sol 5 5    Sensation:  Light touch Intact throughout   Pin  prick    Temperature    Vibration   Proprioception    Coordination/Complex Motor:  - Finger to Nose intact bilaterally - Heel to shin intact bilaterally - Rapid alternating movement are normal - Gait: Deferred for patient's safety.  Labs/Imaging/Neurodiagnostic studies   CBC:  Recent Labs  Lab Apr 08, 2024 1552  WBC 9.6  HGB 14.8  HCT 43.3  MCV 92.7  PLT 295   Basic Metabolic Panel:  Lab Results  Component Value Date   NA 139 04/08/24   K 4.4 2024-04-08   CO2 24 April 08, 2024   GLUCOSE 133 (H) Apr 08, 2024   BUN 17 04-08-2024   CREATININE 1.41 (H) 08-Apr-2024   CALCIUM  9.6 2024/04/08   GFRNONAA 52 (L) 08-Apr-2024   GFRAA 88 05/10/2021   Lipid Panel:  Lab Results  Component Value Date   LDLCALC 75 12/17/2023   HgbA1c:  Lab Results  Component Value Date   HGBA1C 6.2 (H) 12/17/2023   Urine Drug Screen: No results found for: "LABOPIA", "COCAINSCRNUR", "LABBENZ", "AMPHETMU", "THCU", "LABBARB"  Alcohol Level No results found for: "ETH" INR  Lab Results  Component Value Date   INR 0.98 10/15/2017   APTT  Lab Results  Component Value Date   APTT 27 10/15/2017   AED levels: No results found for: "PHENYTOIN", "ZONISAMIDE", "LAMOTRIGINE", "LEVETIRACETA"  CT Head without contrast(Personally reviewed): CTH was negative for a large hypodensity concerning for a large territory infarct or hyperdensity concerning for an ICH  CT angio Head and Neck with contrast(Personally reviewed): 1. Occlusion of the right carotid terminus. 2. Diffuse narrowing of the left ICA beginning just distal to the bifurcation and extending to the  skull base, likely due to chronic atherosclerotic disease. 3. 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  MRI Brain(Personally reviewed): No acute stroke or other intracranial normality.  ASSESSMENT   Joshua Koch is a 78 y.o. male with hx of DM2, HTN, HLD, kidney stones, GERD, erectile dysfunction,  struct of sleep apnea who was admitted with episodes of near syncope that have been become more frequent over the last couple weeks.  He describes a prodrome of pain going from his chest to his upper back on both sides and then a rising sensation followed by some lightheadedness.  The episodes exclusively occur while he is sitting.  He has had about 4 episodes over the course of the last 2 weeks.  He has never lost consciousness.  He denies any focal deficits during the episodes, he denies any prior history of stroke or TIA.  He denies any stroke symptoms on history.  Imaging with no acute stroke.  Neuroexam with no focal deficit.  Overall, I do not think that the description of the episode provided by the patient is explained by carotid insufficiency.  Specifically the fact that the episodes occur only while he is sitting, he experiences some pain in his chest going into his back and he denies any focal deficit during the episode.  RECOMMENDATIONS  - I think at this time, the left carotid diffuse narrowing is likely asymptomatic.  I will have stroke team also evaluate the patient to see if they think otherwise. ______________________________________________________________________    Signed, Heywood Tokunaga, MD [facility]

## 2024-04-01 NOTE — Discharge Summary (Signed)
 Physician Discharge Summary   Joshua Koch YNW:[id number] DOB: 08-Jan-1946 DOA: 03/31/2024  PCP: Joshua Koch, No  Admit date: 03/31/2024 Discharge date: 04/01/2024  Admitted From: Home Disposition:  Home Discharging physician: Faith Homes, MD Barriers to discharge: none  Recommendations at discharge: Follow up with neurology and cardiology Repeat Lipid panel and adjust crestor  further if necessary    Discharge Condition: stable CODE STATUS: Full Diet recommendation:  Diet Orders (From admission, onward)     Start     Ordered   04/01/24 0250  Diet heart healthy/carb modified Room service appropriate? Yes; Fluid consistency: Thin  Diet effective now       Question Answer Comment  Diet-HS Snack? Nothing   Room service appropriate? Yes   Fluid consistency: Thin      04/01/24 0259   04/01/24 0000  Diet - low sodium heart healthy        04/01/24 1659   04/01/24 0000  Diet Carb Modified        04/01/24 1659            Hospital Course: Joshua Koch is a 78 y.o. male with medical history significant of HTN, DM II, HLD, obesity, sleep apnea who presented with complaints of dizziness/near syncope for the last few weeks.  His symptoms typically all occurred when he was at rest and sitting down. He was finally encouraged to come to the ER for further workup. He has also been having some intermittent hyperkalemia outpatient which has been monitored and seems to self resolve. He again was noted to have some hyperkalemia on admission, 5.9 with spontaneous resolution without any fluid or interventions.  Creatinine was also mildly elevated on admission, 1.41 which also returned to normal without any fluid resuscitation.  Multiple imaging studies were performed for presyncope workup.  CT angio head/neck showed occlusion of the right carotid terminus and diffuse narrowing of the left ICA along with severe stenosis of bilateral cavernous segments of the internal carotid arteries.  MRI brain negative  for acute infarcts.  Neurology and vascular surgery were consulted as well.  Echo was also obtained which showed normal EF, 60 to 65%, no RWMA, normal diastolic parameters. Aortic valve was noted to be severely calcified with mild aortic regurgitation and only mild aortic valve stenosis. Case was discussed with neurology and cardiology further.  He plans to follow-up outpatient with both for further workups if indicated. He was already taking baby aspirin  daily at home and started on 45-month course of Plavix  per neurology recommendations.  His Crestor  dose should be clarified and further increased if able.  LDL on 12/17/2023 was 75; neurology recommendations are to try and lower LDL below 70.  Could also repeat lipid panel at follow-up first prior to medication changes.   The patient's acute and chronic medical conditions were treated accordingly. On day of discharge, patient was felt deemed stable for discharge. Patient/family member advised to call PCP or come back to ER if needed.   Principal Diagnosis: Pre-syncope  Discharge Diagnoses: Active Hospital Problems   Diagnosis Date Noted   Pre-syncope 04/01/2024    Priority: 1.   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     Discharge Instructions     Diet -  low sodium heart healthy   Complete by: As directed    Diet Carb Modified   Complete by: As directed    Increase activity slowly   Complete by: As directed       Allergies as of 04/01/2024       Reactions   No Known Allergies         Medication List     TAKE these medications    acetaminophen  500 MG tablet Commonly known as: TYLENOL  Take 1,000 mg by mouth every 6 (six)  hours as needed for mild pain (pain score 1-3) or headache.   ascorbic acid 1000 MG tablet Commonly known as: VITAMIN C Take 1,000 mg by mouth daily.   aspirin  EC 81 MG tablet Commonly known as: Ecotrin Low Strength Take 1 tablet daily   B-12 500 MCG Subl Place 1 tablet under the tongue daily.   cetirizine 10 MG tablet Commonly known as: ZYRTEC Take 10 mg by mouth daily.   Cholecalciferol 250 MCG (10000 UT) Caps Take 1 capsule by mouth daily. Take one capsule daily Mon-Fri.   clopidogrel  75 MG tablet Commonly known as: Plavix  Take 1 tablet (75 mg total) by mouth daily.   Fish Oil 1000 MG Caps Take 1 capsule by mouth daily.   glimepiride  4 MG tablet Commonly known as: AMARYL  Take 1 tablet (4 mg total) by mouth daily with breakfast.   ipratropium 0.06 % nasal spray Commonly known as: ATROVENT  Use 1 to 2 sprays each nostril 2 to 3 times day as needed   Magnesium 400 MG Caps Take 1 capsule by mouth daily.   metFORMIN  500 MG 24 hr tablet Commonly known as: GLUCOPHAGE -XR Take 2 tablets (1,000 mg total) by mouth 2 (two) times daily with a meal for diabetes.   olmesartan  20 MG tablet Commonly known as: BENICAR  Take 1 tablet (20 mg total) by mouth daily.   rosuvastatin  10 MG tablet Commonly known as: Crestor  Take 1 tablet (10 mg total) by mouth daily for cholesterol   Zinc 50 MG Tabs Take by mouth daily.        Allergies  Allergen Reactions   No Known Allergies     Consultations: Neurology  Vascular Surgery   Procedures:   Discharge Exam: BP (!) 144/65   Pulse 78   Temp 98.5 F (36.9 C) (Oral)   Resp 16   Ht 6' (1.829 m)   Wt 112.9 kg   SpO2 99%   BMI 33.77 kg/m  Physical Exam Constitutional:      General: He is not in acute distress.    Appearance: Normal appearance.  HENT:     Head: Normocephalic and atraumatic.     Mouth/Throat:     Mouth: Mucous membranes are moist.  Eyes:     Extraocular Movements: Extraocular movements intact.   Cardiovascular:     Rate and Rhythm: Normal rate and regular rhythm.     Heart sounds: Murmur heard.     Systolic murmur is present with a grade of 3/6.  Pulmonary:     Effort: Pulmonary effort is normal. No respiratory distress.     Breath sounds: Normal breath sounds. No wheezing.  Abdominal:     General: Bowel sounds are normal. There is no distension.     Palpations: Abdomen is soft.     Tenderness: There is no abdominal tenderness.  Musculoskeletal:        General: Normal range of motion.     Cervical back: Normal range  of motion and neck supple.     Right lower leg: No edema.     Left lower leg: No edema.  Skin:    General: Skin is warm and dry.  Neurological:     General: No focal deficit present.     Mental Status: He is alert.  Psychiatric:        Mood and Affect: Mood normal.        Behavior: Behavior normal.      The results of significant diagnostics from this hospitalization (including imaging, microbiology, ancillary and laboratory) are listed below for reference.   Microbiology: No results found for this or any previous visit (from the past 240 hours).   Labs: BNP (last 3 results) No results for input(s): "BNP" in the last 8760 hours. Basic Metabolic Panel: Recent Labs  Lab 03/31/24 1614 03/31/24 2345 04/01/24 0435  NA 139  --  139  K 5.9* 4.4 4.1  CL 106  --  107  CO2 24  --  22  GLUCOSE 133*  --  95  BUN 17  --  15  CREATININE 1.41*  --  1.20  CALCIUM  9.6  --  8.8*  MG 2.2  --   --    Liver Function Tests: Recent Labs  Lab 03/31/24 1614 04/01/24 0435  AST 24 22  ALT 22 24  ALKPHOS 30* 27*  BILITOT 0.4 0.7  PROT 7.0 6.5  ALBUMIN 3.9 3.5   No results for input(s): "LIPASE", "AMYLASE" in the last 168 hours. No results for input(s): "AMMONIA" in the last 168 hours. CBC: Recent Labs  Lab 03/31/24 1552 04/01/24 0435  WBC 9.6 9.4  HGB 14.8 14.0  HCT 43.3 42.8  MCV 92.7 92.4  PLT 295 245   Cardiac Enzymes: No results for input(s):  "CKTOTAL", "CKMB", "CKMBINDEX", "TROPONINI" in the last 168 hours. BNP: Invalid input(s): "POCBNP" CBG: Recent Labs  Lab 04/01/24 0734 04/01/24 1214  GLUCAP 111* 160*   D-Dimer Recent Labs    04/01/24 0435  DDIMER 0.28   Hgb A1c No results for input(s): "HGBA1C" in the last 72 hours. Lipid Profile No results for input(s): "CHOL", "HDL", "LDLCALC", "TRIG", "CHOLHDL", "LDLDIRECT" in the last 72 hours. Thyroid  function studies Recent Labs    03/31/24 2226  TSH 7.907*   Anemia work up No results for input(s): "VITAMINB12", "FOLATE", "FERRITIN", "TIBC", "IRON", "RETICCTPCT" in the last 72 hours. Urinalysis    Component Value Date/Time   COLORURINE YELLOW 03/14/2023 1550   APPEARANCEUR CLEAR 03/14/2023 1550   LABSPEC 1.003 03/14/2023 1550   PHURINE 6.0 03/14/2023 1550   GLUCOSEU NEGATIVE 03/14/2023 1550   HGBUR NEGATIVE 03/14/2023 1550   BILIRUBINUR negative 10/29/2020 1245   KETONESUR NEGATIVE 03/14/2023 1550   PROTEINUR NEGATIVE 03/14/2023 1550   UROBILINOGEN 0.2 10/29/2020 1245   UROBILINOGEN 1.0 02/22/2012 0112   NITRITE NEGATIVE 03/14/2023 1550   LEUKOCYTESUR NEGATIVE 03/14/2023 1550   Sepsis Labs Recent Labs  Lab 03/31/24 1552 04/01/24 0435  WBC 9.6 9.4   Microbiology No results found for this or any previous visit (from the past 240 hours).  Procedures/Studies: ECHOCARDIOGRAM COMPLETE Result Date: 04/01/2024    ECHOCARDIOGRAM REPORT   Patient Name:   Joshua Koch Date of Exam: 04/01/2024 Medical Rec #:  [id number]     Height:       72.0 in Accession #:    [id number]    Weight:       249.0 lb Date of Birth:  [birth year]  BSA:          2.339 m Patient Age:    77 years      BP:           140/52 mmHg Patient Gender: M             HR:           75 bpm. Exam Location:  Inpatient Procedure: 2D Echo, Cardiac Doppler and Color Doppler (Both Spectral and Color            Flow Doppler were utilized during procedure). Indications:    Syncope  History:        Patient has  no prior history of Echocardiogram examinations.                 Risk Factors:Hypertension, Diabetes and HLD.  Sonographer:    Willey Harrier Referring Phys: [id number] SARA-MAIZ A Jonatan IMPRESSIONS  1. Left ventricular ejection fraction, by estimation, is 60 to 65%. The left ventricle has normal function. The left ventricle has no regional wall motion abnormalities. Left ventricular diastolic parameters were normal.  2. Right ventricular systolic function is normal. The right ventricular size is normal. Tricuspid regurgitation signal is inadequate for assessing PA pressure.  3. The mitral valve is degenerative. No evidence of mitral valve regurgitation. No evidence of mitral stenosis.  4. The aortic valve is calcified. There is severe calcifcation of the aortic valve. There is severe thickening of the aortic valve. Aortic valve regurgitation is mild. Mild aortic valve stenosis. Aortic valve area, by VTI measures 1.82 cm. Aortic valve  mean gradient measures 11.0 mmHg. Aortic valve Vmax measures 2.15 m/s.  5. The inferior vena cava is normal in size with greater than 50% respiratory variability, suggesting right atrial pressure of 3 mmHg. FINDINGS  Left Ventricle: Left ventricular ejection fraction, by estimation, is 60 to 65%. The left ventricle has normal function. The left ventricle has no regional wall motion abnormalities. The left ventricular internal cavity size was normal in size. There is  no left ventricular hypertrophy. Left ventricular diastolic parameters were normal. Normal left ventricular filling pressure. Right Ventricle: The right ventricular size is normal. No increase in right ventricular wall thickness. Right ventricular systolic function is normal. Tricuspid regurgitation signal is inadequate for assessing PA pressure. Left Atrium: Left atrial size was normal in size. Right Atrium: Right atrial size was normal in size. Pericardium: There is no evidence of pericardial effusion. Mitral Valve: The  mitral valve is degenerative in appearance. There is mild calcification of the mitral valve leaflet(s). Mild mitral annular calcification. No evidence of mitral valve regurgitation. No evidence of mitral valve stenosis. MV peak gradient, 3.3 mmHg. The mean mitral valve gradient is 2.0 mmHg. Tricuspid Valve: The tricuspid valve is normal in structure. Tricuspid valve regurgitation is trivial. No evidence of tricuspid stenosis. Aortic Valve: The aortic valve is calcified. There is severe calcifcation of the aortic valve. There is severe thickening of the aortic valve. Aortic valve regurgitation is mild. Mild aortic stenosis is present. Aortic valve mean gradient measures 11.0 mmHg. Aortic valve peak gradient measures 18.5 mmHg. Aortic valve area, by VTI measures 1.82 cm. Pulmonic Valve: The pulmonic valve was normal in structure. Pulmonic valve regurgitation is trivial. No evidence of pulmonic stenosis. Aorta: The aortic root is normal in size and structure. Venous: The inferior vena cava is normal in size with greater than 50% respiratory variability, suggesting right atrial pressure of 3 mmHg. IAS/Shunts: No atrial level shunt detected by  color flow Doppler.  LEFT VENTRICLE PLAX 2D LVIDd:         4.60 cm   Diastology LVIDs:         3.20 cm   LV e' medial:    7.72 cm/s LV PW:         1.10 cm   LV E/e' medial:  8.8 LV IVS:        1.10 cm   LV e' lateral:   9.90 cm/s LVOT diam:     2.20 cm   LV E/e' lateral: 6.9 LV SV:         92 LV SV Index:   39 LVOT Area:     3.80 cm  RIGHT VENTRICLE            IVC RV Basal diam:  3.80 cm    IVC diam: 1.80 cm RV S prime:     9.57 cm/s TAPSE (M-mode): 1.8 cm LEFT ATRIUM             Index        RIGHT ATRIUM           Index LA Vol (A2C):   39.9 ml 17.06 ml/m  RA Area:     17.50 cm LA Vol (A4C):   27.6 ml 11.80 ml/m  RA Volume:   47.30 ml  20.23 ml/m LA Biplane Vol: 33.1 ml 14.15 ml/m  AORTIC VALVE AV Area (Vmax):    1.84 cm AV Area (Vmean):   1.84 cm AV Area (VTI):     1.82  cm AV Vmax:           215.00 cm/s AV Vmean:          161.000 cm/s AV VTI:            0.505 m AV Peak Grad:      18.5 mmHg AV Mean Grad:      11.0 mmHg LVOT Vmax:         104.00 cm/s LVOT Vmean:        78.100 cm/s LVOT VTI:          0.241 m LVOT/AV VTI ratio: 0.48  AORTA Ao Root diam: 3.40 cm Ao Asc diam:  3.70 cm MITRAL VALVE MV Area (PHT): 3.06 cm    SHUNTS MV Area VTI:   3.72 cm    Systemic VTI:  0.24 m MV Peak grad:  3.3 mmHg    Systemic Diam: 2.20 cm MV Mean grad:  2.0 mmHg MV Vmax:       0.91 m/s MV Vmean:      62.2 cm/s MV Decel Time: 248 msec MV E velocity: 67.90 cm/s MV A velocity: 89.50 cm/s MV E/A ratio:  0.76 Gaylyn Keas MD Electronically signed by Gaylyn Keas MD Signature Date/Time: 04/01/2024/11:18:48 AM    Final    CT CHEST WO CONTRAST Result Date: 04/01/2024 CLINICAL DATA:  Dyspnea. EXAM: CT CHEST WITHOUT CONTRAST TECHNIQUE: Multidetector CT imaging of the chest was performed following the standard protocol without IV contrast. RADIATION DOSE REDUCTION: This exam was performed according to the departmental dose-optimization program which includes automated exposure control, adjustment of the mA and/or kV according to patient size and/or use of iterative reconstruction technique. COMPARISON:  CT angio chest from 08/16/2017 FINDINGS: Cardiovascular: Heart size appears within normal limits. Aortic atherosclerosis with coronary artery calcifications. No pericardial effusion. Mediastinum/Nodes: No enlarged mediastinal or axillary lymph nodes. Thyroid  gland, trachea, and esophagus demonstrate no significant findings. Lungs/Pleura: The central airways appear  patent. No pleural effusion, airspace consolidation, atelectasis or pneumothorax. Mild diffuse bronchial wall thickening. Mild scarring within the right middle lobe. Upper Abdomen: No acute abnormality. Exophytic cyst off the upper pole of left kidney measures 2 cm no follow-up imaging recommended. Musculoskeletal: No chest wall mass or suspicious  bone lesions identified. IMPRESSION: 1. No acute cardiopulmonary abnormalities. 2. Mild diffuse bronchial wall thickening. 3. Coronary artery calcifications. 4.  Aortic Atherosclerosis (ICD10-I70.0). Electronically Signed   By: Kimberley Penman M.D.   On: 04/01/2024 08:02   MR BRAIN WO CONTRAST Result Date: 04/01/2024 CLINICAL DATA:  Syncope/presyncope with cerebrovascular cause suspected EXAM: MRI HEAD WITHOUT CONTRAST TECHNIQUE: Multiplanar, multiecho pulse sequences of the brain and surrounding structures were obtained without intravenous contrast. COMPARISON:  CTA of the head neck from earlier today FINDINGS: Brain: No acute infarction, hemorrhage, hydrocephalus, extra-axial collection or mass lesion. Generalized atrophy which is mild for age. Chronic white matter disease to a limited extent, likely chronic microvascular ischemia. T2 hyperintensity in the anterior temporal white matter is likely related to the same, seen on both sides. 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 No generalized chronic hemorrhagic injury. Vascular: Abnormal right ICA flow void correlating with prior CTA with underfilling/collapse of the right ICA in the upper neck. Skull and upper cervical spine: Incidental hemangioma in the upper left calvarium. No significant marrow signal abnormality Sinuses/Orbits: Likely secretions in the left sphenoid sinus without superimposed mucosal thickening. Negative orbits. IMPRESSION: No acute infarct associated with the right carotid occlusion. Mild atrophy and chronic white matter disease for age. Electronically Signed   By: Ronnette Coke M.D.   On: 04/01/2024 04:26   CT ANGIO HEAD NECK W WO CM Result Date: 04/01/2024 CLINICAL DATA:  Syncope EXAM: CT ANGIOGRAPHY HEAD AND NECK WITH AND WITHOUT CONTRAST TECHNIQUE: Multidetector CT imaging of the head and neck was performed using the standard protocol during bolus administration of  intravenous contrast. Multiplanar CT image reconstructions and MIPs were obtained to evaluate the vascular anatomy. Carotid stenosis measurements (when applicable) are obtained utilizing NASCET criteria, using the distal internal carotid diameter as the denominator. RADIATION DOSE REDUCTION: This exam was performed according to the departmental dose-optimization program which includes automated exposure control, adjustment of the mA and/or kV according to patient size and/or use of iterative reconstruction technique. CONTRAST:  75mL OMNIPAQUE  IOHEXOL  350 MG/ML SOLN COMPARISON:  None Available. FINDINGS: CT HEAD FINDINGS Brain: No mass,hemorrhage or extra-axial collection. Normal appearance of the parenchyma and CSF spaces. Vascular: No hyperdense vessel or unexpected vascular calcification. Skull: The visualized skull base, calvarium and extracranial soft tissues are normal. Sinuses/Orbits: Retained secretions in the left sphenoid sinus. Normal orbits. CTA NECK FINDINGS Skeleton: No acute abnormality or high grade bony spinal canal stenosis. Other neck: Normal pharynx, larynx and major salivary glands. No cervical lymphadenopathy. Unremarkable thyroid  gland. Upper chest: No pneumothorax or pleural effusion. No nodules or masses. Aortic arch: There is calcific atherosclerosis of the aortic arch. Conventional 3 vessel aortic branching pattern. RIGHT carotid system: Diffuse narrowing of the left ICA beginning just distal to the bifurcation and extending to the skull base. LEFT carotid system: No dissection, occlusion or aneurysm. There is mixed density atherosclerosis extending into the proximal ICA, resulting in less than 50% stenosis. Vertebral arteries: Right dominant configuration. There is no dissection, occlusion or flow-limiting stenosis to the skull base (V1-V3 segments). CTA HEAD FINDINGS POSTERIOR CIRCULATION: Vertebral arteries are normal. No proximal occlusion of the anterior or inferior cerebellar  arteries.  Basilar artery is normal. Superior cerebellar arteries are normal. Posterior cerebral arteries are normal. ANTERIOR CIRCULATION: Atherosclerotic calcification of the internal carotid arteries with severe stenosis of the bilateral cavernous segments. The right carotid terminus is occluded. Anterior cerebral arteries are normal. Middle cerebral arteries are normal. Venous sinuses: As permitted by contrast timing, patent. Anatomic variants: None Review of the MIP images confirms the above findings. IMPRESSION: 1. Occlusion of the right carotid terminus. 2. Diffuse narrowing of the left ICA beginning just distal to the bifurcation and extending to the skull base, likely due to chronic atherosclerotic disease. 3. 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 Aortic Atherosclerosis (ICD10-I70.0). These results were called by telephone at the time of interpretation on 04/01/2024 at 1:01 am to provider [facility] , who verbally acknowledged these results. Electronically Signed   By: Juanetta Nordmann M.D.   On: 04/01/2024 01:01   DG Chest 2 View Result Date: 03/31/2024 CLINICAL DATA:  Chest pain EXAM: CHEST - 2 VIEW COMPARISON:  08/16/2017 FINDINGS: The heart size and mediastinal contours are within normal limits. Both lungs are clear. The visualized skeletal structures are unremarkable. IMPRESSION: No active cardiopulmonary disease. Electronically Signed   By: Rozell Cornet M.D.   On: 03/31/2024 17:07     Time coordinating discharge: Over 30 minutes    Faith Homes, MD  [facility] 04/01/2024, 6:19 PM

## 2024-04-01 NOTE — Care Management Obs Status (Signed)
 MEDICARE OBSERVATION STATUS NOTIFICATION   Patient Details  Name: CHALMER ZHENG MRN: [id number] Date of Birth: 03-17-1946   Medicare Observation Status Notification Given:  Yes    Allessandra Bernardi, RN 04/01/2024, 5:19 PM

## 2024-04-01 NOTE — Evaluation (Signed)
 Physical Therapy Evaluation and Discharge  Patient Details Name: Joshua Koch MRN: [id number] DOB: 08/21/1946 Today's Date: 04/01/2024  History of Present Illness  Patient is a 78 year old male with several episodes of near syncope while sitting. History of DM2, HTN, HLD, kidney stones, GERD.  Clinical Impression  Patient is agreeable to PT evaluation. He is independent with walking and mobility at baseline with chronic back pain reported. He lives with his spouse.  Today the patient is modified independent with bed mobility and transfers. Hallway ambulation is stable with turns and gait acceleration. No dizziness reported with activity with orthostatic vitals negative. The patient is likely at his baseline level of functional independence. No apparent acute PT needs at this time. PT will sign off.        If plan is discharge home, recommend the following: Assist for transportation   Can travel by private vehicle        Equipment Recommendations None recommended by PT  Recommendations for Other Services       Functional Status Assessment Patient has not had a recent decline in their functional status     Precautions / Restrictions Precautions Precautions:  (low fall risk) Recall of Precautions/Restrictions: Intact Restrictions Weight Bearing Restrictions Per Provider Order: No      Mobility  Bed Mobility Overal bed mobility: Modified Independent             General bed mobility comments: increased time    Transfers Overall transfer level: Modified independent                 General transfer comment: 2 standing bouts performed with no physical assistance required    Ambulation/Gait Ambulation/Gait assistance: Supervision, Modified independent (Device/Increase time) Gait Distance (Feet): 120 Feet Assistive device: None Gait Pattern/deviations: Step-through pattern       General Gait Details: steady with turns, stops, and acceleration. no dizziness  reported with mobility  Stairs            Wheelchair Mobility     Tilt Bed    Modified Rankin (Stroke Patients Only)       Balance Overall balance assessment: Needs assistance Sitting-balance support: Feet supported Sitting balance-Leahy Scale: Good     Standing balance support: No upper extremity supported Standing balance-Leahy Scale: Good               High level balance activites: Direction changes, Turns High Level Balance Comments: no loss of balance with dynamic standing activity             Pertinent Vitals/Pain Pain Assessment Pain Assessment: Faces Faces Pain Scale: Hurts a little bit Pain Location: chronic back pain Pain Descriptors / Indicators: Discomfort Pain Intervention(s): Monitored during session    Home Living Family/patient expects to be discharged to:: Private residence Living Arrangements: Spouse/significant other Available Help at Discharge: Family Type of Home: House Home Access: Stairs to enter   Secretary/administrator of Steps: 2   Home Layout: One level (walk out basement, not required for daily use) Home Equipment: Shower seat;Grab bars - tub/shower;Hand held shower head Additional Comments: one fall reported in January while navigating the steps    Prior Function Prior Level of Function : Independent/Modified Independent;Driving             Mobility Comments: independent without device. increased fatigue recently ADLs Comments: independent     Extremity/Trunk Assessment   Upper Extremity Assessment Upper Extremity Assessment: Defer to OT evaluation    Lower Extremity  Assessment Lower Extremity Assessment: LLE deficits/detail;RLE deficits/detail RLE Deficits / Details: WFL for functional tasks RLE Sensation:  (chronic numbness reported in feet) LLE Deficits / Details: WFL for functional tasks LLE Sensation:  (chronic numbness reported in feet)       Communication   Communication Communication: No  apparent difficulties    Cognition Arousal: Alert Behavior During Therapy: WFL for tasks assessed/performed   PT - Cognitive impairments: No apparent impairments                         Following commands: Intact       Cueing Cueing Techniques: Verbal cues     General Comments General comments (skin integrity, edema, etc.): orthostatic vitals taken during session (negative). no dizziness reported with mobility    Exercises     Assessment/Plan    PT Assessment Patient does not need any further PT services  PT Problem List         PT Treatment Interventions      PT Goals (Current goals can be found in the Care Plan section)  Acute Rehab PT Goals PT Goal Formulation: All assessment and education complete, DC therapy    Frequency       Co-evaluation               AM-PAC PT "6 Clicks" Mobility  Outcome Measure Help needed turning from your back to your side while in a flat bed without using bedrails?: None Help needed moving from lying on your back to sitting on the side of a flat bed without using bedrails?: None Help needed moving to and from a bed to a chair (including a wheelchair)?: None Help needed standing up from a chair using your arms (e.g., wheelchair or bedside chair)?: None Help needed to walk in hospital room?: None Help needed climbing 3-5 steps with a railing? : A Little 6 Click Score: 23    End of Session   Activity Tolerance: Patient tolerated treatment well Patient left: in chair;with call bell/phone within reach;with family/visitor present   PT Visit Diagnosis: Muscle weakness (generalized) (M62.81)    Time: 9147-8295 PT Time Calculation (min) (ACUTE ONLY): 10 min   Charges:   PT Evaluation $PT Eval Low Complexity: 1 Low   PT General Charges $$ ACUTE PT VISIT: 1 Visit         Ozie Bo, PT, MPT   Erlene Hawks 04/01/2024, 12:05 PM

## 2024-04-01 NOTE — Evaluation (Signed)
 Occupational Therapy Evaluation Patient Details Name: Joshua Koch MRN: [id number] DOB: Feb 15, 1946 Today's Date: 04/01/2024   History of Present Illness   Patient is a 78 year old male with several episodes of near syncope while sitting. History of DM2, HTN, HLD, kidney stones, GERD.     Clinical Impressions PTA patient independent ADLs, IADls and driving. Admitted for above and presents at baseline at independent level for Adls and functional mobility.  Negative orthostatics and no signs/symptoms during session related to near syncope.  Pt will good awareness of safety.  No further OT needs identified acutely. OT will sign off.      If plan is discharge home, recommend the following:         Functional Status Assessment   Patient has not had a recent decline in their functional status     Equipment Recommendations   None recommended by OT     Recommendations for Other Services         Precautions/Restrictions   Precautions Precautions:  (low fall risk) Recall of Precautions/Restrictions: Intact Restrictions Weight Bearing Restrictions Per Provider Order: No     Mobility Bed Mobility Overal bed mobility: Modified Independent             General bed mobility comments: increased time    Transfers Overall transfer level: Independent                        Balance Overall balance assessment: Needs assistance Sitting-balance support: Feet supported Sitting balance-Leahy Scale: Good     Standing balance support: No upper extremity supported Standing balance-Leahy Scale: Good                             ADL either performed or assessed with clinical judgement   ADL Overall ADL's : Independent                                             Vision   Vision Assessment?: No apparent visual deficits     Perception         Praxis         Pertinent Vitals/Pain Pain Assessment Pain Assessment:  Faces Faces Pain Scale: Hurts a little bit Pain Location: chronic back pain Pain Descriptors / Indicators: Discomfort Pain Intervention(s): Limited activity within patient's tolerance, Monitored during session, Repositioned     Extremity/Trunk Assessment Upper Extremity Assessment Upper Extremity Assessment: Overall WFL for tasks assessed   Lower Extremity Assessment Lower Extremity Assessment: LLE deficits/detail;RLE deficits/detail RLE Deficits / Details: WFL for functional tasks RLE Sensation:  (chronic numbness reported in feet) LLE Deficits / Details: WFL for functional tasks LLE Sensation:  (chronic numbness reported in feet)       Communication Communication Communication: No apparent difficulties   Cognition Arousal: Alert Behavior During Therapy: WFL for tasks assessed/performed Cognition: No apparent impairments                               Following commands: Intact       Cueing  General Comments   Cueing Techniques: Verbal cues  orthostatics negative, no signs/symptoms noted   Exercises     Shoulder Instructions      Home Living Family/patient expects to be discharged to::  Private residence Living Arrangements: Spouse/significant other Available Help at Discharge: Family Type of Home: House Home Access: Stairs to enter Secretary/administrator of Steps: 2   Home Layout: One level (walk out basement, not required for daily use)     Bathroom Shower/Tub: Producer, television/film/video: Standard     Home Equipment: Shower seat;Grab bars - tub/shower;Hand held shower head   Additional Comments: one fall reported in January while navigating the steps      Prior Functioning/Environment Prior Level of Function : Independent/Modified Independent;Driving             Mobility Comments: independent without device. increased fatigue recently ADLs Comments: independent    OT Problem List:     OT Treatment/Interventions:         OT Goals(Current goals can be found in the care plan section)   Acute Rehab OT Goals Patient Stated Goal: figure out what is going on OT Goal Formulation: With patient   OT Frequency:       Co-evaluation              AM-PAC OT "6 Clicks" Daily Activity     Outcome Measure Help from another person eating meals?: None Help from another person taking care of personal grooming?: None Help from another person toileting, which includes using toliet, bedpan, or urinal?: None Help from another person bathing (including washing, rinsing, drying)?: None Help from another person to put on and taking off regular upper body clothing?: None Help from another person to put on and taking off regular lower body clothing?: None 6 Click Score: 24   End of Session Nurse Communication: Mobility status  Activity Tolerance: Patient tolerated treatment well Patient left: in chair;with call bell/phone within reach;with family/visitor present  OT Visit Diagnosis: Other abnormalities of gait and mobility (R26.89)                Time: 8657-8469 OT Time Calculation (min): 11 min Charges:  OT General Charges $OT Visit: 1 Visit OT Evaluation $OT Eval Low Complexity: 1 Low  Bary Boss, OT Acute Rehabilitation Services Office [phone] Secure Chat Preferred    Fredrich Jefferson 04/01/2024, 12:15 PM

## 2024-04-01 NOTE — H&P (Addendum)
 History and Physical    HILLMAN ATTIG HYQ:[id number] DOB: March 28, 1946 DOA: 03/31/2024  PCP: Pcp, No  Patient coming from: home  I have personally briefly reviewed patient's old medical records in [facility]  Chief Complaint: recurrent episode of presyncope  HPI: Joshua Koch is a 78 y.o. male with medical history significant of  Asthma, DMII, GERD, HA,HLD, HTN,CLBP,OSA, CRI,who presents to ED with complaint of recurrent episodes of near syncope over the last 2 weeks.  Patient initial on admit had complaint of chest pain but currently denies. He also denies fever/chillls/ n/v/d/ diarrhea or abdominal pain. He does note intermittent DOE but notes this is chronic.  He currently states he feels at his baseline.  ED Course:  Vitals: Afeb, BP 169/65, hr 77-56, rr 18 sat 96%  EKG: nsr with marked sinus arrhythmia 1st degree AV block Labs Wbc 9.6, hgb 14.8, plt 295,  CE6,  NA 139, K 5.9, bicarb 24,  cr 1.41  Mag 2.2 Cxr: NAD Tsh 7.9  CTA IMPRESSION: 1. Occlusion of the right carotid terminus. 2. Diffuse narrowing of the left ICA beginning just distal to the bifurcation and extending to the skull base, likely due to chronic atherosclerotic disease. 3. 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  Case was discussed with Vascular a well as neurology - currently awaiting final recs.   Patient of note in ED with noted non-focal neuro exam and no recurrence of preysncope   Review of Systems: As per HPI otherwise 10 point review of systems negative.   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   Dyspnea    Erectile dysfunction    GERD (gastroesophageal reflux disease)    Headache    Heart murmur    History of kidney stones    Hypercholesteremia     Hyperlipemia    Hypertension    Kidney stone    Low back pain    Obesity    Rotator cuff dysfunction    partial tear , left shoulder   Sleep apnea     Past Surgical History:  Procedure Laterality Date   COLONOSCOPY     KNEE ARTHROSCOPY     PILONIDAL CYST EXCISION     from base of spine   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 He has never used smokeless tobacco. He reports current alcohol use of about 1.0 standard drink of alcohol per week. He reports that he does not use drugs.  Allergies  Allergen Reactions   No Known Allergies     Family History  Problem Relation Age of Onset   Heart attack Father 55       MI   Lung cancer Sister        smoker   COPD Brother    CAD Brother 63   Stroke Brother     Prior to Admission medications   Medication Sig Start Date End Date Taking? Authorizing Provider  Ascorbic Acid (VITAMIN C PO) Take by mouth. 1 tablet daily    [provider]  aspirin  (ECOTRIN LOW STRENGTH) 81  MG EC tablet Take 1 tablet daily 09/06/18   Vangie Genet, MD  benzonatate  (TESSALON ) 100 MG capsule Take two capsules (200 mg dose) by mouth 3 (three) times a day as needed for Cough. Patient not taking: Reported on 04/01/2024 01/23/24     cetirizine (ZYRTEC) 10 MG tablet Take 10 mg by mouth daily.    [provider]  CHOLECALCIFEROL PO Take 10,000 Units by mouth daily.    [provider]  Cyanocobalamin (B-12) 500 MCG SUBL Place 1 tablet under the tongue daily.    [provider]  glimepiride  (AMARYL ) 4 MG tablet Take 1 tablet (4 mg total) by mouth daily with breakfast. 07/12/23   Cranford, Tonya, NP  ipratropium (ATROVENT ) 0.06 % nasal spray Use 1 to 2 sprays each nostril 2 to 3 times day as needed 12/17/23   Vangie Genet, MD  Magnesium 400 MG CAPS Take 1  capsule by mouth 2 (two) times daily.    [provider]  metFORMIN  (GLUCOPHAGE -XR) 500 MG 24 hr tablet Take 2 tablets (1,000 mg total) by mouth 2 (two) times daily with a meal for diabetes. 05/06/23   Vangie Genet, MD  olmesartan  (BENICAR ) 20 MG tablet Take 1 tablet (20 mg total) by mouth daily. 10/23/23   Wilkinson, Dana E, FNP  Omega-3 Fatty Acids (FISH OIL PO) Take by mouth 2 (two) times daily.    [provider]  rosuvastatin  (CRESTOR ) 10 MG tablet Take 1 tablet (10 mg total) by mouth daily for cholesterol 06/24/23   Vangie Genet, MD  tirzepatide  (MOUNJARO ) 2.5 MG/0.5ML Pen Inject 1 pen (2.5 mg) into Skin every 7 days for Diabetes  ( E11.29) 12/23/23   Vangie Genet, MD  Zinc 50 MG TABS Take by mouth daily.    [provider]    Physical Exam: Vitals:   03/31/24 2245 03/31/24 2300 03/31/24 2315 03/31/24 2345  BP: 137/61 (!) 130/56 (!) 141/61 (!) 148/133  Pulse: (!) 57 (!) 57 (!) 58 (!) 56  Resp: 11 16 17    Temp:      SpO2: 100% 100% 99% 100%  Weight:      Height:        Constitutional: NAD, calm, comfortable Vitals:   03/31/24 2245 03/31/24 2300 03/31/24 2315 03/31/24 2345  BP: 137/61 (!) 130/56 (!) 141/61 (!) 148/133  Pulse: (!) 57 (!) 57 (!) 58 (!) 56  Resp: 11 16 17    Temp:      SpO2: 100% 100% 99% 100%  Weight:      Height:       Eyes: PERRL, lids and conjunctivae normal ENMT: Mucous membranes are moist. Posterior pharynx clear of any exudate or lesions.Normal dentition.  Neck: normal, supple, no masses, no thyromegaly Respiratory: clear to auscultation bilaterally, no wheezing, no crackles. Normal respiratory effort. No accessory muscle use.  Cardiovascular: Regular rate and rhythm, no murmurs / rubs / gallops. No extremity edema. 2+ pedal pulses.  Abdomen: no tenderness, no masses palpated. No hepatosplenomegaly. Bowel sounds positive.  Musculoskeletal: no clubbing / cyanosis. No joint deformity upper and lower extremities. Good ROM,  no contractures. Normal muscle tone.  Skin: no rashes, lesions, ulcers. No induration Neurologic: CN 2-12 grossly intact. Sensation intact, DTR normal. Strength 5/5 in all 4.  Psychiatric: Normal judgment and insight. Alert and oriented x 3. Normal mood.    Labs on Admission: I have personally reviewed following labs and imaging studies  CBC: Recent Labs  Lab 03/31/24 1552  WBC 9.6  HGB 14.8  HCT 43.3  MCV 92.7  PLT 295   Basic Metabolic Panel: Recent Labs  Lab 03/31/24 1614 03/31/24 2345  NA 139  --   K 5.9* 4.4  CL 106  --   CO2 24  --   GLUCOSE 133*  --   BUN 17  --   CREATININE 1.41*  --   CALCIUM  9.6  --   MG 2.2  --    GFR: Estimated Creatinine Clearance: 57.8 mL/min (A) (by C-G formula based on SCr of 1.41 mg/dL (H)). Liver Function Tests: Recent Labs  Lab 03/31/24 1614  AST 24  ALT 22  ALKPHOS 30*  BILITOT 0.4  PROT 7.0  ALBUMIN 3.9   No results for input(s): "LIPASE", "AMYLASE" in the last 168 hours. No results for input(s): "AMMONIA" in the last 168 hours. Coagulation Profile: No results for input(s): "INR", "PROTIME" in the last 168 hours. Cardiac Enzymes: No results for input(s): "CKTOTAL", "CKMB", "CKMBINDEX", "TROPONINI" in the last 168 hours. BNP (last 3 results) No results for input(s): "PROBNP" in the last 8760 hours. HbA1C: No results for input(s): "HGBA1C" in the last 72 hours. CBG: No results for input(s): "GLUCAP" in the last 168 hours. Lipid Profile: No results for input(s): "CHOL", "HDL", "LDLCALC", "TRIG", "CHOLHDL", "LDLDIRECT" in the last 72 hours. Thyroid  Function Tests: Recent Labs    03/31/24 2226 03/31/24 2345  TSH 7.907*  --   FREET4  --  0.77   Anemia Panel: No results for input(s): "VITAMINB12", "FOLATE", "FERRITIN", "TIBC", "IRON", "RETICCTPCT" in the last 72 hours. Urine analysis:    Component Value Date/Time   COLORURINE YELLOW 03/14/2023 1550   APPEARANCEUR CLEAR 03/14/2023 1550   LABSPEC 1.003 03/14/2023  1550   PHURINE 6.0 03/14/2023 1550   GLUCOSEU NEGATIVE 03/14/2023 1550   HGBUR NEGATIVE 03/14/2023 1550   BILIRUBINUR negative 10/29/2020 1245   KETONESUR NEGATIVE 03/14/2023 1550   PROTEINUR NEGATIVE 03/14/2023 1550   UROBILINOGEN 0.2 10/29/2020 1245   UROBILINOGEN 1.0 02/22/2012 0112   NITRITE NEGATIVE 03/14/2023 1550   LEUKOCYTESUR NEGATIVE 03/14/2023 1550    Radiological Exams on Admission: CT ANGIO HEAD NECK W WO CM Result Date: 04/01/2024 CLINICAL DATA:  Syncope EXAM: CT ANGIOGRAPHY HEAD AND NECK WITH AND WITHOUT CONTRAST TECHNIQUE: Multidetector CT imaging of the head and neck was performed using the standard protocol during bolus administration of intravenous contrast. Multiplanar CT image reconstructions and MIPs were obtained to evaluate the vascular anatomy. Carotid stenosis measurements (when applicable) are obtained utilizing NASCET criteria, using the distal internal carotid diameter as the denominator. RADIATION DOSE REDUCTION: This exam was performed according to the departmental dose-optimization program which includes automated exposure control, adjustment of the mA and/or kV according to patient size and/or use of iterative reconstruction technique. CONTRAST:  75mL OMNIPAQUE  IOHEXOL  350 MG/ML SOLN COMPARISON:  None Available. FINDINGS: CT HEAD FINDINGS Brain: No mass,hemorrhage or extra-axial collection. Normal appearance of the parenchyma and CSF spaces. Vascular: No hyperdense vessel or unexpected vascular calcification. Skull: The visualized skull base, calvarium and extracranial soft tissues are normal. Sinuses/Orbits: Retained secretions in the left sphenoid sinus. Normal orbits. CTA NECK FINDINGS Skeleton: No acute abnormality or high grade bony spinal canal stenosis. Other neck: Normal pharynx, larynx and major salivary glands. No cervical lymphadenopathy. Unremarkable thyroid  gland. Upper chest: No pneumothorax or pleural effusion. No nodules or masses. Aortic arch: There is  calcific atherosclerosis of the aortic arch. Conventional 3 vessel aortic branching pattern. RIGHT carotid system: Diffuse narrowing of the left ICA beginning just distal  to the bifurcation and extending to the skull base. LEFT carotid system: No dissection, occlusion or aneurysm. There is mixed density atherosclerosis extending into the proximal ICA, resulting in less than 50% stenosis. Vertebral arteries: Right dominant configuration. There is no dissection, occlusion or flow-limiting stenosis to the skull base (V1-V3 segments). CTA HEAD FINDINGS POSTERIOR CIRCULATION: Vertebral arteries are normal. No proximal occlusion of the anterior or inferior cerebellar arteries. Basilar artery is normal. Superior cerebellar arteries are normal. Posterior cerebral arteries are normal. ANTERIOR CIRCULATION: Atherosclerotic calcification of the internal carotid arteries with severe stenosis of the bilateral cavernous segments. The right carotid terminus is occluded. Anterior cerebral arteries are normal. Middle cerebral arteries are normal. Venous sinuses: As permitted by contrast timing, patent. Anatomic variants: None Review of the MIP images confirms the above findings. IMPRESSION: 1. Occlusion of the right carotid terminus. 2. Diffuse narrowing of the left ICA beginning just distal to the bifurcation and extending to the skull base, likely due to chronic atherosclerotic disease. 3. 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 Aortic Atherosclerosis (ICD10-I70.0). These results were called by telephone at the time of interpretation on 04/01/2024 at 1:01 am to provider [facility] , who verbally acknowledged these results. Electronically Signed   By: Juanetta Nordmann M.D.   On: 04/01/2024 01:01   DG Chest 2 View Result Date: 03/31/2024 CLINICAL DATA:  Chest pain EXAM: CHEST - 2 VIEW COMPARISON:  08/16/2017 FINDINGS: The heart size and mediastinal contours are within normal limits.  Both lungs are clear. The visualized skeletal structures are unremarkable. IMPRESSION: No active cardiopulmonary disease. Electronically Signed   By: Rozell Cornet M.D.   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  -admit to progressive care  - place TIA r/o CVA protocol  - MRI, Echo in am  - antiplatelets per vascular /neurology  - monitor on neuro checks  -hold HTN medications currently  -SLP/OT/PT  Hyperkalemia - hold all supplements  - repeat labs now  -if trending up, start lokelma protocol  Asthma -no active exacerbation  -prn nebs  -resume home regimen   DOE -f/u with ct chest   DMII - ISS/FS   GERD -ppi   HA -no active issues   HLD -resume statin    HTN -hold bp medications currently   CLBP -supportive care   OSA -intolerant to cpap   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

## 2024-04-01 NOTE — Progress Notes (Addendum)
 STROKE TEAM PROGRESS NOTE   INTERIM HISTORY/SUBJECTIVE  No family at the bedside.  Patient is getting his echo at this point He has had no more near syncopal episodes MRI is negative for acute process CT angiogram shows chronic right ICA occlusion at the terminus with diffuse left ICA narrowing distal to the bifurcation to the skull base.  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 CBC    Component Value Date/Time   WBC 9.4 04/01/2024 0435   RBC 4.63 04/01/2024 0435   HGB 14.0 04/01/2024 0435   HCT 42.8 04/01/2024 0435   PLT 245 04/01/2024 0435   MCV 92.4 04/01/2024 0435   MCH 30.2 04/01/2024 0435   MCHC 32.7 04/01/2024 0435   RDW 12.9 04/01/2024 0435   LYMPHSABS 2,632 07/08/2023 1528   MONOABS 1.0 10/19/2021 0246   EOSABS 176 12/17/2023 1432   BASOSABS 56 12/17/2023 1432    BMET    Component Value Date/Time   NA 139 04/01/2024 0435   K 4.1 04/01/2024 0435   CL 107 04/01/2024 0435   CO2 22 04/01/2024 0435   GLUCOSE 95 04/01/2024 0435   BUN 15 04/01/2024 0435   CREATININE 1.20 04/01/2024 0435   CREATININE 1.37 (H) 12/17/2023 1432   CALCIUM  8.8 (L) 04/01/2024 0435   EGFR 53 (L) 12/17/2023 1432   GFRNONAA >60 04/01/2024 0435   GFRNONAA 76 05/10/2021 1231    IMAGING past 24 hours CT CHEST WO CONTRAST Result Date: 04/01/2024 CLINICAL DATA:  Dyspnea. EXAM: CT CHEST WITHOUT CONTRAST TECHNIQUE: Multidetector CT imaging of the chest was performed following the standard protocol without IV contrast. RADIATION DOSE REDUCTION: This exam was performed according to the departmental dose-optimization program which includes automated exposure control, adjustment of the mA and/or kV according to patient size and/or use of iterative reconstruction technique. COMPARISON:  CT angio chest from 08/16/2017 FINDINGS: Cardiovascular: Heart size appears within normal limits. Aortic atherosclerosis with coronary artery calcifications. No pericardial effusion.  Mediastinum/Nodes: No enlarged mediastinal or axillary lymph nodes. Thyroid  gland, trachea, and esophagus demonstrate no significant findings. Lungs/Pleura: The central airways appear patent. No pleural effusion, airspace consolidation, atelectasis or pneumothorax. Mild diffuse bronchial wall thickening. Mild scarring within the right middle lobe. Upper Abdomen: No acute abnormality. Exophytic cyst off the upper pole of left kidney measures 2 cm no follow-up imaging recommended. Musculoskeletal: No chest wall mass or suspicious bone lesions identified. IMPRESSION: 1. No acute cardiopulmonary abnormalities. 2. Mild diffuse bronchial wall thickening. 3. Coronary artery calcifications. 4.  Aortic Atherosclerosis (ICD10-I70.0). Electronically Signed   By: Kimberley Penman M.D.   On: 04/01/2024 08:02   MR BRAIN WO CONTRAST Result Date: 04/01/2024 CLINICAL DATA:  Syncope/presyncope with cerebrovascular cause suspected EXAM: MRI HEAD WITHOUT CONTRAST TECHNIQUE: Multiplanar, multiecho pulse sequences of the brain and surrounding structures were obtained without intravenous contrast. COMPARISON:  CTA of the head neck from earlier today FINDINGS: Brain: No acute infarction, hemorrhage, hydrocephalus, extra-axial collection or mass lesion. Generalized atrophy which is mild for age. Chronic white matter disease to a limited extent, likely chronic microvascular ischemia. T2 hyperintensity in the anterior temporal white matter is likely related to the same, seen on both sides. 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 No generalized chronic hemorrhagic injury. Vascular: Abnormal right ICA flow void correlating with prior CTA with underfilling/collapse of the right ICA in the upper neck. Skull and upper cervical spine: Incidental hemangioma in the upper left calvarium. No significant marrow signal abnormality Sinuses/Orbits: Likely secretions  in the left sphenoid sinus  without superimposed mucosal thickening. Negative orbits. IMPRESSION: No acute infarct associated with the right carotid occlusion. Mild atrophy and chronic white matter disease for age. Electronically Signed   By: Ronnette Coke M.D.   On: 04/01/2024 04:26   CT ANGIO HEAD NECK W WO CM Result Date: 04/01/2024 CLINICAL DATA:  Syncope EXAM: CT ANGIOGRAPHY HEAD AND NECK WITH AND WITHOUT CONTRAST TECHNIQUE: Multidetector CT imaging of the head and neck was performed using the standard protocol during bolus administration of intravenous contrast. Multiplanar CT image reconstructions and MIPs were obtained to evaluate the vascular anatomy. Carotid stenosis measurements (when applicable) are obtained utilizing NASCET criteria, using the distal internal carotid diameter as the denominator. RADIATION DOSE REDUCTION: This exam was performed according to the departmental dose-optimization program which includes automated exposure control, adjustment of the mA and/or kV according to patient size and/or use of iterative reconstruction technique. CONTRAST:  75mL OMNIPAQUE  IOHEXOL  350 MG/ML SOLN COMPARISON:  None Available. FINDINGS: CT HEAD FINDINGS Brain: No mass,hemorrhage or extra-axial collection. Normal appearance of the parenchyma and CSF spaces. Vascular: No hyperdense vessel or unexpected vascular calcification. Skull: The visualized skull base, calvarium and extracranial soft tissues are normal. Sinuses/Orbits: Retained secretions in the left sphenoid sinus. Normal orbits. CTA NECK FINDINGS Skeleton: No acute abnormality or high grade bony spinal canal stenosis. Other neck: Normal pharynx, larynx and major salivary glands. No cervical lymphadenopathy. Unremarkable thyroid  gland. Upper chest: No pneumothorax or pleural effusion. No nodules or masses. Aortic arch: There is calcific atherosclerosis of the aortic arch. Conventional 3 vessel aortic branching pattern. RIGHT carotid system: Diffuse narrowing of the left ICA  beginning just distal to the bifurcation and extending to the skull base. LEFT carotid system: No dissection, occlusion or aneurysm. There is mixed density atherosclerosis extending into the proximal ICA, resulting in less than 50% stenosis. Vertebral arteries: Right dominant configuration. There is no dissection, occlusion or flow-limiting stenosis to the skull base (V1-V3 segments). CTA HEAD FINDINGS POSTERIOR CIRCULATION: Vertebral arteries are normal. No proximal occlusion of the anterior or inferior cerebellar arteries. Basilar artery is normal. Superior cerebellar arteries are normal. Posterior cerebral arteries are normal. ANTERIOR CIRCULATION: Atherosclerotic calcification of the internal carotid arteries with severe stenosis of the bilateral cavernous segments. The right carotid terminus is occluded. Anterior cerebral arteries are normal. Middle cerebral arteries are normal. Venous sinuses: As permitted by contrast timing, patent. Anatomic variants: None Review of the MIP images confirms the above findings. IMPRESSION: 1. Occlusion of the right carotid terminus. 2. Diffuse narrowing of the left ICA beginning just distal to the bifurcation and extending to the skull base, likely due to chronic atherosclerotic disease. 3. 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 Aortic Atherosclerosis (ICD10-I70.0). These results were called by telephone at the time of interpretation on 04/01/2024 at 1:01 am to provider [facility] , who verbally acknowledged these results. Electronically Signed   By: Juanetta Nordmann M.D.   On: 04/01/2024 01:01   DG Chest 2 View Result Date: 03/31/2024 CLINICAL DATA:  Chest pain EXAM: CHEST - 2 VIEW COMPARISON:  08/16/2017 FINDINGS: The heart size and mediastinal contours are within normal limits. Both lungs are clear. The visualized skeletal structures are unremarkable. IMPRESSION: No active cardiopulmonary disease. Electronically Signed   By:  Rozell Cornet M.D.   On: 03/31/2024 17:07    Vitals:   04/01/24 0500 04/01/24 0719 04/01/24 0730 04/01/24 0838  BP: (!) 129/59 (!) 150/58 (!) 153/51 Aaron Aas)  140/52  Pulse: 72 68 72 74  Resp: 18   18  Temp: 98 F (36.7 C)   98.3 F (36.8 C)  TempSrc: Oral   Oral  SpO2: 99% 98% 98% 97%  Weight:      Height:         PHYSICAL EXAM General:  Alert, well-nourished, well-developed patient in no acute distress Psych:  Mood and affect appropriate for situation CV: Regular rate and rhythm on monitor Respiratory:  Regular, unlabored respirations on room air GI: Abdomen soft and nontender   NEURO:  Mental Status: AA&Ox3, patient is able to give clear and coherent history Speech/Language: speech is without dysarthria or aphasia.  Naming, repetition, fluency, and comprehension intact.  Cranial Nerves:  II: PERRL. Visual fields full.  III, IV, VI: EOMI. Eyelids elevate symmetrically.  V: Sensation is intact to light touch and symmetrical to face.  VII: Face is symmetrical resting and smiling VIII: hearing intact to voice. IX, X: Palate elevates symmetrically. Phonation is normal.  IO:[id number] shrug 5/5. XII: tongue is midline without fasciculations. Motor: 5/5 strength to all muscle groups tested.  Tone: is normal and bulk is normal Sensation- Intact to light touch bilaterally. Extinction absent to light touch to DSS.   Coordination: FTN intact bilaterally, HKS: no ataxia in BLE.No drift.  Gait- deferred  Most Recent NIH 0   ASSESSMENT/PLAN  Mr. Joshua Koch is a 77 y.o. male with history of DM2, HTN, HLD, kidney stones, GERD, erectile dysfunction, struct of sleep apnea who was admitted with episodes of near syncope that have been become more frequent over the last couple weeks.  He describes a prodrome of pain going from his chest to his upper back on both sides and then a rising sensation followed by some lightheadedness.  The episodes exclusively occur while he is sitting.  He  has had about 4 episodes over the course of the last 2 weeks.  He has never lost consciousness.  He denies any focal deficits during the episodes, he denies any prior history of stroke or TIA.  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  CTA head & neck Occlusion of the right carotid terminus. Diffuse narrowing of the left ICA beginning just distal to the bifurcation and extending to the skull base, likely due to chronic atherosclerotic disease. 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 MRA no acute process 2D Echo EF 60 to 65% LDL 75 HgbA1c 6.2 Recommend 30-day heart monitor VTE prophylaxis -heparin  subcu No antithrombotic prior to admission, now on aspirin  81 mg daily and clopidogrel  75 mg daily for 3 months and then aspirin  alone. Therapy recommendations:  Pending Disposition: Pending  Hypertension Home meds: Olmesartan  20 mg Stable Blood pressure goal 140-160, avoid hypotension  Hyperlipidemia Home meds: Crestor  10 mg,  resumed in hospital LDL 75, goal < 70 Continue statin at discharge  Diabetes type II Controlled Home meds: Glimepiride  4 mg, metformin  HgbA1c 6.2, goal < 7.0 CBGs SSI Recommend close follow-up with PCP for better DM control  Dysphagia Patient has post-stroke dysphagia, SLP consulted    Diet   Diet heart healthy/carb modified Room service appropriate? Yes; Fluid consistency: Thin   Advance diet as tolerated  Other Stroke Risk Factors ETOH use,, advised to drink no more  than 2 drink(s) a day Obesity, Body mass index is 33.77 kg/m., BMI >/= 30 associated with increased stroke risk, recommend weight loss, diet and exercise as appropriate  OSA    Other Active  Problems GERD CKD Asthma  Hospital day # 0   Jonette Nestle DNP, ACNPC-AG  [facility] I have personally obtained history,examined this patient, reviewed notes, independently viewed imaging studies, participated in medical decision making and plan of care.ROS completed by me personally and pertinent positives fully documented  I have made any additions or clarifications directly to the above note. Agree with note above.  Patient presented with recurrent presyncopal symptoms but denies any focal stroke or TIA like episodes.  MRI show is negative for acute or emergent finding chronic strokes.  CT angiogram shows severe bilateral cavernous carotid stenosis with terminal right ICA occlusion.  This certainly puts the patient at future risk for strokes and TIAs.  I recommend diagnostic cerebral catheter angiogram to get a more clear picture of the stenosis prior to making recommendations of whether he needs elective angioplasty stenting or not.  Patient needs to discuss this with his wife and this angiogram can be scheduled on an outpatient basis.  Recommend dual antiplatelet therapy aspirin  and Plavix  for 3 months followed by aspirin  alone and aggressive risk factor modification.  Avoid hypotension and maintain systolic blood pressure 120-140.  Long discussion with patient and answered questions.  Discussed with Dr. Gladstone Lamer Greater than 50% time during this 50-minute visit was spent in counseling and coordination of care and discussion patient care team and answering questions. Ardella Beaver, MD Medical Director [facility] Pager: [phone] 04/01/2024 3:17 PM    To contact Stroke Continuity provider, please refer to [email or web address]. After hours, contact General Neurology

## 2024-04-01 NOTE — Consult Note (Signed)
 Hospital Consult    Reason for Consult:  ica stenosis Referring Physician:  Dr. Gladstone Lamer MRN #:  [id number]  History of Present Illness: This is a 78 y.o. male without history of significant vascular disease.  States that he does have diabetes, hyperlipidemia and hypertension and has neuropathy of his feet.  He has been followed by Dr. [place] with cardiology in the past and underwent stress test but no other interventions.  More recently he has had approximately 2 weeks of lightheadedness particularly when he is sitting.  He has attempted to reach cardiology for further evaluation but was given an appointment in August and now presents to the emergency department.  He denies any localizing signs in his upper or lower extremities has not had any speech difficulty and specifically does not have any history of stroke, TIA or amaurosis.  He has no personal or family history of aneurysm disease and other than neuropathy walks without limitations specifically denies claudication.  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   Dyspnea    Erectile dysfunction    GERD (gastroesophageal reflux disease)    Headache    Heart murmur    History of kidney stones    Hypercholesteremia    Hyperlipemia    Hypertension    Kidney stone    Low back pain    Obesity    Rotator cuff dysfunction    partial tear , left shoulder   Sleep apnea     Past Surgical History:  Procedure Laterality Date   COLONOSCOPY     KNEE ARTHROSCOPY     PILONIDAL CYST EXCISION     from base of spine   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 Authorizing Provider  acetaminophen  (TYLENOL ) 500 MG tablet Take 1,000 mg by mouth every 6 (six) hours as needed for mild pain (pain score 1-3) or headache.   Yes [provider]  ascorbic acid (VITAMIN C) 1000 MG tablet Take 1,000 mg by mouth daily.   Yes [provider]  aspirin  (ECOTRIN LOW STRENGTH) 81 MG EC tablet Take 1 tablet daily 09/06/18  Yes Vangie Genet, MD  cetirizine (ZYRTEC) 10 MG tablet Take 10 mg by mouth daily.   Yes [provider]  Cholecalciferol 250 MCG (10000 UT) CAPS Take 1 capsule by mouth daily. Take one capsule daily Mon-Fri.   Yes [provider]  Cyanocobalamin (B-12) 500 MCG SUBL Place 1 tablet under the tongue daily.   Yes [provider]  glimepiride  (AMARYL ) 4 MG tablet Take 1 tablet (4 mg total) by mouth daily with breakfast. 07/12/23  Yes Cranford, Tonya, NP  ipratropium (ATROVENT ) 0.06 % nasal spray Use 1 to 2 sprays each nostril 2 to 3 times day as needed 12/17/23  Yes Vangie Genet, MD  Magnesium 400 MG CAPS Take 1 capsule by mouth daily.   Yes [provider]  metFORMIN  (GLUCOPHAGE -XR) 500 MG 24 hr tablet Take 2 tablets (1,000 mg total) by mouth 2 (two) times  daily with a meal for diabetes. 05/06/23  Yes Vangie Genet, MD  olmesartan  (BENICAR ) 20 MG tablet Take 1 tablet (20 mg total) by mouth daily. 10/23/23  Yes Wilkinson, Dana E, FNP  Omega-3 Fatty Acids (FISH OIL) 1000 MG CAPS Take 1 capsule by mouth daily.   Yes [provider]  rosuvastatin  (CRESTOR ) 10 MG tablet Take 1 tablet (10 mg total) by mouth daily for cholesterol 06/24/23  Yes Vangie Genet, MD  Zinc 50 MG TABS Take by mouth daily.   Yes [provider]    Social History   Socioeconomic History   Marital status: Married    Spouse name: Not on file   Number of children: 2   Years of education: Not on file   Highest education level: Not on file   Occupational History   Occupation: makes cutting dyes  Tobacco Use   Smoking status: Never   Smokeless tobacco: Never  Vaping Use   Vaping status: Never Used  Substance and Sexual Activity   Alcohol use: Yes    Alcohol/week: 1.0 standard drink of alcohol    Types: 1 Cans of beer per week    Comment: occasional   Drug use: No   Sexual activity: Not Currently    Partners: Female    Birth control/protection: Post-menopausal    Comment: hasn't been active in several years  Other Topics Concern   Not on file  Social History Narrative   ** Merged History Encounter **       Social Drivers of Corporate investment banker Strain: Not on file  Food Insecurity: Not on file  Transportation Needs: Not on file  Physical Activity: Not on file  Stress: Not on file  Social Connections: Not on file  Intimate Partner Violence: Not on file     Family History  Problem Relation Age of Onset   Heart attack Father 74       MI   Lung cancer Sister        smoker   COPD Brother    CAD Brother 84   Stroke Brother     Review of Systems  Constitutional: Negative.   HENT: Negative.    Eyes: Negative.   Respiratory: Negative.    Cardiovascular: Negative.   Musculoskeletal: Negative.   Skin: Negative.   Neurological:  Positive for dizziness.  Endo/Heme/Allergies: Negative.   Psychiatric/Behavioral: Negative.        Physical Examination  Vitals:   04/01/24 0719 04/01/24 0730  BP: (!) 150/58 (!) 153/51  Pulse: 68 72  Resp:    Temp:    SpO2: 98% 98%   Body mass index is 33.77 kg/m.  Physical Exam HENT:     Head: Normocephalic.  Cardiovascular:     Rate and Rhythm: Normal rate.  Abdominal:     Palpations: Abdomen is soft.     Tenderness: There is no guarding.  Skin:    General: Skin is warm.     Capillary Refill: Capillary refill takes less than 2 seconds.  Neurological:     General: No focal deficit present.     Mental Status: He is alert.  Psychiatric:         Mood and Affect: Mood normal.      CBC    Component Value Date/Time   WBC 9.4 04/01/2024 0435   RBC 4.63 04/01/2024 0435   HGB 14.0 04/01/2024 0435   HCT 42.8 04/01/2024 0435   PLT 245 04/01/2024 0435   MCV  92.4 04/01/2024 0435   MCH 30.2 04/01/2024 0435   MCHC 32.7 04/01/2024 0435   RDW 12.9 04/01/2024 0435   LYMPHSABS 2,632 07/08/2023 1528   MONOABS 1.0 10/19/2021 0246   EOSABS 176 12/17/2023 1432   BASOSABS 56 12/17/2023 1432    BMET    Component Value Date/Time   NA 139 04/01/2024 0435   K 4.1 04/01/2024 0435   CL 107 04/01/2024 0435   CO2 22 04/01/2024 0435   GLUCOSE 95 04/01/2024 0435   BUN 15 04/01/2024 0435   CREATININE 1.20 04/01/2024 0435   CREATININE 1.37 (H) 12/17/2023 1432   CALCIUM  8.8 (L) 04/01/2024 0435   GFRNONAA >60 04/01/2024 0435   GFRNONAA 76 05/10/2021 1231   GFRAA 88 05/10/2021 1231    COAGS: Lab Results  Component Value Date   INR 0.98 10/15/2017   INR 1.04 08/16/2017     Non-Invasive Vascular Imaging:   CTA IMPRESSION: 1. Occlusion of the right carotid terminus. 2. Diffuse narrowing of the left ICA beginning just distal to the bifurcation and extending to the skull base, likely due to chronic atherosclerotic disease. 3. 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   Aortic Atherosclerosis (ICD10-I70.0).    ASSESSMENT/PLAN: This is a 78 y.o. male with multifocal disease of his carotid arteries with occlusion of the right carotid terminus and severe disease of the left ICA including just past the bifurcation.  Disease all appears chronic based on CT and likely best managed medically.  Neurology input much appreciated. I will follow along as needed.  Laterica Matarazzo C. Vikki Graves, MD [facility] Office: [phone] Pager: [phone]

## 2024-04-01 NOTE — Care Management CC44 (Signed)
 Condition Code 44 Documentation Completed  Patient Details  Name: Joshua Koch MRN: [id number] Date of Birth: 07-Nov-1946   Condition Code 44 given:  Yes Patient signature on Condition Code 44 notice:  Yes Documentation of 2 MD's agreement:  Yes Code 44 added to claim:  Yes    Laverne Klugh, RN 04/01/2024, 5:19 PM

## 2024-04-01 NOTE — Hospital Course (Addendum)
 Joshua Koch is a 78 y.o. male with medical history significant of HTN, DM II, HLD, obesity, sleep apnea who presented with complaints of dizziness/near syncope for the last few weeks.  His symptoms typically all occurred when he was at rest and sitting down. He was finally encouraged to come to the ER for further workup. He has also been having some intermittent hyperkalemia outpatient which has been monitored and seems to self resolve. He again was noted to have some hyperkalemia on admission, 5.9 with spontaneous resolution without any fluid or interventions.  Creatinine was also mildly elevated on admission, 1.41 which also returned to normal without any fluid resuscitation.  Multiple imaging studies were performed for presyncope workup.  CT angio head/neck showed occlusion of the right carotid terminus and diffuse narrowing of the left ICA along with severe stenosis of bilateral cavernous segments of the internal carotid arteries.  MRI brain negative for acute infarcts.  Neurology and vascular surgery were consulted as well.  Echo was also obtained which showed normal EF, 60 to 65%, no RWMA, normal diastolic parameters. Aortic valve was noted to be severely calcified with mild aortic regurgitation and only mild aortic valve stenosis. Case was discussed with neurology and cardiology further.  He plans to follow-up outpatient with both for further workups if indicated. He was already taking baby aspirin  daily at home and started on 69-month course of Plavix  per neurology recommendations.  His Crestor  dose should be clarified and further increased if able.  LDL on 12/17/2023 was 75; neurology recommendations are to try and lower LDL below 70.  Could also repeat lipid panel at follow-up first prior to medication changes.

## 2024-04-01 NOTE — Evaluation (Signed)
 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   Dyspnea    Erectile dysfunction    GERD (gastroesophageal reflux disease)    Headache    Heart murmur    History of kidney stones    Hypercholesteremia    Hyperlipemia    Hypertension    Kidney stone    Low back pain    Obesity    Rotator cuff dysfunction    partial tear , left shoulder   Sleep apnea    Past Surgical History:  Past Surgical History:  Procedure Laterality Date   COLONOSCOPY     KNEE ARTHROSCOPY     PILONIDAL CYST EXCISION     from base of spine   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   Procedure: RIGHT TOTAL KNEE ARTHROPLASTY;  Surgeon: Liliane Rei, MD;  Location: [facility];  Service: Orthopedics;  Laterality: Right;   HPI:  Joshua Koch is a 78 y.o. male who was admitted with episodes of near syncope that have been become more frequent over the last couple weeks. MRI and Chest CT 4/23 negative. Pt with hx of DM2, HTN, HLD, kidney stones, GERD, erectile dysfunction, struct of sleep apnea    Assessment / Plan / Recommendation  Clinical Impression  Pt presents with grossly normal swallow function.  Pt tolerated all consistencies trialed with no clinical s/s of aspiration and exhibited good oral clearance of solids. Pt appears safe to contiunue current diet and has no further ST needs. SLP will sign off at this time.   Recommend regular texture diet with thin liquids.   SLP Visit Diagnosis: Dysphagia, unspecified (R13.10)    Aspiration Risk  No  limitations    Diet Recommendation Regular;Thin liquid    Medication Administration: Whole meds with liquid Supervision: Patient able to self feed Compensations: Slow rate;Small sips/bites Postural Changes: Seated upright at 90 degrees    Other  Recommendations Oral Care Recommendations: Oral care BID    Recommendations for follow up therapy are one component of a multi-disciplinary discharge planning process, led by the attending physician.  Recommendations may be updated based on patient status, additional functional criteria and insurance authorization.  Follow up Recommendations No SLP follow up      Assistance Recommended at Discharge  N/A  Functional Status Assessment Patient has not had a recent decline in their functional status  Frequency and Duration  (N/A)          Prognosis Prognosis for improved oropharyngeal function:  (N/A)      Swallow Study   General Date of Onset: 03/31/24 HPI: Joshua Koch is a 78 y.o. male who was admitted with episodes of near syncope that have been become more frequent over the last couple weeks. MRI and Chest CT 4/23 negative. Pt with hx of DM2, HTN, HLD, kidney stones, GERD, erectile dysfunction, struct of sleep apnea Type of Study: Bedside Swallow Evaluation Previous Swallow Assessment: none Diet Prior to this Study: Regular;Thin liquids (Level 0) Temperature Spikes Noted: No History of Recent  Intubation: No Behavior/Cognition: Alert;Cooperative;Pleasant mood Oral Cavity Assessment: Within Functional Limits Oral Care Completed by SLP: No Oral Cavity - Dentition: Adequate natural dentition Vision: Functional for self-feeding Self-Feeding Abilities: Able to feed self Patient Positioning: Upright in bed Baseline Vocal Quality: Normal Volitional Cough: Strong Volitional Swallow: Able to elicit    Oral/Motor/Sensory Function Overall Oral Motor/Sensory Function: Within functional limits   Ice Chips Ice chips: Not tested   Thin  Liquid Thin Liquid: Within functional limits Presentation: Straw    Nectar Thick Nectar Thick Liquid: Not tested   Honey Thick Honey Thick Liquid: Not tested   Puree Puree: Within functional limits Presentation: Self Fed   Solid     Solid: Within functional limits Presentation: Self Fed      Elester Grim, MA, CCC-SLP Acute Rehabilitation Services Office: [phone] 04/01/2024,9:07 AM

## 2024-04-01 NOTE — Discharge Instructions (Signed)
 Dr. Janett Medin does want you to be a 3 month course of aspirin  and plavix  followed by aspirin  alone afterwards. This prescription has also been sent to your pharmacy.

## 2024-04-02 ENCOUNTER — Other Ambulatory Visit: Payer: Self-pay | Admitting: Internal Medicine

## 2024-04-02 ENCOUNTER — Other Ambulatory Visit: Payer: Self-pay

## 2024-04-02 DIAGNOSIS — E1169 Type 2 diabetes mellitus with other specified complication: Secondary | ICD-10-CM

## 2024-04-03 NOTE — Progress Notes (Addendum)
 Patient ID: Joshua Koch MRN: [id number] DOB/AGE: 77-Mar-1947 77 y.o.  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 The patient is a 78 year old male with the below listed medical problems who is here for hospital follow-up.  The patient was seen in the emergency department due to syncope.  He was evaluated by neurology and vascular surgery due to a CTA which demonstrated occluded right ICA and diffuse disease of left ICA.  A head MRI was reassuring.  An echocardiogram demonstrated aortic valvular disease and he is referred for hospital follow-up.  He experiences episodes of dizziness and flushing, described as a 'rushing up' sensation from his chest to his head, leading to a near-fainting feeling. These episodes have occurred several times, including during a lunch, prompting a visit to the emergency department. No nausea or pain is associated with these episodes.  He has a history of diabetes managed with oral medications and was previously on Ozempic  for weight loss, which he discontinued due to fainting episodes. He has lost significant weight from a peak of 299.5 pounds, partly attributed to Ozempic  use.  He experiences shortness of breath with physical activity, which has slightly worsened over time. He notes occasional swelling in his left ankle but denies any significant bleeding or bruising. He does not experience breathing difficulties when lying flat.  He has a history of tinnitus, particularly noticeable when lying on one  side, and occasional swishing sounds. He wears hearing aids and reports his left ear bothers him more than the right.         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   Dyspnea    Erectile dysfunction    GERD (gastroesophageal reflux disease)    Headache    Heart murmur    History of kidney stones    Hypercholesteremia    Hyperlipemia    Hypertension    Kidney stone    Low back pain    Obesity    Rotator cuff dysfunction    partial tear , left shoulder   Sleep apnea     Past Surgical History:  Procedure Laterality Date   COLONOSCOPY     KNEE ARTHROSCOPY     PILONIDAL CYST EXCISION     from base of spine   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   Procedure: RIGHT TOTAL KNEE ARTHROPLASTY;  Surgeon: Liliane Rei, MD;  Location: [facility];  Service: Orthopedics;  Laterality: Right;    Family History  Problem Relation Age of Onset   Heart attack Father 82       MI   Lung cancer Sister        smoker   COPD Brother    CAD Brother 51   Stroke Brother     Social History   Socioeconomic History   Marital status:  Married    Spouse name: Not on file   Number of children: 2   Years of education: Not on file   Highest education level: Not on file  Occupational History   Occupation: makes cutting dyes  Tobacco Use   Smoking status: Never   Smokeless tobacco: Never  Vaping Use   Vaping status: Never Used  Substance and Sexual Activity   Alcohol use: Yes    Alcohol/week: 1.0 standard drink of alcohol    Types: 1 Cans of beer per week    Comment: occasional   Drug use: No   Sexual activity: Not Currently    Partners: Female    Birth control/protection: Post-menopausal    Comment: hasn't been active in several years  Other Topics Concern   Not on file  Social History Narrative   **  Merged History Encounter **       Social Drivers of Corporate investment banker Strain: Not on file  Food Insecurity: Not on file  Transportation Needs: Not on file  Physical Activity: Not on file  Stress: Not on file  Social Connections: Not on file  Intimate Partner Violence: Not on file     Prior to Admission medications   Medication Sig Start Date End Date Taking? Authorizing Provider  acetaminophen  (TYLENOL ) 500 MG tablet Take 1,000 mg by mouth every 6 (six) hours as needed for mild pain (pain score 1-3) or headache.    [provider]  ascorbic acid (VITAMIN C) 1000 MG tablet Take 1,000 mg by mouth daily.    [provider]  aspirin  (ECOTRIN LOW STRENGTH) 81 MG EC tablet Take 1 tablet daily 09/06/18   Vangie Genet, MD  cetirizine (ZYRTEC) 10 MG tablet Take 10 mg by mouth daily.    [provider]  Cholecalciferol 250 MCG (10000 UT) CAPS Take 1 capsule by mouth daily. Take one capsule daily Mon-Fri.    [provider]  clopidogrel  (PLAVIX ) 75 MG tablet Take 1 tablet (75 mg total) by mouth daily. 04/01/24 07/01/24  Faith Homes, MD  Cyanocobalamin (B-12) 500 MCG SUBL Place 1 tablet under the tongue daily.    [provider]  glimepiride  (AMARYL ) 4 MG tablet Take 1 tablet (4 mg total) by mouth daily with breakfast. 04/01/24   Faith Homes, MD  ipratropium (ATROVENT ) 0.06 % nasal spray Use 1 to 2 sprays each nostril 2 to 3 times day as needed 12/17/23   Vangie Genet, MD  Magnesium 400 MG CAPS Take 1 capsule by mouth daily.    [provider]  metFORMIN  (GLUCOPHAGE -XR) 500 MG 24 hr tablet Take 2 tablets (1,000 mg total) by mouth 2 (two) times daily with a meal for diabetes. 05/06/23   Vangie Genet, MD  olmesartan  (BENICAR ) 20 MG tablet Take 1 tablet (20 mg total) by mouth daily. 10/23/23   Wilkinson, Dana E, FNP  Omega-3 Fatty Acids (FISH OIL) 1000 MG CAPS Take 1 capsule by mouth daily.    [provider]   rosuvastatin  (CRESTOR ) 10 MG tablet Take 1 tablet (10 mg total) by mouth daily for cholesterol 06/24/23   Vangie Genet, MD  Zinc 50 MG TABS Take by mouth daily.    [provider]    Allergies  Allergen Reactions   No Known Allergies     REVIEW OF SYSTEMS:  General: no fevers/chills/night sweats Eyes: no blurry vision, diplopia, or amaurosis ENT: no sore throat or hearing loss Resp: no cough, wheezing, or hemoptysis CV: no  edema or palpitations GI: no abdominal pain, nausea, vomiting, diarrhea, or constipation GU: no dysuria, frequency, or hematuria Skin: no rash Neuro: no headache, numbness, tingling, or weakness of extremities Musculoskeletal: no joint pain or swelling Heme: no bleeding, DVT, or easy bruising Endo: no polydipsia or polyuria  BP (!) 142/58   Pulse 97   Ht 6' (1.829 m)   Wt 243 lb (110.2 kg)   SpO2 98%   BMI 32.96 kg/m   PHYSICAL EXAM: GEN:  AO x 3 in no acute distress HEENT: normal Dentition: Normal Neck: JVP normal. +2 carotid upstrokes without bruits. No thyromegaly. Lungs: equal expansion, clear bilaterally CV: Apex is discrete and nondisplaced, RRR with very slight systolic murmur Abd: soft, non-tender, non-distended; no bruit; positive bowel sounds Ext: no edema, ecchymoses, or cyanosis Vascular: 2+ femoral pulses, 2+ radial pulses       Skin: warm and dry without rash Neuro: CN II-XII grossly intact; motor and sensory grossly intact    DATA AND STUDIES:  EKG: EKG from April 2025 demonstrates sinus rhythm with first-degree AV block  EKG Interpretation Date/Time:    Ventricular Rate:    PR Interval:    QRS Duration:    QT Interval:    QTC Calculation:   R Axis:      Text Interpretation:          Cardiac Studies & Procedures   ______________________________________________________________________________________________     ECHOCARDIOGRAM  ECHOCARDIOGRAM COMPLETE 04/01/2024  Narrative ECHOCARDIOGRAM  REPORT    Patient Name:   Joshua Koch Date of Exam: 04/01/2024 Medical Rec #:  [id number]     Height:       72.0 in Accession #:    [id number]    Weight:       249.0 lb Date of Birth:  1946/05/17     BSA:          2.339 m Patient Age:    77 years      BP:           140/52 mmHg Patient Gender: M             HR:           75 bpm. Exam Location:  Inpatient  Procedure: 2D Echo, Cardiac Doppler and Color Doppler (Both Spectral and Color Flow Doppler were utilized during procedure).  Indications:    Syncope  History:        Patient has no prior history of Echocardiogram examinations. Risk Factors:Hypertension, Diabetes and HLD.  Sonographer:    Willey Harrier Referring Phys: [id number] SARA-MAIZ A Asah  IMPRESSIONS   1. Left ventricular ejection fraction, by estimation, is 60 to 65%. The left ventricle has normal function. The left ventricle has no regional wall motion abnormalities. Left ventricular diastolic parameters were normal. 2. Right ventricular systolic function is normal. The right ventricular size is normal. Tricuspid regurgitation signal is inadequate for assessing PA pressure. 3. The mitral valve is degenerative. No evidence of mitral valve regurgitation. No evidence of mitral stenosis. 4. The aortic valve is calcified. There is severe calcifcation of the aortic valve. There is severe thickening of the aortic valve. Aortic valve regurgitation is mild. Mild aortic valve stenosis. Aortic valve area, by VTI measures 1.82 cm. Aortic valve mean gradient measures 11.0 mmHg. Aortic valve Vmax measures 2.15 m/s. 5. The inferior vena cava is normal in size with greater than 50% respiratory variability, suggesting right atrial pressure of 3 mmHg.  FINDINGS Left Ventricle: Left ventricular ejection fraction,  by estimation, is 60 to 65%. The left ventricle has normal function. The left ventricle has no regional wall motion abnormalities. The left ventricular internal cavity size was  normal in size. There is no left ventricular hypertrophy. Left ventricular diastolic parameters were normal. Normal left ventricular filling pressure.  Right Ventricle: The right ventricular size is normal. No increase in right ventricular wall thickness. Right ventricular systolic function is normal. Tricuspid regurgitation signal is inadequate for assessing PA pressure.  Left Atrium: Left atrial size was normal in size.  Right Atrium: Right atrial size was normal in size.  Pericardium: There is no evidence of pericardial effusion.  Mitral Valve: The mitral valve is degenerative in appearance. There is mild calcification of the mitral valve leaflet(s). Mild mitral annular calcification. No evidence of mitral valve regurgitation. No evidence of mitral valve stenosis. MV peak gradient, 3.3 mmHg. The mean mitral valve gradient is 2.0 mmHg.  Tricuspid Valve: The tricuspid valve is normal in structure. Tricuspid valve regurgitation is trivial. No evidence of tricuspid stenosis.  Aortic Valve: The aortic valve is calcified. There is severe calcifcation of the aortic valve. There is severe thickening of the aortic valve. Aortic valve regurgitation is mild. Mild aortic stenosis is present. Aortic valve mean gradient measures 11.0 mmHg. Aortic valve peak gradient measures 18.5 mmHg. Aortic valve area, by VTI measures 1.82 cm.  Pulmonic Valve: The pulmonic valve was normal in structure. Pulmonic valve regurgitation is trivial. No evidence of pulmonic stenosis.  Aorta: The aortic root is normal in size and structure.  Venous: The inferior vena cava is normal in size with greater than 50% respiratory variability, suggesting right atrial pressure of 3 mmHg.  IAS/Shunts: No atrial level shunt detected by color flow Doppler.   LEFT VENTRICLE PLAX 2D LVIDd:         4.60 cm   Diastology LVIDs:         3.20 cm   LV e' medial:    7.72 cm/s LV PW:         1.10 cm   LV E/e' medial:  8.8 LV IVS:         1.10 cm   LV e' lateral:   9.90 cm/s LVOT diam:     2.20 cm   LV E/e' lateral: 6.9 LV SV:         92 LV SV Index:   39 LVOT Area:     3.80 cm   RIGHT VENTRICLE            IVC RV Basal diam:  3.80 cm    IVC diam: 1.80 cm RV S prime:     9.57 cm/s TAPSE (M-mode): 1.8 cm  LEFT ATRIUM             Index        RIGHT ATRIUM           Index LA Vol (A2C):   39.9 ml 17.06 ml/m  RA Area:     17.50 cm LA Vol (A4C):   27.6 ml 11.80 ml/m  RA Volume:   47.30 ml  20.23 ml/m LA Biplane Vol: 33.1 ml 14.15 ml/m AORTIC VALVE AV Area (Vmax):    1.84 cm AV Area (Vmean):   1.84 cm AV Area (VTI):     1.82 cm AV Vmax:           215.00 cm/s AV Vmean:          161.000 cm/s AV VTI:  0.505 m AV Peak Grad:      18.5 mmHg AV Mean Grad:      11.0 mmHg LVOT Vmax:         104.00 cm/s LVOT Vmean:        78.100 cm/s LVOT VTI:          0.241 m LVOT/AV VTI ratio: 0.48  AORTA Ao Root diam: 3.40 cm Ao Asc diam:  3.70 cm  MITRAL VALVE MV Area (PHT): 3.06 cm    SHUNTS MV Area VTI:   3.72 cm    Systemic VTI:  0.24 m MV Peak grad:  3.3 mmHg    Systemic Diam: 2.20 cm MV Mean grad:  2.0 mmHg MV Vmax:       0.91 m/s MV Vmean:      62.2 cm/s MV Decel Time: 248 msec MV E velocity: 67.90 cm/s MV A velocity: 89.50 cm/s MV E/A ratio:  0.76  Gaylyn Keas MD Electronically signed by Gaylyn Keas MD Signature Date/Time: 04/01/2024/11:18:48 AM    Final          ______________________________________________________________________________________________      03/31/2024: Magnesium 2.2; TSH 7.907 04/01/2024: ALT 24; BUN 15; Creatinine, Ser 1.20; Hemoglobin 14.0; Platelets 245; Potassium 4.1; Sodium 139   STS RISK CALCULATOR: Pending  NHYA CLASS: 1    ASSESSMENT AND PLAN:   1. Pre-syncope   2. Nonrheumatic aortic valve stenosis   3. Type 2 diabetes mellitus with complication, without long-term current use of insulin  (HCC)   4. Hypertension associated with diabetes (HCC)   5.  Hyperlipidemia associated with type 2 diabetes mellitus (HCC)   6. Aortic atherosclerosis (HCC)   7. Right carotid artery occlusion   8. BMI 33.0-33.9,adult     Presyncope: I reviewed the patient's echocardiogram.  This is consistent with mild aortic stenosis with severe calcification..  The valve seems to be opening fairly well.  He does not have a significant murmur.  I do not think his aortic stenosis is causing his dizzy spells.  I will obtain a monitor to evaluate further.  I will see the patient back in 6 months. Aortic stenosis: Mild and I do not think is contributing to his symptomatology.  Will see the patient back in 6 months.  Will obtain a monitor.  If the monitor is unrevealing he may need an ENT evaluation. T2DM: Continue aspirin , olmesartan  20 mg, rosuvastatin  10 mg.  Could consider SGLT2 inhibitor in the future. Hypertension: Continue Benicar  20 mg. Hyperlipidemia: Continue Crestor  10 mg.  Given carotid disease his LDL goal is less than 55. Right carotid artery occlusion: Being treated medically.  Continue aspirin  81 mg, Plavix  75 mg, and Crestor  10 mg. Elevated BMI: The patient had been on Ozempic  in the past.  He may resume a GLP-1 receptor agonist in the future.   I have personally reviewed the patients imaging data as summarized above.  I have reviewed the natural history of aortic stenosis with the patient and family members who are present today. We have discussed the limitations of medical therapy and the poor prognosis associated with symptomatic aortic stenosis. We have also reviewed potential treatment options, including palliative medical therapy, conventional surgical aortic valve replacement, and transcatheter aortic valve replacement. We discussed treatment options in the context of this patient's specific comorbid medical conditions.   All of the patient's questions were answered today. Will make further recommendations based on the results of studies outlined above.    I spent 50 minutes reviewing all clinical  data during and prior to this visit including all relevant imaging studies, laboratories, clinical information from other health systems and prior notes from both Cardiology and other specialties, interviewing the patient, conducting a complete physical examination, and coordinating care in order to formulate a comprehensive and personalized evaluation and treatment plan.   Treana Lacour K Egon Dittus, MD  04/10/2024 9:38 AM    [facility] [address] [place], [place], [place]  [address] Phone: [phone]; Fax: [phone]

## 2024-04-10 ENCOUNTER — Other Ambulatory Visit (HOSPITAL_COMMUNITY): Payer: Self-pay

## 2024-04-10 ENCOUNTER — Ambulatory Visit: Admitting: Cardiology

## 2024-04-10 ENCOUNTER — Ambulatory Visit: Attending: Internal Medicine | Admitting: Internal Medicine

## 2024-04-10 ENCOUNTER — Ambulatory Visit

## 2024-04-10 ENCOUNTER — Encounter: Payer: Self-pay | Admitting: Internal Medicine

## 2024-04-10 VITALS — BP 142/58 | HR 97 | Ht 72.0 in | Wt 243.0 lb

## 2024-04-10 DIAGNOSIS — I350 Nonrheumatic aortic (valve) stenosis: Secondary | ICD-10-CM

## 2024-04-10 DIAGNOSIS — E785 Hyperlipidemia, unspecified: Secondary | ICD-10-CM

## 2024-04-10 DIAGNOSIS — Z6833 Body mass index (BMI) 33.0-33.9, adult: Secondary | ICD-10-CM

## 2024-04-10 DIAGNOSIS — R55 Syncope and collapse: Secondary | ICD-10-CM

## 2024-04-10 DIAGNOSIS — E1159 Type 2 diabetes mellitus with other circulatory complications: Secondary | ICD-10-CM | POA: Diagnosis not present

## 2024-04-10 DIAGNOSIS — E1169 Type 2 diabetes mellitus with other specified complication: Secondary | ICD-10-CM

## 2024-04-10 DIAGNOSIS — E118 Type 2 diabetes mellitus with unspecified complications: Secondary | ICD-10-CM | POA: Diagnosis not present

## 2024-04-10 DIAGNOSIS — I152 Hypertension secondary to endocrine disorders: Secondary | ICD-10-CM

## 2024-04-10 DIAGNOSIS — I6521 Occlusion and stenosis of right carotid artery: Secondary | ICD-10-CM

## 2024-04-10 DIAGNOSIS — I700 Atherosclerosis of aorta: Secondary | ICD-10-CM

## 2024-04-10 NOTE — Patient Instructions (Signed)
 Medication Instructions:  No changes *If you need a refill on your cardiac medications before your next appointment, please call your pharmacy*  Lab Work: none If you have labs (blood work) drawn today and your tests are completely normal, you will receive your results only by: MyChart Message (if you have MyChart) OR A paper copy in the mail If you have any lab test that is abnormal or we need to change your treatment, we will call you to review the results.  Testing/Procedures: 14 day Zio heart monitor  Follow-Up: At [facility], you and your health needs are our priority.  As part of our continuing mission to provide you with exceptional heart care, our providers are all part of one team.  This team includes your primary Cardiologist (physician) and Advanced Practice Providers or APPs (Physician Assistants and Nurse Practitioners) who all work together to provide you with the care you need, when you need it.  Your next appointment:   6 month(s)  Provider:   Alyssa Backbone, MD    Other Instructions Joshua Koch- Long Term Monitor Instructions  Your physician has requested you wear a ZIO patch monitor for 14 days.  This is a single patch monitor. [employer] supplies one patch monitor per enrollment. Additional stickers are not available. Please do not apply patch if you will be having a Nuclear Stress Test,  Echocardiogram, Cardiac CT, MRI, or Chest Xray during the period you would be wearing the  monitor. The patch cannot be worn during these tests. You cannot remove and re-apply the  ZIO XT patch monitor.  Your ZIO patch monitor will be mailed 3 day USPS to your address on file. It may take 3-5 days  to receive your monitor after you have been enrolled.  Once you have received your monitor, please review the enclosed instructions. Your monitor  has already been registered assigning a specific monitor serial # to you.  Billing and Patient Assistance Program Information  We  have supplied [employer] with any of your insurance information on file for billing purposes. [employer] offers a sliding scale Patient Assistance Program for patients that do not have  insurance, or whose insurance does not completely cover the cost of the ZIO monitor.  You must apply for the Patient Assistance Program to qualify for this discounted rate.  To apply, please call [employer] at [phone], select option 4, select option 2, ask to apply for  Patient Assistance Program. Joshua Koch will ask your household income, and how many people  are in your household. They will quote your out-of-pocket cost based on that information.  [employer] will also be able to set up a 39-month, interest-free payment plan if needed.  Applying the monitor   Shave hair from upper left chest.  Hold abrader disc by orange tab. Rub abrader in 40 strokes over the upper left chest as  indicated in your monitor instructions.  Clean area with 4 enclosed alcohol pads. Let dry.  Apply patch as indicated in monitor instructions. Patch will be placed under collarbone on left  side of chest with arrow pointing upward.  Rub patch adhesive wings for 2 minutes. Remove white label marked "1". Remove the white  label marked "2". Rub patch adhesive wings for 2 additional minutes.  While looking in a mirror, press and release button in center of patch. A small green light will  flash 3-4 times. This will be your only indicator that the monitor has been turned on.  Do not shower for  the first 24 hours. You may shower after the first 24 hours.  Press the button if you feel a symptom. You will hear a small click. Record Date, Time and  Symptom in the Patient Logbook.  When you are ready to remove the patch, follow instructions on the last 2 pages of Patient  Logbook. Stick patch monitor onto the last page of Patient Logbook.  Place Patient Logbook in the blue and white box. Use locking tab on box and tape box closed  securely. The blue  and white box has prepaid postage on it. Please place it in the mailbox as  soon as possible. Your physician should have your test results approximately 7 days after the  monitor has been mailed back to [facility].  Call [facility] Customer Care at [phone] if you have questions regarding  your ZIO XT patch monitor. Call them immediately if you see an orange light blinking on your  monitor.  If your monitor falls off in less than 4 days, contact our Monitor department at [phone].  If your monitor becomes loose or falls off after 4 days call [employer] at [phone] for  suggestions on securing your monitor

## 2024-04-10 NOTE — Progress Notes (Unsigned)
 Applied a 7 day Zio XT monitor to patient in the office

## 2024-04-11 ENCOUNTER — Other Ambulatory Visit (HOSPITAL_COMMUNITY): Payer: Self-pay

## 2024-04-14 ENCOUNTER — Other Ambulatory Visit (HOSPITAL_COMMUNITY): Payer: Self-pay

## 2024-04-14 ENCOUNTER — Other Ambulatory Visit: Payer: Self-pay | Admitting: Internal Medicine

## 2024-04-14 DIAGNOSIS — E1169 Type 2 diabetes mellitus with other specified complication: Secondary | ICD-10-CM

## 2024-04-15 ENCOUNTER — Other Ambulatory Visit (HOSPITAL_COMMUNITY): Payer: Self-pay

## 2024-04-15 ENCOUNTER — Other Ambulatory Visit: Payer: Self-pay | Admitting: Internal Medicine

## 2024-04-15 ENCOUNTER — Other Ambulatory Visit: Payer: Self-pay

## 2024-04-15 DIAGNOSIS — E1169 Type 2 diabetes mellitus with other specified complication: Secondary | ICD-10-CM

## 2024-04-15 MED ORDER — ROSUVASTATIN CALCIUM 10 MG PO TABS
10.0000 mg | ORAL_TABLET | Freq: Every day | ORAL | 3 refills | Status: AC
  Filled 2024-04-15: qty 90, 90d supply, fill #0
  Filled 2024-07-15 – 2024-07-18 (×2): qty 90, 90d supply, fill #1
  Filled 2024-10-13: qty 90, 90d supply, fill #2

## 2024-04-27 ENCOUNTER — Other Ambulatory Visit (HOSPITAL_COMMUNITY): Payer: Self-pay

## 2024-04-27 ENCOUNTER — Other Ambulatory Visit: Payer: Self-pay | Admitting: Nurse Practitioner

## 2024-04-27 DIAGNOSIS — E1122 Type 2 diabetes mellitus with diabetic chronic kidney disease: Secondary | ICD-10-CM

## 2024-04-27 MED ORDER — ICOSAPENT ETHYL 1 G PO CAPS
2.0000 g | ORAL_CAPSULE | Freq: Two times a day (BID) | ORAL | 3 refills | Status: DC
  Filled 2024-04-27: qty 120, 30d supply, fill #0

## 2024-04-30 ENCOUNTER — Ambulatory Visit: Payer: Self-pay | Admitting: Internal Medicine

## 2024-04-30 DIAGNOSIS — I441 Atrioventricular block, second degree: Secondary | ICD-10-CM

## 2024-04-30 DIAGNOSIS — R55 Syncope and collapse: Secondary | ICD-10-CM

## 2024-05-01 ENCOUNTER — Other Ambulatory Visit (HOSPITAL_COMMUNITY): Payer: Self-pay

## 2024-05-18 ENCOUNTER — Other Ambulatory Visit (HOSPITAL_COMMUNITY): Payer: Self-pay

## 2024-05-18 ENCOUNTER — Ambulatory Visit: Attending: Internal Medicine | Admitting: Cardiology

## 2024-05-18 ENCOUNTER — Encounter: Payer: Self-pay | Admitting: Cardiology

## 2024-05-18 VITALS — BP 130/66 | HR 76 | Ht 72.0 in | Wt 240.0 lb

## 2024-05-18 DIAGNOSIS — G4733 Obstructive sleep apnea (adult) (pediatric): Secondary | ICD-10-CM

## 2024-05-18 DIAGNOSIS — I10 Essential (primary) hypertension: Secondary | ICD-10-CM | POA: Diagnosis not present

## 2024-05-18 DIAGNOSIS — I441 Atrioventricular block, second degree: Secondary | ICD-10-CM | POA: Diagnosis not present

## 2024-05-18 DIAGNOSIS — I350 Nonrheumatic aortic (valve) stenosis: Secondary | ICD-10-CM

## 2024-05-18 NOTE — Patient Instructions (Addendum)
 Medication Instructions:  Your physician recommends that you continue on your current medications as directed. Please refer to the Current Medication list given to you today.  *If you need a refill on your cardiac medications before your next appointment, please call your pharmacy*  Lab Work: None ordered  If you have any lab test that is abnormal or we need to change your treatment, we will call you to review the results.  Testing/Procedures: None ordered  Follow-Up: At [facility], you and your health needs are our priority.  As part of our continuing mission to provide you with exceptional heart care, our providers are all part of one team.  This team includes your primary Cardiologist (physician) and Advanced Practice Providers or APPs (Physician Assistants and Nurse Practitioners) who all work together to provide you with the care you need, when you need it.  Your next appointment:   3 month(s)     (with possible loop recorder implant)  Provider:   Agatha Horsfall, MD     You have been referred to Dr. Micael Adas for your sleep apnea  Thank you for choosing [facility]!!   Reece Cane, RN [phone]   Other Instructions   Implantable Loop Recorder Placement, Care After This sheet gives you information about how to care for yourself after your procedure. Your health care provider may also give you more specific instructions. If you have problems or questions, contact your health care provider. What can I expect after the procedure? After the procedure, it is common to have: Soreness or discomfort near the incision. Some swelling or bruising near the incision.  Follow these instructions at home: Incision care  Monitor your cardiac device site for redness, swelling, and drainage. Call the device clinic at [phone] if you experience these symptoms or fever/chills.  Keep the large square bandage on your site for 24 hours and then you may remove it  yourself. Keep the steri-strips underneath in place.   You may shower after 72 hours / 3 days from your procedure with the steri-strips in place. They will usually fall off on their own, or may be removed after 10 days. Pat dry.   Avoid lotions, ointments, or perfumes over your incision until it is well-healed.  Please do not submerge in water until your site is completely healed.   Your device is MRI compatible.   Remote monitoring is used to monitor your cardiac device from home. This monitoring is scheduled every month by our office. It allows us  to keep an eye on the function of your device to ensure it is working properly.  If your wound site starts to bleed apply pressure.    For help with the monitor please call [employer] Monitor Support Specialist directly at [phone].    If you have any questions/concerns please call the device clinic at [phone].  Activity  Return to your normal activities.  General instructions Follow instructions from your health care provider about how to manage your implantable loop recorder and transmit the information. Learn how to activate a recording if this is necessary for your type of device. You may go through a metal detection gate, and you may let someone hold a metal detector over your chest. Show your ID card if needed. Do not have an MRI unless you check with your health care provider first. Take over-the-counter and prescription medicines only as told by your health care provider. Keep all follow-up visits as told by your health care provider. This is  important. Contact a health care provider if: You have redness, swelling, or pain around your incision. You have a fever. You have pain that is not relieved by your pain medicine. You have triggered your device because of fainting (syncope) or because of a heartbeat that feels like it is racing, slow, fluttering, or skipping (palpitations). Get help right away if you have: Chest  pain. Difficulty breathing. Summary After the procedure, it is common to have soreness or discomfort near the incision. Change your dressing as told by your health care provider. Follow instructions from your health care provider about how to manage your implantable loop recorder and transmit the information. Keep all follow-up visits as told by your health care provider. This is important. This information is not intended to replace advice given to you by your health care provider. Make sure you discuss any questions you have with your health care provider. Document Released: 11/07/2015 Document Revised: 01/11/2018 Document Reviewed: 01/11/2018 Elsevier Patient Education  2020 [employer].

## 2024-05-18 NOTE — Progress Notes (Addendum)
 Electrophysiology Office Note:   Date:  05/18/2024  ID:  Joshua Koch, DOB 04/26/1946, MRN [id number]  Primary Cardiologist: Joshua Phy, MD Primary Heart Failure: None Electrophysiologist: None      History of Present Illness:   Joshua Koch is a 78 y.o. male with h/o type 2 diabetes, aortic stenosis, hypertension, sleep apnea seen today for  for Electrophysiology evaluation of near syncope at the request of Joshua Koch.   He was seen in the emergency room recently due to an episode of syncope.  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  Brain MRI was reassuring.  Echo showed aortic valve disease.  He has dizziness and flushing prior to these episodes.  He has a rushing up sensation from her chest to the head we need to be near fainting feeling.  Episodes have occurred several times.  He has no nausea associated with these episodes.  Episodes were significantly worse prior to his hospitalization.  He is now having 2-3 episodes per week and episodes last for seconds at a time.  He has had no near-syncope since the episode where he was in the hospital.  He wore a cardiac monitor and had symptoms correlated with blocked PACs and blocked sinus beats.  Has not complained of significant fatigue.  He has sleep apnea.  He has not seen a sleep physician in quite some time.  His level of activity has gone down due to fatigue.  Review of systems complete and found to be negative unless listed in HPI.   EP Information / Studies Reviewed:    EKG is ordered today. Personal review as below.  EKG Interpretation Date/Time:  Monday May 18 2024 14:57:25 EDT Ventricular Rate:  76 PR Interval:  210 QRS Duration:  94 QT Interval:  360 QTC Calculation: 405 R Axis:   -47  Text Interpretation: Sinus rhythm with 1st degree A-V block Left anterior fascicular block Minimal voltage criteria for LVH, may be normal  variant ( R in aVL ) When compared with ECG of 31-Mar-2024 15:50, No significant change since last tracing Confirmed by Joshua Koch ([id number]) on 05/18/2024 3:05:04 PM     Risk Assessment/Calculations:             Physical Exam:   VS:  BP 130/66 (BP Location: Right Arm, Patient Position: Sitting, Cuff Size: Large)   Pulse 76   Ht 6' (1.829 m)   Wt 240 lb (108.9 kg)   SpO2 95%   BMI 32.55 kg/m    Wt Readings from Last 3 Encounters:  05/18/24 240 lb (108.9 kg)  04/10/24 243 lb (110.2 kg)  03/31/24 249 lb (112.9 kg)     GEN: Well nourished, well developed in no acute distress NECK: No JVD; No carotid bruits CARDIAC: Regular rate and rhythm, no murmurs, rubs, gallops RESPIRATORY:  Clear to auscultation without rales, wheezing or rhonchi  ABDOMEN: Soft, non-tender, non-distended EXTREMITIES:  No edema; No deformity   ASSESSMENT AND PLAN:    1.  Presyncope with intermittent second-degree AV block: Wore a cardiac monitor, personally reviewed which showed intermittent second-degree AV block with pauses up to 3.6 seconds.  All pause episodes occurred between 1 and 5 AM.  He is having a few episodes of dizziness associated with both blocked PACs and nonconducted sinus beats.  He is having pauses of less than 2 seconds during these episodes.  He is  okay with continued monitoring.  If episodes worsen, he would potentially need pacemaker.  He is happy to hold off on pacemaker implantation for now.  2.  Mild aortic stenosis: Has heavy calcification.  Followed by general cardiology.  3.  Hypertension: Well-controlled  4.  Right carotid artery occlusion: Treated medically.  Plan per primary cardiology.  5.  Obesity: Has been on Ozempic  in the past.  Continue lifestyle modification urged  4.  Obstructive sleep apnea: Joshua Koch have him follow-up with sleep medicine for increased fatigue.  Follow up with Dr. Lawana Koch in 3 months  Signed, Joshua Dahmer Cortland Ding, MD

## 2024-05-18 NOTE — Addendum Note (Signed)
 Addended by: Alvenia Aus on: 05/18/2024 03:33 PM   Modules accepted: Orders

## 2024-05-19 ENCOUNTER — Telehealth: Payer: Self-pay

## 2024-05-19 DIAGNOSIS — G4733 Obstructive sleep apnea (adult) (pediatric): Secondary | ICD-10-CM

## 2024-05-19 NOTE — Telephone Encounter (Signed)
 Made appt for patient before realizing he has previously been seen for sleep medicine elsewhere. Canceled appointment and placed referral. Forwarded to sleep scheduler. Sent message to patient advising appt would be scheduled when referral materials were received.

## 2024-05-19 NOTE — Telephone Encounter (Signed)
-----   Message from Gaylyn Keas sent at 05/18/2024  5:38 PM EDT ----- Regarding: RE: New referral for sleep We need sleep records before I can see him. Yes they need send in sleep consult ----- Message ----- From: Cherylyn Cos, RN Sent: 05/18/2024   5:37 PM EDT To: Jacqueline Matsu, MD; Alvenia Aus, RN; # Subject: New referral for sleep                         Hi,  There is a patient scheduled for 06/02/24 at 11:20 AM. Dr. Adell Age team put him on as the patient said he has been worked up for sleep apnea before but has never been seen by Dr. Micael Adas. They approached me for help as they were unsure of the sleep referral process.   I explained that we would normally need all his sleep medicine records before we would schedule him, they were able to get him to sign a medical release for the records as he was here in the building.   Should we keep him on the schedule for now or ask that [facility]'s team put in a sleep medicine referral and start the process from there? Ardelia Beau, RN

## 2024-05-21 ENCOUNTER — Other Ambulatory Visit: Payer: Self-pay

## 2024-05-21 ENCOUNTER — Other Ambulatory Visit (HOSPITAL_COMMUNITY): Payer: Self-pay

## 2024-05-21 MED ORDER — METFORMIN HCL ER 500 MG PO TB24
1000.0000 mg | ORAL_TABLET | Freq: Two times a day (BID) | ORAL | 0 refills | Status: DC
  Filled 2024-05-21: qty 360, 90d supply, fill #0

## 2024-05-22 ENCOUNTER — Other Ambulatory Visit: Payer: Self-pay

## 2024-05-26 ENCOUNTER — Other Ambulatory Visit (HOSPITAL_COMMUNITY): Payer: Self-pay

## 2024-05-28 ENCOUNTER — Telehealth: Payer: Self-pay

## 2024-05-28 NOTE — Telephone Encounter (Signed)
 Call to patient to find out where he previously received treatment for sleep apnea so we can request records. No answer, no DPR on file. Left message with no identifiers asking recipient to call [place] at our office #. Second request, see MC from 05/25/24.

## 2024-06-02 ENCOUNTER — Ambulatory Visit: Admitting: Cardiology

## 2024-06-04 ENCOUNTER — Telehealth: Payer: Self-pay

## 2024-06-04 NOTE — Telephone Encounter (Signed)
 Call to patient in regards to sleep medicine referral and obtaining records. No answer, no DPR on file. Left message with no identifiers asking recipient to call [place] at our office #. Third attempt. Letter sent.

## 2024-06-26 ENCOUNTER — Encounter: Payer: Self-pay | Admitting: Internal Medicine

## 2024-06-26 ENCOUNTER — Other Ambulatory Visit (HOSPITAL_COMMUNITY): Payer: Self-pay

## 2024-06-26 MED ORDER — CLOPIDOGREL BISULFATE 75 MG PO TABS
75.0000 mg | ORAL_TABLET | Freq: Every day | ORAL | 3 refills | Status: AC
  Filled 2024-06-26: qty 90, 90d supply, fill #0
  Filled 2024-09-21: qty 90, 90d supply, fill #1
  Filled 2024-12-28: qty 90, 90d supply, fill #2

## 2024-07-01 ENCOUNTER — Other Ambulatory Visit: Payer: Self-pay

## 2024-07-02 ENCOUNTER — Other Ambulatory Visit (HOSPITAL_COMMUNITY): Payer: Self-pay

## 2024-07-07 ENCOUNTER — Ambulatory Visit: Payer: PPO | Admitting: Nurse Practitioner

## 2024-07-14 ENCOUNTER — Telehealth: Payer: Self-pay | Admitting: Cardiology

## 2024-07-14 DIAGNOSIS — G4733 Obstructive sleep apnea (adult) (pediatric): Secondary | ICD-10-CM

## 2024-07-14 NOTE — Telephone Encounter (Signed)
 Pt calling to speak with nurse. She has some questions about 3 mon f/u and possible loop recorder implant.

## 2024-07-14 NOTE — Telephone Encounter (Signed)
 Left voice message to call back 8/5

## 2024-07-14 NOTE — Telephone Encounter (Signed)
 Pt called in to sch sleep consult. I'm not able to find any notes or sleep study records on pt either. She states pt thinks he had sleep study done years ago with [facility] but she doesn't think they are open anymore. He has been on CPAP for years. Is there a way that someone here can order pt a sleep study? Possibly Dr. Inocencio since her referred him? Please advise.

## 2024-07-15 ENCOUNTER — Other Ambulatory Visit (HOSPITAL_COMMUNITY): Payer: Self-pay

## 2024-07-15 MED ORDER — GABAPENTIN 100 MG PO CAPS
100.0000 mg | ORAL_CAPSULE | Freq: Every day | ORAL | 0 refills | Status: DC
  Filled 2024-07-15: qty 30, 30d supply, fill #0

## 2024-07-15 NOTE — Telephone Encounter (Signed)
 MC to patient to advise split night sleep study has been ordered.

## 2024-07-15 NOTE — Telephone Encounter (Signed)
 Spoke with pt's wife and she stated she was confused about the possible implant of the loop recorder listed on the pt's AVS. She was saying that while the episodes the pt had been having have decreased in frequency, he is still having them and she thinks he would need a device that would do more than just monitor. I explained that Dr. Carolyn note stated holding off on pacemaker implant for the time being and that I would send him a message to get clarification on if we were holding off on deciding loop recorder vs PPM placement. Wife verbalized understanding of this and denied any further questions or concerns. Will send to Dr. Inocencio to review.

## 2024-07-15 NOTE — Telephone Encounter (Signed)
 Wife returning call to a nurse

## 2024-07-16 ENCOUNTER — Other Ambulatory Visit: Payer: Self-pay

## 2024-07-16 NOTE — Telephone Encounter (Signed)
 Spoke to pt and wife.  Aware Dr. Inocencio would like sleep evaluation/possible another sleep study before making any further decisions.  The appreciate the return call and discussion on this and are agreeable to seeing sleep first. They do understand to call me back if circumstances change and need to readdress EP related things prior to sleep study.

## 2024-07-18 ENCOUNTER — Other Ambulatory Visit (HOSPITAL_COMMUNITY): Payer: Self-pay

## 2024-07-20 ENCOUNTER — Other Ambulatory Visit: Payer: Self-pay

## 2024-07-20 ENCOUNTER — Other Ambulatory Visit (HOSPITAL_COMMUNITY): Payer: Self-pay

## 2024-07-21 NOTE — Telephone Encounter (Signed)
 Prior Authorization for [facility] sent to [facility] via web portal. Tracking Number . Outpatient Authorization #[id number]-Approved-Service Dates:07/21/2024 - 10/19/2024-Authorization #[id number]

## 2024-07-21 NOTE — Telephone Encounter (Signed)
Sent to sleep lab for scheduling

## 2024-07-21 NOTE — Telephone Encounter (Signed)
 Sleep study ordered, please authorize, thank you!

## 2024-07-22 ENCOUNTER — Other Ambulatory Visit (HOSPITAL_COMMUNITY): Payer: Self-pay

## 2024-07-23 ENCOUNTER — Other Ambulatory Visit: Payer: Self-pay

## 2024-07-23 ENCOUNTER — Other Ambulatory Visit (HOSPITAL_COMMUNITY): Payer: Self-pay

## 2024-07-25 ENCOUNTER — Other Ambulatory Visit (HOSPITAL_COMMUNITY): Payer: Self-pay

## 2024-07-29 ENCOUNTER — Other Ambulatory Visit: Payer: Self-pay

## 2024-08-03 ENCOUNTER — Other Ambulatory Visit: Payer: Self-pay

## 2024-08-03 ENCOUNTER — Other Ambulatory Visit (HOSPITAL_COMMUNITY): Payer: Self-pay

## 2024-08-03 MED ORDER — OLMESARTAN MEDOXOMIL 20 MG PO TABS
20.0000 mg | ORAL_TABLET | Freq: Every day | ORAL | 1 refills | Status: AC
  Filled 2024-08-03: qty 90, 90d supply, fill #0
  Filled 2024-11-04: qty 90, 90d supply, fill #1

## 2024-08-06 ENCOUNTER — Ambulatory Visit (HOSPITAL_BASED_OUTPATIENT_CLINIC_OR_DEPARTMENT_OTHER): Attending: Cardiology | Admitting: Cardiology

## 2024-08-06 DIAGNOSIS — G4733 Obstructive sleep apnea (adult) (pediatric): Secondary | ICD-10-CM | POA: Insufficient documentation

## 2024-08-16 NOTE — Procedures (Addendum)
 Indications for Polysomnography The patient is a 78 year old Male who is 6' and weighs 240.0 lbs.  His BMI equals 32.9.  A diagnostic polysomnogram was performed to evaluate for Obstructive Sleep Apnea.  After 124.5 minutes of sleep time the patient exhibited sufficient respiratory  events qualifying him for a CPAP trial which was then initiated.  MedicationNo Data. Polysomnogram Data A full night polysomnogram was performed recording the standard physiologic parameters including EEG, EOG, EMG, EKG, nasal and oral airflow.  Respiratory parameters of chest and abdominal movements are recorded with Peizo-Crystal motion transducers.   Oxygen saturation was recorded by pulse oximetry.  Sleep Architecture The total recording time of the diagnostic portion of the study was 194.7 minutes.  The total sleep time was 124.5 minutes.  During the diagnostic portion of the study, the patient spent 2.8% of total sleep time in Stage N1, 97.2% in Stage N2, 0.0% in  Stages N3, and 0.0% in REM.   Sleep latency was 38.2 minutes.  Sleep Efficiency was 63.9%.  Wake after Sleep Onset time was 32.0 minutes.  At 12:49:57 AM the patient was placed on PAP treatment and was titrated at pressures ranging from 5 cm/H20 up to 12 cm/H20. The total recording time of the treatment portion of the study was 206.9 minutes.  The total sleep time was 107.5 minutes.  During  the treatment portion of the study, the patient spent 4.2% of total sleep time in Stage N1, 88.4% in Stage N2, 0.0% in Stages N3, and 7.4% in REM.   Sleep latency was 26.5 minutes.  REM latency was 109.0 minutes.  Sleep Efficiency was 51.9%.  Wake after  Sleep Onset time was 73.0 minutes.  Respiratory Events During the diagnostic portion of the study, the polysomnogram revealed a presence of 2 obstructive, 0 central, and 0 mixed apneas resulting in an Apnea index of 1.0 events per hour.  There were 84 hypopneas (GreaterEqual to3% desaturation and/or arousal)   resulting in an Apnea\Hypopnea Index (AHI GreaterEqual to3% desaturation and/or arousal) of 41.4 events per hour.  There were 40 hypopneas (GreaterEqual to4% desaturation) resulting in an Apnea\Hypopnea Index (AHI GreaterEqual to4% desaturation) of 20.2  events per hour.  There were 0 Respiratory Effort Related Arousals resulting in a RERA index of 0 events per hour. The Respiratory Disturbance Index is 41.4 events per hour.  The snore index was 259.3 events per hour.  Mean oxygen saturation was 95.4%.   The lowest oxygen saturation during sleep was 89.0%.  Time spent LessEqual to88% oxygen saturation was  minutes ().  During the treatment portion of the study, the polysomnogram revealed a presence of 0 obstructive, 0 central, and 0 mixed apneas resulting in an Apnea index of 0 events per hour.  There were 9 hypopneas (GreaterEqual to3% desaturation and/or arousal)  resulting in an Apnea\Hypopnea Index (AHI GreaterEqual to3% desaturation and/or arousal) of 5.0 events per hour.  There were 3 hypopneas (GreaterEqual to4% desaturation) resulting in an Apnea\Hypopnea Index (AHI GreaterEqual to4% desaturation) of 1.7  events per hour.  There were 0 Respiratory Effort Related Arousals resulting in a RERA index of 0 events per hour. The Respiratory Disturbance Index is 5.0 events per hour.  The snore index was 14.0 events per hour.  Mean oxygen saturation was 95.8%.   The lowest oxygen saturation during sleep was 92.0%.  Time spent LessEqual to88% oxygen saturation was 0 minutes.  Limb Activity During the diagnostic portion of the study, there were 84 limb movements recorded.  Of this total, 68 were classified as PLMs.  Of the PLMs, 4 were associated with arousals.  The Limb Movement index was 40.5 per hour while the PLM index was 32.8 per  hour.  During the treatment portion of the study, there were 119 limb movements recorded.  Of this total, 91 were classified as PLMs.  Of the PLMs, 1 were associated with  arousals.  The Limb Movement index was 66.4 per hour while the PLM index was 50.8 per  hour.  Cardiac Summary During the diagnostic portion of the study, the average pulse rate was 60.2 bpm.  The minimum pulse rate was 47.0 bpm while the maximum pulse rate was 80.0 bpm.  During the treatment portion of the study, the average pulse rate was 57.7 bpm.  The minimum pulse rate was 48.0 bpm while the maximum pulse rate was 85.0 bpm.  Diagnosis: Moderate Obstructive Sleep Apnea Successful CPAP titration  Recommendations: 1. Recommend a trial of ResMed CPAP at 12cm H2O with medium ResMed  Air touch N30i mask. 2. Close follow-up is necessary to ensure success with CPAP or oral appliance therapy for maximum benefit. 3. A follow-up oximetry study on CPAP is recommended to assess the adequacy of therapy and determine the need for supplemental oxygen or the potential need for Bi-level therapy.  An arterial blood gas to determine the adequacy of baseline ventilation and  oxygenation should also be considered. 4. Healthy sleep recommendations include:  adequate nightly sleep (normal 7-9 hrs/night), avoidance of caffeine after noon and alcohol near bedtime, and maintaining a sleep environment that is cool, dark and quiet. 5. Weight loss for overweight patients is recommended.  Even modest amounts of weight loss can significantly improve the severity of sleep apnea. 6. Snoring recommendations include:  weight loss where appropriate, side sleeping, and avoidance of alcohol before bed. 7. Operation of motor vehicle should be avoided when sleepy.    This study was personally reviewed and electronically signed by: Wilbert Bihari MD Accredited Board Certified in Sleep Medicine Date/Time: 08/16/2024 4:24PM

## 2024-08-17 ENCOUNTER — Other Ambulatory Visit (HOSPITAL_COMMUNITY): Payer: Self-pay

## 2024-08-18 ENCOUNTER — Other Ambulatory Visit: Payer: Self-pay

## 2024-08-18 ENCOUNTER — Other Ambulatory Visit (HOSPITAL_COMMUNITY): Payer: Self-pay

## 2024-08-18 MED ORDER — METFORMIN HCL ER 500 MG PO TB24
1000.0000 mg | ORAL_TABLET | Freq: Two times a day (BID) | ORAL | 0 refills | Status: DC
  Filled 2024-08-18: qty 360, 90d supply, fill #0

## 2024-08-24 ENCOUNTER — Telehealth: Payer: Self-pay | Admitting: *Deleted

## 2024-08-24 NOTE — Telephone Encounter (Signed)
 The patient has been notified of the result and verbalized understanding.  All questions (if any) were answered. Joshua Koch, CMA 08/24/2024 2:02 PM    Upon patient request DME selection is [facility] Patient understands he will be contacted by [facility] to set up his cpap. Patient understands to call if [facility] does not contact him with new setup in a timely manner. Patient understands they will be called once confirmation has been received from [facility] that they have received their new machine to schedule 10 week follow up appointment.   [facility] notified of new cpap order  Please add to airview Patient was grateful for the call and thanked me.

## 2024-08-24 NOTE — Telephone Encounter (Signed)
-----   Message from Wilbert Bihari sent at 08/16/2024  4:26 PM EDT ----- Please let patient know that they have sleep apnea with successful PAP titration.  PAP ordered placed in Epic.  Followup in 6 weeks after starting PAP therapy

## 2024-08-25 NOTE — Telephone Encounter (Signed)
 Called patient and rescheduled his appt with Dr. Shlomo to 11/21. Wife expressed concerns with patient having a loop implant. She states that she thought he would be getting a pacemaker because the loop does nothing for him. She is requesting to speak with Sherri. Please advise.

## 2024-08-26 NOTE — Telephone Encounter (Signed)
 Spoke to pt and wife. Discussed why ILR was recommended and also discussed how important the sleep apnea CPAP changes should hopefully make an improvement in pt's profound fatigue.  They are seeing [facility] next Wednesday for a 2nd opinion and follow up with Dr. Shlomo in November to see if  CPAP titration changes make a difference.    Aware will forward to scheduler to add pt onto 12/3 clinic with Dr. Inocencio for possible ILR implant. Pt and wife agreeable to plan.

## 2024-08-27 NOTE — Telephone Encounter (Signed)
 Spoke w/ patients wife Vina - patient is scheduled for loop implant on 12/10. I offered 12/3 at 830am but they preferred an afternoon time.

## 2024-09-07 ENCOUNTER — Ambulatory Visit: Admitting: Cardiology

## 2024-09-20 ENCOUNTER — Encounter (HOSPITAL_BASED_OUTPATIENT_CLINIC_OR_DEPARTMENT_OTHER): Admitting: Cardiology

## 2024-09-21 ENCOUNTER — Other Ambulatory Visit: Payer: Self-pay

## 2024-10-06 ENCOUNTER — Ambulatory Visit: Payer: Self-pay | Admitting: Cardiology

## 2024-10-08 ENCOUNTER — Ambulatory Visit: Payer: PPO | Admitting: Internal Medicine

## 2024-10-13 ENCOUNTER — Other Ambulatory Visit (HOSPITAL_COMMUNITY): Payer: Self-pay

## 2024-10-22 ENCOUNTER — Telehealth: Payer: Self-pay

## 2024-10-23 ENCOUNTER — Other Ambulatory Visit: Payer: Self-pay | Admitting: Neurosurgery

## 2024-10-23 DIAGNOSIS — M47816 Spondylosis without myelopathy or radiculopathy, lumbar region: Secondary | ICD-10-CM

## 2024-10-26 ENCOUNTER — Other Ambulatory Visit (HOSPITAL_COMMUNITY): Payer: Self-pay

## 2024-10-26 ENCOUNTER — Other Ambulatory Visit: Payer: Self-pay

## 2024-10-26 MED ORDER — MOUNJARO 2.5 MG/0.5ML ~~LOC~~ SOAJ
2.5000 mg | SUBCUTANEOUS | 0 refills | Status: DC
  Filled 2024-10-26 – 2024-11-10 (×2): qty 2, 28d supply, fill #0

## 2024-10-26 MED ORDER — ROSUVASTATIN CALCIUM 20 MG PO TABS
20.0000 mg | ORAL_TABLET | Freq: Every day | ORAL | 3 refills | Status: DC
  Filled 2024-10-26: qty 90, 90d supply, fill #0

## 2024-10-30 ENCOUNTER — Ambulatory Visit: Admitting: Cardiology

## 2024-10-30 ENCOUNTER — Institutional Professional Consult (permissible substitution): Admitting: Cardiology

## 2024-10-30 ENCOUNTER — Other Ambulatory Visit: Payer: Self-pay

## 2024-10-30 ENCOUNTER — Other Ambulatory Visit (HOSPITAL_COMMUNITY): Payer: Self-pay

## 2024-10-30 MED ORDER — PANTOPRAZOLE SODIUM 40 MG PO TBEC
40.0000 mg | DELAYED_RELEASE_TABLET | Freq: Every day | ORAL | 1 refills | Status: DC
  Filled 2024-10-30: qty 90, 90d supply, fill #0

## 2024-10-31 ENCOUNTER — Ambulatory Visit
Admission: RE | Admit: 2024-10-31 | Discharge: 2024-10-31 | Disposition: A | Discharge status: Home / Self Care | Source: Ambulatory Visit | Attending: Neurosurgery | Admitting: Neurosurgery

## 2024-10-31 DIAGNOSIS — M47816 Spondylosis without myelopathy or radiculopathy, lumbar region: Secondary | ICD-10-CM

## 2024-11-02 ENCOUNTER — Institutional Professional Consult (permissible substitution): Admitting: Cardiology

## 2024-11-04 ENCOUNTER — Other Ambulatory Visit (HOSPITAL_COMMUNITY): Payer: Self-pay

## 2024-11-10 ENCOUNTER — Other Ambulatory Visit (HOSPITAL_COMMUNITY): Payer: Self-pay

## 2024-11-10 ENCOUNTER — Other Ambulatory Visit: Payer: Self-pay

## 2024-11-18 ENCOUNTER — Ambulatory Visit: Admitting: Cardiology

## 2024-11-19 ENCOUNTER — Other Ambulatory Visit (HOSPITAL_COMMUNITY): Payer: Self-pay

## 2024-11-20 ENCOUNTER — Other Ambulatory Visit (HOSPITAL_COMMUNITY): Payer: Self-pay

## 2024-11-20 ENCOUNTER — Other Ambulatory Visit: Payer: Self-pay

## 2024-11-20 MED ORDER — METFORMIN HCL ER 500 MG PO TB24
1000.0000 mg | ORAL_TABLET | Freq: Two times a day (BID) | ORAL | 0 refills | Status: AC
  Filled 2024-11-20: qty 360, 90d supply, fill #0

## 2024-12-05 ENCOUNTER — Other Ambulatory Visit (HOSPITAL_COMMUNITY): Payer: Self-pay

## 2024-12-07 ENCOUNTER — Other Ambulatory Visit (HOSPITAL_COMMUNITY): Payer: Self-pay

## 2024-12-07 MED ORDER — MOUNJARO 2.5 MG/0.5ML ~~LOC~~ SOAJ
2.5000 mg | SUBCUTANEOUS | 0 refills | Status: AC
  Filled 2024-12-07: qty 2, 28d supply, fill #0

## 2024-12-18 ENCOUNTER — Ambulatory Visit: Admitting: Cardiology

## 2024-12-18 ENCOUNTER — Telehealth: Payer: Self-pay | Admitting: Internal Medicine

## 2024-12-18 NOTE — Telephone Encounter (Signed)
 When called to schedule recall f/u, was told he had procedure done at [facility] and will not be coming back to see Dr. Wendel.

## 2024-12-21 ENCOUNTER — Other Ambulatory Visit: Payer: Self-pay | Admitting: Physician Assistant

## 2024-12-21 ENCOUNTER — Ambulatory Visit
Admission: RE | Admit: 2024-12-21 | Discharge: 2024-12-21 | Disposition: A | Source: Ambulatory Visit | Attending: Physician Assistant | Admitting: Physician Assistant

## 2024-12-21 ENCOUNTER — Ambulatory Visit: Admitting: Cardiology

## 2024-12-21 DIAGNOSIS — S069X9A Unspecified intracranial injury with loss of consciousness of unspecified duration, initial encounter: Secondary | ICD-10-CM

## 2024-12-28 ENCOUNTER — Telehealth: Payer: Self-pay

## 2024-12-28 ENCOUNTER — Other Ambulatory Visit (HOSPITAL_COMMUNITY): Payer: Self-pay

## 2024-12-28 NOTE — Telephone Encounter (Signed)
 SABRA

## 2024-12-30 ENCOUNTER — Ambulatory Visit: Admitting: Cardiology

## 2024-12-30 ENCOUNTER — Encounter: Payer: Self-pay | Admitting: Cardiology

## 2024-12-30 VITALS — BP 118/58 | HR 82 | Ht 72.0 in | Wt 241.8 lb

## 2024-12-30 DIAGNOSIS — I10 Essential (primary) hypertension: Secondary | ICD-10-CM

## 2024-12-30 DIAGNOSIS — G4733 Obstructive sleep apnea (adult) (pediatric): Secondary | ICD-10-CM

## 2024-12-30 NOTE — Progress Notes (Unsigned)
 "    Sleep Medicine CONSULT Note    Date:  12/30/2024   ID:  Joshua Koch, DOB [birth year], MRN [id number]  PCP:  Pcp, No  Cardiologist: Joshua MARLA Red, MD   Chief Complaint  Patient presents with   New Patient (Initial Visit)    Obstructive sleep apnea    History of Present Illness:  Joshua Koch is a 79 y.o. male who is being seen today for the evaluation of obstructive sleep apnea at the request of Joshua Red, MD.  This is a 79 year old obese white male with a history of diabetes mellitus 2, GERD, hyperlipidemia, hypertension, aortic stenosis, carotid artery disease and obstructive sleep apnea..  Apparently the patient had a sleep study done years ago at [facility] who are no longer open.  He has been on CPAP for years.  Dr. Inocencio ordered a repeat sleep study since it been so long.  He underwent split night study 08/06/2024 which revealed moderate obstructive sleep apnea with an AHI of 20.2/h with severe snoring and RDI 41.4/h.  He underwent CPAP titration and was titrated to 12 cm H2O.  He was prescribed a ResMed AirSense 11 CPAP at 12 cm H2O with medium ResMed AirTouch N30 I mask and heated humidity.  He is now referred for sleep medicine consultation for treatment of obstructive sleep apnea  He is doing well with his PAP device.  He tolerates the under the nose FFM mask and feels the pressure is adequate.  He feels rested in the am and has no significant daytime sleepiness unless he has been sitting around for a while.SABRA  He denies any significant nasal dryness or nasal congestion.  He has a lot of mouth dryness and tried a chin strap but did not help. He has not wanted a FFM because he is not wiling to shave his beard. He does not think that he snores. An Epworth Sleepiness Scale score was calculated the office today and this endorsed at 5 arguing against residual daytime sleepiness. Patient denies any episodes of bruxism, restless legs, No hypnogognic hallucinations or  cataplectic events.     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   Dyspnea    Erectile dysfunction    GERD (gastroesophageal reflux disease)    Headache    Heart murmur    History of kidney stones    Hypercholesteremia    Hyperlipemia    Hypertension    Kidney stone    Low back pain    Obesity    Rotator cuff dysfunction    partial tear , left shoulder   Sleep apnea     Past Surgical History:  Procedure Laterality Date   COLONOSCOPY     KNEE ARTHROSCOPY     PILONIDAL CYST EXCISION     from base of spine   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   Procedure: RIGHT TOTAL KNEE ARTHROPLASTY;  Surgeon: Melodi Lerner, MD;  Location: [facility];  Service: Orthopedics;  Laterality: Right;    Current Medications: Active Medications[1]  Allergies:   No known allergies   Social History   Socioeconomic History   Marital status: Married    Spouse name: Not on file   Number of children: 2   Years of education: Not on  file   Highest education level: Not on file  Occupational History   Occupation: makes cutting dyes  Tobacco Use   Smoking status: Never   Smokeless tobacco: Never  Vaping Use   Vaping status: Never Used  Substance and Sexual Activity   Alcohol use: Yes    Alcohol/week: 1.0 standard drink of alcohol    Types: 1 Cans of beer per week    Comment: occasional   Drug use: No   Sexual activity: Not Currently    Partners: Female    Birth control/protection: Post-menopausal    Comment: hasn't been active in several years  Other Topics Concern   Not on file  Social History Narrative   ** Merged History Encounter **       Social Drivers of Health   Tobacco Use: Low Risk (12/30/2024)   Patient History    Smoking Tobacco Use: Never    Smokeless Tobacco Use: Never    Passive Exposure:  Not on file  Financial Resource Strain: Not on file  Food Insecurity: Not on file  Transportation Needs: Not on file  Physical Activity: Not on file  Stress: Not on file  Social Connections: Not on file  Depression (PHQ2-9): Low Risk (12/16/2023)   Depression (PHQ2-9)    PHQ-2 Score: 0  Alcohol Screen: Not on file  Housing: Unknown (08/26/2024)   Received [facility]   Epic    Unable to Pay for Housing in the Last Year: Not on file    Number of Times Moved in the Last Year: Not on file    At any time in the past 12 months, were you homeless or living in a shelter (including now)?: No  Utilities: Not on file  Health Literacy: Not on file     Family History:  The patient's family history includes CAD (age of onset: 78) in his brother; COPD in his brother; Heart attack (age of onset: 55) in his father; Lung cancer in his sister; Stroke in his brother.   ROS:   Please see the history of present illness.    ROS All other systems reviewed and are negative.     12/29/2024    7:49 PM 12/17/2024    5:01 PM 10/29/2024    4:02 PM 05/17/2024    9:36 PM 04/07/2024    1:06 PM  PAD Screen  Previous PAD dx? No  No  No  Yes  No   Previous surgical procedure? No  No  No  No  No   Pain with walking? Yes  Yes  Yes  Yes  Yes   Subsides with rest? Yes  Yes  Yes  Yes  Yes   Feet/toe relief with dangling? Yes  Yes  Yes  No  No   Painful, non-healing ulcers? No  No  No  No  No   Extremities discolored? Yes  Yes  Yes  Yes  No      Manually entered by patient       PHYSICAL EXAM:   VS:  BP (!) 118/58   Pulse 82   Ht 6' (1.829 m)   Wt 241 lb 12.8 oz (109.7 kg)   SpO2 97%   BMI 32.79 kg/m    GEN: Well nourished, well developed, in no acute distress  HEENT: normal  Neck: no JVD, carotid bruits, or masses Cardiac: RRR; no murmurs, rubs, or gallops,no edema.  Intact distal pulses bilaterally.  Respiratory:  clear to auscultation bilaterally, normal  work of breathing GI:  soft, nontender, nondistended, + BS MS: no deformity or atrophy  Skin: warm and dry, no rash Neuro:  Alert and Oriented x 3, Strength and sensation are intact Psych: euthymic mood, full affect  Wt Readings from Last 3 Encounters:  12/30/24 241 lb 12.8 oz (109.7 kg)  05/18/24 240 lb (108.9 kg)  04/10/24 243 lb (110.2 kg)      Studies/Labs Reviewed:   PAP compliance download, split-night sleep study  Recent Labs: 03/31/2024: Magnesium 2.2; TSH 7.907 04/01/2024: ALT 24; BUN 15; Creatinine, Ser 1.20; Hemoglobin 14.0; Platelets 245; Potassium 4.1; Sodium 139    ASSESSMENT:    1. OSA on CPAP   2. Essential hypertension      PLAN:  In order of problems listed above:  OSA - The patient is tolerating PAP therapy well without any problems. The PAP download performed by his DME was personally reviewed and interpreted by me today and showed an AHI of 0.3 /hr on 12 cm H2O with 100% compliance in using more than 4 hours nightly.  The patient has been using and benefiting from PAP use and will continue to benefit from therapy.   Hypertension - BP controlled on exam today - Continue olmesartan  20 mg daily with as needed refills - I have personally reviewed and interpreted outside labs performed by patient's PCP which showed serum creatinine 1.29 on 10/23/2024 and potassium 5.6 on 12/23/2024   Time Spent: 20 minutes total time of encounter, including 15 minutes spent in face-to-face patient care on the date of this encounter. This time includes coordination of care and counseling regarding above mentioned problem list. Remainder of non-face-to-face time involved reviewing chart documents/testing relevant to the patient encounter and documentation in the medical record. I have independently reviewed documentation from referring provider  Medication Adjustments/Labs and Tests Ordered: Current medicines are reviewed at length with the patient today.  Concerns regarding medicines are outlined  above.  Medication changes, Labs and Tests ordered today are listed in the Patient Instructions below.  There are no Patient Instructions on file for this visit.   Signed, Wilbert Bihari, MD  12/30/2024 2:41 PM    [facility] [address] [place], [place], [place]  [address] Phone: [phone]; Fax: [phone]      [1]  Current Meds  Medication Sig   acetaminophen  (TYLENOL ) 500 MG tablet Take 1,000 mg by mouth every 6 (six) hours as needed for mild pain (pain score 1-3) or headache.   ascorbic acid (VITAMIN C) 1000 MG tablet Take 1,000 mg by mouth daily.   aspirin  (ECOTRIN LOW STRENGTH) 81 MG EC tablet Take 1 tablet daily   cetirizine (ZYRTEC) 10 MG tablet Take 10 mg by mouth daily.   Cholecalciferol 250 MCG (10000 UT) CAPS Take 1 capsule by mouth daily. Take one capsule daily Mon-Fri.   clopidogrel  (PLAVIX ) 75 MG tablet Take 1 tablet (75 mg total) by mouth daily.   Cyanocobalamin (B-12) 500 MCG SUBL Place 1 tablet under the tongue daily.   glimepiride  (AMARYL ) 4 MG tablet Take 1 tablet (4 mg total) by mouth daily with breakfast.   ipratropium (ATROVENT ) 0.06 % nasal spray Use 1 to 2 sprays each nostril 2 to 3 times day as needed   Magnesium 400 MG CAPS Take 1 capsule by mouth daily.   metFORMIN  (GLUCOPHAGE -XR) 500 MG 24 hr tablet Take 2 tablets (1,000 mg total) by mouth 2 (two) times daily with a meal.   Multiple Vitamins-Minerals (  MULTI FOR HIM) TABS Take 1 tablet by mouth daily at 6 (six) AM.   olmesartan  (BENICAR ) 20 MG tablet Take 1 tablet (20 mg total) by mouth daily.   Omega-3 Fatty Acids (FISH OIL) 1000 MG CAPS Take 1 capsule by mouth daily.   rosuvastatin  (CRESTOR ) 10 MG tablet Take 1 tablet (10 mg total) by mouth daily for cholesterol   tirzepatide  (MOUNJARO ) 2.5 MG/0.5ML Pen Inject 2.5 mg into the skin once a week.   Zinc 50 MG TABS Take by mouth daily.   "

## 2024-12-30 NOTE — Patient Instructions (Signed)
 Medication Instructions:  Your physician recommends that you continue on your current medications as directed. Please refer to the Current Medication list given to you today.  *If you need a refill on your cardiac medications before your next appointment, please call your pharmacy*  Lab Work: NONE  If you have labs (blood work) drawn today and your tests are completely normal, you will receive your results only by: MyChart Message (if you have MyChart) OR A paper copy in the mail If you have any lab test that is abnormal or we need to change your treatment, we will call you to review the results.  Testing/Procedures: NONE   Follow-Up: At [facility], you and your health needs are our priority.  As part of our continuing mission to provide you with exceptional heart care, our providers are all part of one team.  This team includes your primary Cardiologist (physician) and Advanced Practice Providers or APPs (Physician Assistants and Nurse Practitioners) who all work together to provide you with the care you need, when you need it.  Your next appointment:   1 year(s)  Provider:   Wilbert Bihari, MD     Other Instructions

## 2025-01-01 ENCOUNTER — Other Ambulatory Visit (HOSPITAL_COMMUNITY): Payer: Self-pay

## 2025-01-01 MED ORDER — MOUNJARO 5 MG/0.5ML ~~LOC~~ SOAJ
5.0000 mg | SUBCUTANEOUS | 0 refills | Status: AC
  Filled 2025-01-01: qty 2, 28d supply, fill #0

## 2025-01-02 ENCOUNTER — Other Ambulatory Visit (HOSPITAL_COMMUNITY): Payer: Self-pay
